# Patient Record
Sex: Female | Born: 1955 | Race: White | Hispanic: No | Smoking: Former smoker
Health system: Southern US, Community
[De-identification: ages and names within clinical notes are randomized; demographics above are authoritative.]

## PROBLEM LIST (undated history)

## (undated) DIAGNOSIS — Z87442 Personal history of urinary calculi: Secondary | ICD-10-CM

## (undated) DIAGNOSIS — R011 Cardiac murmur, unspecified: Secondary | ICD-10-CM

## (undated) DIAGNOSIS — M810 Age-related osteoporosis without current pathological fracture: Secondary | ICD-10-CM

## (undated) DIAGNOSIS — E785 Hyperlipidemia, unspecified: Secondary | ICD-10-CM

## (undated) DIAGNOSIS — R7301 Impaired fasting glucose: Secondary | ICD-10-CM

## (undated) DIAGNOSIS — I10 Essential (primary) hypertension: Secondary | ICD-10-CM

## (undated) DIAGNOSIS — M1990 Unspecified osteoarthritis, unspecified site: Secondary | ICD-10-CM

## (undated) DIAGNOSIS — Z800 Family history of malignant neoplasm of digestive organs: Secondary | ICD-10-CM

## (undated) DIAGNOSIS — F419 Anxiety disorder, unspecified: Secondary | ICD-10-CM

## (undated) DIAGNOSIS — Z85828 Personal history of other malignant neoplasm of skin: Secondary | ICD-10-CM

## (undated) DIAGNOSIS — C801 Malignant (primary) neoplasm, unspecified: Secondary | ICD-10-CM

## (undated) DIAGNOSIS — Z803 Family history of malignant neoplasm of breast: Secondary | ICD-10-CM

## (undated) DIAGNOSIS — R7303 Prediabetes: Secondary | ICD-10-CM

## (undated) DIAGNOSIS — Z973 Presence of spectacles and contact lenses: Secondary | ICD-10-CM

## (undated) DIAGNOSIS — Z801 Family history of malignant neoplasm of trachea, bronchus and lung: Secondary | ICD-10-CM

## (undated) DIAGNOSIS — N6091 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: Secondary | ICD-10-CM

## (undated) DIAGNOSIS — Z972 Presence of dental prosthetic device (complete) (partial): Secondary | ICD-10-CM

## (undated) DIAGNOSIS — Z808 Family history of malignant neoplasm of other organs or systems: Secondary | ICD-10-CM

## (undated) DIAGNOSIS — K219 Gastro-esophageal reflux disease without esophagitis: Secondary | ICD-10-CM

## (undated) DIAGNOSIS — Z8049 Family history of malignant neoplasm of other genital organs: Secondary | ICD-10-CM

## (undated) DIAGNOSIS — F329 Major depressive disorder, single episode, unspecified: Secondary | ICD-10-CM

## (undated) DIAGNOSIS — F32A Depression, unspecified: Secondary | ICD-10-CM

## (undated) DIAGNOSIS — T7840XA Allergy, unspecified, initial encounter: Secondary | ICD-10-CM

## (undated) HISTORY — DX: Family history of malignant neoplasm of other organs or systems: Z80.8

## (undated) HISTORY — DX: Gastro-esophageal reflux disease without esophagitis: K21.9

## (undated) HISTORY — DX: Major depressive disorder, single episode, unspecified: F32.9

## (undated) HISTORY — DX: Cardiac murmur, unspecified: R01.1

## (undated) HISTORY — DX: Depression, unspecified: F32.A

## (undated) HISTORY — DX: Personal history of other malignant neoplasm of skin: Z85.828

## (undated) HISTORY — PX: KNEE SURGERY: SHX244

## (undated) HISTORY — DX: Family history of malignant neoplasm of digestive organs: Z80.0

## (undated) HISTORY — PX: ABDOMINAL HYSTERECTOMY: SHX81

## (undated) HISTORY — DX: Family history of malignant neoplasm of breast: Z80.3

## (undated) HISTORY — PX: COLONOSCOPY: SHX174

## (undated) HISTORY — DX: Family history of malignant neoplasm of other genital organs: Z80.49

## (undated) HISTORY — DX: Essential (primary) hypertension: I10

## (undated) HISTORY — DX: Age-related osteoporosis without current pathological fracture: M81.0

## (undated) HISTORY — DX: Family history of malignant neoplasm of trachea, bronchus and lung: Z80.1

## (undated) HISTORY — DX: Anxiety disorder, unspecified: F41.9

## (undated) HISTORY — DX: Hyperlipidemia, unspecified: E78.5

## (undated) HISTORY — DX: Malignant (primary) neoplasm, unspecified: C80.1

## (undated) HISTORY — PX: ROTATOR CUFF REPAIR: SHX139

## (undated) HISTORY — DX: Allergy, unspecified, initial encounter: T78.40XA

---

## 2000-04-11 HISTORY — PX: OTHER SURGICAL HISTORY: SHX169

## 2003-04-12 HISTORY — PX: OTHER SURGICAL HISTORY: SHX169

## 2004-03-31 ENCOUNTER — Ambulatory Visit (HOSPITAL_COMMUNITY): Admission: RE | Admit: 2004-03-31 | Discharge: 2004-03-31 | Payer: Self-pay | Admitting: Neurosurgery

## 2004-11-10 ENCOUNTER — Ambulatory Visit: Payer: Self-pay | Admitting: Cardiology

## 2004-11-12 ENCOUNTER — Inpatient Hospital Stay (HOSPITAL_BASED_OUTPATIENT_CLINIC_OR_DEPARTMENT_OTHER): Admission: RE | Admit: 2004-11-12 | Discharge: 2004-11-12 | Payer: Self-pay | Admitting: Cardiology

## 2004-11-12 ENCOUNTER — Ambulatory Visit: Payer: Self-pay | Admitting: Cardiology

## 2004-11-18 ENCOUNTER — Ambulatory Visit: Payer: Self-pay | Admitting: Cardiology

## 2005-07-22 ENCOUNTER — Other Ambulatory Visit: Admission: RE | Admit: 2005-07-22 | Discharge: 2005-07-22 | Payer: Self-pay | Admitting: Family Medicine

## 2005-12-16 ENCOUNTER — Encounter: Admission: RE | Admit: 2005-12-16 | Discharge: 2005-12-16 | Payer: Self-pay | Admitting: Neurosurgery

## 2007-02-20 ENCOUNTER — Ambulatory Visit: Payer: Self-pay | Admitting: Gastroenterology

## 2007-03-05 ENCOUNTER — Ambulatory Visit: Payer: Self-pay | Admitting: Gastroenterology

## 2007-03-05 ENCOUNTER — Encounter: Payer: Self-pay | Admitting: Gastroenterology

## 2007-08-14 ENCOUNTER — Ambulatory Visit: Payer: Self-pay | Admitting: Cardiology

## 2008-04-11 HISTORY — PX: LAPAROSCOPIC GASTRIC BANDING: SHX1100

## 2008-04-21 ENCOUNTER — Ambulatory Visit (HOSPITAL_COMMUNITY): Admission: RE | Admit: 2008-04-21 | Discharge: 2008-04-21 | Payer: Self-pay | Admitting: *Deleted

## 2008-05-05 ENCOUNTER — Ambulatory Visit (HOSPITAL_COMMUNITY): Admission: RE | Admit: 2008-05-05 | Discharge: 2008-05-05 | Payer: Self-pay | Admitting: *Deleted

## 2008-05-22 ENCOUNTER — Encounter: Admission: RE | Admit: 2008-05-22 | Discharge: 2008-08-20 | Payer: Self-pay | Admitting: *Deleted

## 2008-07-01 ENCOUNTER — Ambulatory Visit (HOSPITAL_COMMUNITY): Admission: RE | Admit: 2008-07-01 | Discharge: 2008-07-01 | Payer: Self-pay | Admitting: *Deleted

## 2008-08-20 ENCOUNTER — Encounter: Admission: RE | Admit: 2008-08-20 | Discharge: 2008-08-20 | Payer: Self-pay | Admitting: Surgery

## 2010-06-02 ENCOUNTER — Other Ambulatory Visit: Payer: Self-pay | Admitting: Neurosurgery

## 2010-06-02 DIAGNOSIS — M549 Dorsalgia, unspecified: Secondary | ICD-10-CM

## 2010-06-02 DIAGNOSIS — M542 Cervicalgia: Secondary | ICD-10-CM

## 2010-06-06 ENCOUNTER — Ambulatory Visit
Admission: RE | Admit: 2010-06-06 | Discharge: 2010-06-06 | Disposition: A | Discharge status: Home / Self Care | Payer: BC Managed Care – PPO | Source: Ambulatory Visit | Attending: Neurosurgery | Admitting: Neurosurgery

## 2010-06-06 DIAGNOSIS — M542 Cervicalgia: Secondary | ICD-10-CM

## 2010-06-06 DIAGNOSIS — M549 Dorsalgia, unspecified: Secondary | ICD-10-CM

## 2010-07-08 ENCOUNTER — Ambulatory Visit: Payer: BC Managed Care – PPO | Attending: Neurosurgery | Admitting: Physical Therapy

## 2010-07-08 DIAGNOSIS — M542 Cervicalgia: Secondary | ICD-10-CM | POA: Insufficient documentation

## 2010-07-08 DIAGNOSIS — M2569 Stiffness of other specified joint, not elsewhere classified: Secondary | ICD-10-CM | POA: Insufficient documentation

## 2010-07-08 DIAGNOSIS — IMO0001 Reserved for inherently not codable concepts without codable children: Secondary | ICD-10-CM | POA: Insufficient documentation

## 2010-07-08 DIAGNOSIS — R5381 Other malaise: Secondary | ICD-10-CM | POA: Insufficient documentation

## 2010-07-08 DIAGNOSIS — M25519 Pain in unspecified shoulder: Secondary | ICD-10-CM | POA: Insufficient documentation

## 2010-07-13 ENCOUNTER — Ambulatory Visit: Payer: BC Managed Care – PPO | Attending: Neurosurgery | Admitting: *Deleted

## 2010-07-13 DIAGNOSIS — M25519 Pain in unspecified shoulder: Secondary | ICD-10-CM | POA: Insufficient documentation

## 2010-07-13 DIAGNOSIS — R5381 Other malaise: Secondary | ICD-10-CM | POA: Insufficient documentation

## 2010-07-13 DIAGNOSIS — IMO0001 Reserved for inherently not codable concepts without codable children: Secondary | ICD-10-CM | POA: Insufficient documentation

## 2010-07-13 DIAGNOSIS — M2569 Stiffness of other specified joint, not elsewhere classified: Secondary | ICD-10-CM | POA: Insufficient documentation

## 2010-07-13 DIAGNOSIS — M542 Cervicalgia: Secondary | ICD-10-CM | POA: Insufficient documentation

## 2010-07-15 ENCOUNTER — Ambulatory Visit: Payer: BC Managed Care – PPO | Admitting: Physical Therapy

## 2010-07-22 LAB — CBC
HCT: 37.7 % (ref 36.0–46.0)
Hemoglobin: 12.9 g/dL (ref 12.0–15.0)
MCHC: 34.3 g/dL (ref 30.0–36.0)
MCV: 91.9 fL (ref 78.0–100.0)
Platelets: 235 10*3/uL (ref 150–400)
RBC: 4.1 MIL/uL (ref 3.87–5.11)
RDW: 12.9 % (ref 11.5–15.5)
WBC: 5.2 10*3/uL (ref 4.0–10.5)

## 2010-07-22 LAB — DIFFERENTIAL
Basophils Absolute: 0 10*3/uL (ref 0.0–0.1)
Basophils Relative: 0 % (ref 0–1)
Eosinophils Absolute: 0 10*3/uL (ref 0.0–0.7)
Eosinophils Relative: 1 % (ref 0–5)
Lymphocytes Relative: 29 % (ref 12–46)
Lymphs Abs: 1.5 10*3/uL (ref 0.7–4.0)
Monocytes Absolute: 0.5 10*3/uL (ref 0.1–1.0)
Monocytes Relative: 10 % (ref 3–12)
Neutro Abs: 3.1 10*3/uL (ref 1.7–7.7)
Neutrophils Relative %: 60 % (ref 43–77)

## 2010-07-22 LAB — COMPREHENSIVE METABOLIC PANEL
ALT: 33 U/L (ref 0–35)
AST: 24 U/L (ref 0–37)
Albumin: 3 g/dL — ABNORMAL LOW (ref 3.5–5.2)
Alkaline Phosphatase: 70 U/L (ref 39–117)
BUN: 24 mg/dL — ABNORMAL HIGH (ref 6–23)
CO2: 30 mEq/L (ref 19–32)
Calcium: 9.1 mg/dL (ref 8.4–10.5)
Chloride: 104 mEq/L (ref 96–112)
Creatinine, Ser: 0.5 mg/dL (ref 0.4–1.2)
GFR calc Af Amer: >60 mL/min (ref >60)
GFR calc non Af Amer: >60 mL/min (ref >60)
Glucose, Bld: 124 mg/dL — ABNORMAL HIGH (ref 70–99)
Potassium: 3.3 mEq/L — ABNORMAL LOW (ref 3.5–5.1)
Sodium: 141 mEq/L (ref 135–145)
Total Bilirubin: 0.6 mg/dL (ref 0.3–1.2)
Total Protein: 6.9 g/dL (ref 6.0–8.3)

## 2010-07-22 LAB — HEMOGLOBIN AND HEMATOCRIT, BLOOD
HCT: 38.2 % (ref 36.0–46.0)
Hemoglobin: 12.7 g/dL (ref 12.0–15.0)

## 2010-08-24 NOTE — Op Note (Signed)
NAME:  Bridget Conway, Bridget Conway                 ACCOUNT NO.:  [id number]   MEDICAL RECORD NO.:  [id number]          PATIENT TYPE:  AMB   LOCATION:  DAY                          FACILITY:  [facility]   PHYSICIAN:  Alfonse Ras, MD   DATE OF BIRTH:  1955-09-01   DATE OF PROCEDURE:  DATE OF DISCHARGE:                               OPERATIVE REPORT   PREOPERATIVE DIAGNOSIS:  Medically refractory morbid obesity with a BMI  of 45.   POSTOPERATIVE DIAGNOSIS:  Medically refractory morbid obesity with a BMI  of 45.   PROCEDURE:  Laparoscopic adjustable gastric banding with an APL system  and subcutaneous port placement with mesh.   SURGEON:  Alfonse Ras, MD   ASSISTANT:  Lorne Skeens. Hoxworth, MD   ANESTHESIA:  General.   DESCRIPTION:  After extensive informed consent was granted both in the  office and in the preop holding, the patient was taken to the operating  room and placed in supine position.  The abdomen was prepped and draped  in normal sterile fashion after general anesthesia was induced.  Using  an 11-mm OptiVu trocar in the left upper quadrant, peritoneal access was  obtained under direct vision.  Additional 15-mm and 11-mm trocars were  placed in the right abdomen.  Under direct vision, an 11-mm trocar was  placed in the left paramedian position.  The patient was placed in the  steep head-up position and Nathanson liver retractor was placed in the  subxiphoid region and the left lateral segment of the liver was  retracted anteriorly.  The angle of Hiss was sharply and bluntly  dissected.  Using a pars flaccida technique, the lap band passer was  then placed in a retrogastric position and brought out at the angle of  Hiss.  The sizing balloon was placed down to fill with 15 mL of air and  pulled back.  There was no evidence of hiatal hernia.  An APL band was  then placed into the abdominal cavity.  It was placed in the  retrogastric position using the pars flaccida technique.  It  was placed  with the sizing tube in place and was in good position.  There was a  small amount of anterior fat over the proximal stomach which was removed  using Bovie electrocautery.  Anterior fundoplication was performed with  three interrupted 2-0 Ethilon sutures.  This put the band in good  placement.  The tubing was brought out through the lower right upper  quadrant trocar.  It had been affixed with atrium mesh on the port which  was affixed to the tubing and then placed in a subcutaneous position.  The subcutaneous tissue was closed with running 2-0 Vicryl suture.  Skin  incisions were closed with interrupted 4-0 Vicryl sutures.  Steri-Strips  and sterile dressings were applied.  All incisions had been previously  injected with 0.5 Marcaine.  The patient tolerated the procedure well.  There was minimal blood loss.  She was taken to PACU in good condition.      Alfonse Ras, MD  Electronically Signed  KRE/MEDQ  D:  07/01/2008  T:  07/01/2008  Job:  [id number]

## 2010-08-24 NOTE — Assessment & Plan Note (Signed)
[facility]                          [facility] OFFICE NOTE   ANALUISA, TUDOR                        MRN:          [id number]  DATE:08/14/2007                            DOB:          04-Jun-1955    Bridget Conway is seen for cardiac evaluation.  She was seen last in the  office in 2006.  She had been seen by Dr. Andee Lineman.  In the past she has  had some chest discomfort and there was concern that it might be angina.  She underwent catheterization.  This was done on November 12, 2004.  She  had normal LV function.  She had a very slight, minor irregularities of  the LAD.  It was recommended that she have risk factor modification and  an LDL of 100 or lower.  She has been stable since that time.   The patient mentioned that in the past she had a spider bite on her leg  and received several courses of antibiotics and has had some problems  with cutaneous yeast problems.  She had read that this might play a role  with irregular heart beats.  I discussed this with her and told her that  I was not aware of any information to say that her yeast could cause  heart beat irregularities.  She is noted some very mild localized  anterior chest pain to palpation.  This does not sound cardiac.  She  also has had some mild palpitations.  She describes this as a flip-  flop.  She does not have prolonged palpitations.  There is no syncope  or presyncope.  There is no chest pain.   PAST MEDICAL HISTORY:  Allergies:  SULFA.   Medications:  1. Hydrochlorothiazide 25 mg.  2. Zoloft 50 mg.  3. Aspirin 81 mg.  4. Multivitamin.  5. Fish oil.  6. Potassium 10 mEq.  7. She also uses garlic, cayenne,  and black cohosh.   Other medical problems:  See the list below.   SOCIAL HISTORY:  The patient works as an Environmental health practitioner.  She  stopped smoking 7 years ago.  She is not married.   FAMILY HISTORY:  There is a family history of coronary disease.   REVIEW OF  SYSTEMS:  Other than the HPI, her review of systems is  negative.   PHYSICAL EXAM:  Blood pressure today is 140/90.  She is somewhat anxious  about the visit today.  Her prior pressures have been normal.  She is oriented to person, time and place.  Affect is normal.  HEENT:  No xanthelasma.  She has normal extraocular motion.  There are no carotid bruits.  There is no jugular venous distention.  LUNGS:  Clear.  Respiratory effort is not labored.  CARDIAC:  An S1 with an S2.  There are no clicks or significant murmurs.  ABDOMEN:  Soft but obese.  She has no peripheral edema.  There are no musculoskeletal deformities.   EKG dated Aug 10, 2007, had been sent to [place] as part of the outside  workup.  There is a  question of an RSR prime in V1 with question of  incomplete right bundle branch block.  This is a borderline call.   PROBLEMS:  1. Hypertension, treated.  2. History of cigarette smoking.  Stopped 7 years ago.  3. SULFA allergy.  4. Obesity.  She clearly needs to lose weight.  5. History of a significant spider bite treated in the past with      antibiotics.  6. Question of some cutaneous yeast problems.  7. Localized anterior chest pain which is musculoskeletal.  *8.  A sensation of palpitations feeling like a flip-flop.  I suspect  that this is from PACs.  The patient had a Holter that she wore at  [facility].  He was just turned in yesterday morning and we do  not have the results yet.  I will wait for the result.  I have told the  patient I would call her back on Thursday afternoon, Aug 16, 2007, when I  am back in the [place] office and we have at this Holter reviewed.  In the  meantime, she should go about full activities.  I doubt that there is a  significant abnormality.  We will be back in touch with her.     Bridget Abed, MD, [facility]  Electronically Signed    JDK/MedQ  DD: 08/14/2007  DT: 08/14/2007  Job #: [id number]   cc:   Ernestina Penna, M.D.

## 2010-08-27 NOTE — Cardiovascular Report (Signed)
Bridget Conway, Bridget Conway                 ACCOUNT NO.:  [id number]   MEDICAL RECORD NO.:  [id number]          PATIENT TYPE:  OIB   LOCATION:  6501                         FACILITY:  [facility]   PHYSICIAN:  Arturo Morton. Riley Kill, M.D. [facility] OF BIRTH:  [birth year]   DATE OF PROCEDURE:  11/12/2004  DATE OF DISCHARGE:                              CARDIAC CATHETERIZATION   INDICATIONS:  [name] is a 55 year old who has had chest discomfort.  The  current study was done to assess coronary anatomy.  She was seen by Dr.  Andee Lineman and set up for outpatient catheterization.  Her potassium was low at  3.1  The current study was done to assess coronary anatomy.   PROCEDURES:  1.  Left heart catheterization.  2.  Selective coronary arteriography.  3.  Selective left ventriculography.   DESCRIPTION OF PROCEDURE:  The patient was brought to the catheterization  lab and prepped and draped in the usual fashion.  Because of the size of the patient, there was some difficulty gaining access  to the right femoral artery.  We ended up using a Smart needle.  We were then able to successfully engage,  a 4-French sheath was then placed.  View of the left and right coronaries  were obtained in several angiographic projections.  Central aortic and left ventricular pressures were measured with the  pigtail.  Ventriculography was formed in the RAO projection.  Following  pressure pullback, the pigtail catheter was removed.  The patient was taken to the holding area in satisfactory clinical  condition.   HEMODYNAMIC DATA.:  1.  Central aortic pressure 108/69, mean 88.  2.  Left ventricular pressure 113/13.  3.  No gradient on pullback across the aortic valve.   ANGIOGRAPHIC DATA.:  1.  Ventriculography was formed in the RAO projection.  Overall systolic      function was preserved.  No definite wall motion abnormalities were      identified.  2.  The right coronary artery is a large-caliber vessel providing a  major      posterior descending branch and two small posterolateral branches.  The      RCA and its branches appear to be free of significant disease.  3.  The left main is a fairly large-caliber vessel that appears free of      significant disease.  4.  The left anterior descending artery courses to the apex.  There is a      bend in the LAD.  It provides two diagonal branches and a major septal      perforator.  After the septal perforator, there is in the area of the      bend about a 20% eccentric luminal plaquing.  No areas of high-grade      stenosis are noted.  The distal LAD wraps the apical tip and has a large      diagonal branch distally.  5.  The circumflex consists of predominantly one major marginal branch that      bifurcates distally.  The marginal is free of critical disease.  CONCLUSIONS:  1.  Well-preserved left ventricular function.  2.  Minor irregularity of the left anterior descending coronary artery but      no other areas of high-grade stenosis.   DISPOSITION:  The patient needs low-dose potassium.  She will follow up with  Dr. Andee Lineman in the office [facility].       TDS/MEDQ  D:  11/12/2004  T:  11/13/2004  Job:  [id number]   cc:   Learta Codding, M.D. [facility]  [address]  [address]  [place]  [place] [address]   Paulene Floor, N.P.  [facility]  [phone] [address], [place]

## 2010-08-27 NOTE — Assessment & Plan Note (Signed)
[facility]                          [facility]ICE NOTE   KAREN, HUHTA                        MRN:          [id number]  DATE:08/16/2007                            DOB:          1956/02/15    The patient was seen in the office on Aug 14, 2007.  We have just  received the copy of the Holter monitor sent from [facility].  The study shows sinus rhythm with scattered PACs.  I  will communicate this information to the patient.     Luis Abed, MD, [facility]  Electronically Signed    JDK/MedQ  DD: 08/16/2007  DT: 08/16/2007  Job #: [id number]

## 2010-08-27 NOTE — Op Note (Signed)
Bridget Conway, Bridget Conway                 ACCOUNT NO.:  [id number]   MEDICAL RECORD NO.:  [id number]          PATIENT TYPE:  OIB   LOCATION:  NA                           FACILITY:  [facility]   PHYSICIAN:  Hilda Lias, M.D.   DATE OF BIRTH:  [birth year]   DATE OF PROCEDURE:  03/31/2004  DATE OF DISCHARGE:                                 OPERATIVE REPORT   PREOPERATIVE DIAGNOSIS:  Right carpal tunnel syndrome.   POSTOPERATIVE DIAGNOSIS:  Right carpal tunnel syndrome.   PROCEDURE:  Decompression of the right median nerve.   ANESTHESIA:  IV sedation plus local.   CLINICAL HISTORY:  Ms. Scoggin is being admitted because of weakness of the  right hand associated with numbness.  The nerve conduction test was positive  for carpal tunnel syndrome.  Surgery was advised.  The risks were explained  including the possibility of __________, damage to the nerve, no improvement  whatsoever, need of further surgery.   PROCEDURE:  The patient was taken to the OR and the right arm and hand were  prepped with Betadine.  Drapes were applied.  Infiltration with Xylocaine  was made along the base of the thumb.  Then incision along the base of the  thumb was made through the skin, volar ligament and through a thick carpal  ligament.  Decompression medial and distal was made along the ulnar arches  of the nerve.  Having good decompression, the patient was able to move the  fingers including the thumb.  Hemostasis was done with bipolar.  From then  on, the area was irrigated and closed with nylon.  The patient did well and  she is going to be going home once she is stable, to be followed by me in my  office.       EB/MEDQ  D:  03/31/2004  T:  04/01/2004  Job:  [id number]

## 2011-01-06 ENCOUNTER — Telehealth (INDEPENDENT_AMBULATORY_CARE_PROVIDER_SITE_OTHER): Payer: Self-pay | Admitting: General Surgery

## 2011-01-06 NOTE — Telephone Encounter (Signed)
01/06/11 Recall letter mailed to patient for bariatric surgery follow-up.  Adv pt to call to schedule appt...cef °

## 2011-12-30 ENCOUNTER — Telehealth (INDEPENDENT_AMBULATORY_CARE_PROVIDER_SITE_OTHER): Payer: Self-pay | Admitting: Surgery

## 2011-12-30 NOTE — Telephone Encounter (Signed)
Left message on voicemail stating the patient needs to call [facility] at [phone] to schedule a follow-up appointment....cef °

## 2012-02-10 ENCOUNTER — Encounter: Payer: Self-pay | Admitting: Gastroenterology

## 2012-03-28 ENCOUNTER — Encounter: Payer: Self-pay | Admitting: Gastroenterology

## 2012-04-11 DIAGNOSIS — R7301 Impaired fasting glucose: Secondary | ICD-10-CM

## 2012-04-11 HISTORY — DX: Impaired fasting glucose: R73.01

## 2012-04-26 ENCOUNTER — Ambulatory Visit (AMBULATORY_SURGERY_CENTER): Payer: BC Managed Care – PPO | Admitting: *Deleted

## 2012-04-26 ENCOUNTER — Encounter: Payer: Self-pay | Admitting: Gastroenterology

## 2012-04-26 VITALS — Ht 65.0 in | Wt 280.0 lb

## 2012-04-26 DIAGNOSIS — Z860100 Personal history of colon polyps, unspecified: Secondary | ICD-10-CM

## 2012-04-26 DIAGNOSIS — Z1211 Encounter for screening for malignant neoplasm of colon: Secondary | ICD-10-CM

## 2012-04-26 MED ORDER — PEG-KCL-NACL-NASULF-NA ASC-C 100 G PO SOLR: ORAL | Status: DC

## 2012-05-08 ENCOUNTER — Encounter: Payer: Self-pay | Admitting: Gastroenterology

## 2012-05-08 ENCOUNTER — Ambulatory Visit (AMBULATORY_SURGERY_CENTER): Payer: BC Managed Care – PPO | Admitting: Gastroenterology

## 2012-05-08 VITALS — BP 107/56 | HR 85 | Temp 97.7°F | Resp 19 | Ht 65.0 in | Wt 280.0 lb

## 2012-05-08 DIAGNOSIS — Z86010 Personal history of colonic polyps: Secondary | ICD-10-CM

## 2012-05-08 DIAGNOSIS — K5730 Diverticulosis of large intestine without perforation or abscess without bleeding: Secondary | ICD-10-CM

## 2012-05-08 DIAGNOSIS — Z1211 Encounter for screening for malignant neoplasm of colon: Secondary | ICD-10-CM

## 2012-05-08 MED ORDER — SODIUM CHLORIDE 0.9 % IV SOLN: 500.0000 mL | INTRAVENOUS | Status: DC

## 2012-05-08 NOTE — Progress Notes (Signed)
Patient did not experience any of the following events: a burn prior to discharge; a fall within the facility; wrong site/side/patient/procedure/implant event; or a hospital transfer or hospital admission upon discharge from the facility. (G8907)Patient did not have preoperative order for IV antibiotic SSI prophylaxis. (G8918) ewm 

## 2012-05-08 NOTE — Patient Instructions (Addendum)
YOU HAD AN ENDOSCOPIC PROCEDURE TODAY AT THE [facility]: Refer to the procedure report that was given to you for any specific questions about what was found during the examination.  If the procedure report does not answer your questions, please call your gastroenterologist to clarify.  If you requested that your care partner not be given the details of your procedure findings, then the procedure report has been included in a sealed envelope for you to review at your convenience later.  YOU SHOULD EXPECT: Some feelings of bloating in the abdomen. Passage of more gas than usual.  Walking can help get rid of the air that was put into your GI tract during the procedure and reduce the bloating. If you had a lower endoscopy (such as a colonoscopy or flexible sigmoidoscopy) you may notice spotting of blood in your stool or on the toilet paper. If you underwent a bowel prep for your procedure, then you may not have a normal bowel movement for a few days.  DIET: Your first meal following the procedure should be a light meal and then it is ok to progress to your normal diet.  A half-sandwich or bowl of soup is an example of a good first meal.  Heavy or fried foods are harder to digest and may make you feel nauseous or bloated.  Likewise meals heavy in dairy and vegetables can cause extra gas to form and this can also increase the bloating.  Drink plenty of fluids but you should avoid alcoholic beverages for 24 hours.  ACTIVITY: Your care partner should take you home directly after the procedure.  You should plan to take it easy, moving slowly for the rest of the day.  You can resume normal activity the day after the procedure however you should NOT DRIVE or use heavy machinery for 24 hours (because of the sedation medicines used during the test).    SYMPTOMS TO REPORT IMMEDIATELY: A gastroenterologist can be reached at any hour.  During normal business hours, 8:30 AM to 5:00 PM Monday through Friday,  call [phone].  After hours and on weekends, please call the GI answering service at [phone] emergency number  who will take a message and have the physician on call contact you.   Following lower endoscopy (colonoscopy or flexible sigmoidoscopy):  Excessive amounts of blood in the stool  Significant tenderness or worsening of abdominal pains  Swelling of the abdomen that is new, acute  Fever of 100F or higher  FOLLOW UP: If any biopsies were taken you will be contacted by phone or by letter within the next 1-3 weeks.  Call your gastroenterologist if you have not heard about the biopsies in 3 weeks.  Our staff will call the home number listed on your records the next business day following your procedure to check on you and address any questions or concerns that you may have at that time regarding the information given to you following your procedure. This is a courtesy call and so if there is no answer at the home number and we have not heard from you through the emergency physician on call, we will assume that you have returned to your regular daily activities without incident.  SIGNATURES/CONFIDENTIALITY: You and/or your care partner have signed paperwork which will be entered into your electronic medical record.  These signatures attest to the fact that that the information above on your After Visit Summary has been reviewed and is understood.  Full responsibility of the   confidentiality of this discharge information lies with you and/or your care-partner.   Handouts on diverticulosis and high fiber diet Repeat colon in 10 years

## 2012-05-08 NOTE — Op Note (Signed)
[facility] [address] [place], [address]   COLONOSCOPY PROCEDURE REPORT  PATIENT: Bridget Conway, Bridget Conway.  MR#: [id number] BIRTHDATE: [birth year] , 56  yrs. old GENDER: Female ENDOSCOPIST: Rachael Fee, MD PROCEDURE DATE:  05/08/2012 PROCEDURE:   Colonoscopy, surveillance ASA CLASS:   Class III INDICATIONS: single small adenoma removed 2008. MEDICATIONS: Fentanyl 75 mcg IV, Versed 6 mg IV, and These medications were titrated to patient response per physician's verbal order  DESCRIPTION OF PROCEDURE:   After the risks benefits and alternatives of the procedure were thoroughly explained, informed consent was obtained.  A digital rectal exam revealed no abnormalities of the rectum.   The LB [id number]-[id number]  endoscope was introduced through the anus and advanced to the cecum, which was identified by both the appendix and ileocecal valve. No adverse events experienced.   The quality of the prep was good, using MoviPrep  The instrument was then slowly withdrawn as the colon was fully examined.  COLON FINDINGS: There were a few small diverticulum in left colon. The examination was otherwise normal.  No polyps or cancers. Retroflexed views revealed no abnormalities. The time to cecum=6 minutes 29 seconds.  Withdrawal time=6 minutes 19 seconds.  The scope was withdrawn and the procedure completed. COMPLICATIONS: There were no complications.  ENDOSCOPIC IMPRESSION: There were a few small diverticulum in left colon. The examination was otherwise normal. No polyps or cancers.  RECOMMENDATIONS: You should continue to follow colorectal cancer screening guidelines for "routine risk" patients with a repeat colonoscopy in 10 years. There is no need for FOBT (stool) testing for at least 5 years.   eSigned:  Rachael Fee, MD 05/08/2012 8:43 AM   cc: Rudi Heap, MD

## 2012-05-08 NOTE — Progress Notes (Signed)
The pt tolerated the colonoscopy very well. Maw   

## 2012-05-09 ENCOUNTER — Telehealth: Payer: Self-pay | Admitting: *Deleted

## 2012-05-09 NOTE — Telephone Encounter (Signed)
  Follow up Call-  Call back number 05/08/2012  Post procedure Call Back phone  # [phone]  Permission to leave phone message Yes     Patient questions:  Do you have a fever, pain , or abdominal swelling? no Pain Score  0 *  Have you tolerated food without any problems? yes  Have you been able to return to your normal activities? yes at work per pt.  Do you have any questions about your discharge instructions: Diet   no Medications  no Follow up visit  no  Do you have questions or concerns about your Care? no  Actions: * If pain score is 4 or above: No action needed, pain <4.

## 2013-07-05 ENCOUNTER — Other Ambulatory Visit: Payer: Self-pay | Admitting: Orthopedic Surgery

## 2013-07-09 ENCOUNTER — Encounter (HOSPITAL_BASED_OUTPATIENT_CLINIC_OR_DEPARTMENT_OTHER): Payer: Self-pay | Admitting: *Deleted

## 2013-07-09 NOTE — Progress Notes (Signed)
Pt denies sleep apnea-cannot get labs preop-will need istat and ekg-

## 2013-07-09 NOTE — Progress Notes (Signed)
07/09/13 1357  OBSTRUCTIVE SLEEP APNEA  Have you ever been diagnosed with sleep apnea through a sleep study? No  Do you snore loudly (loud enough to be heard through closed doors)?  0  Do you often feel tired, fatigued, or sleepy during the daytime? 0  Has anyone observed you stop breathing during your sleep? 0  Do you have, or are you being treated for high blood pressure? 1  BMI more than 35 kg/m2? 1  Age over 60 years old? 1  Neck circumference greater than 40 cm/18 inches? 1  Gender: 0  Obstructive Sleep Apnea Score 4  Score 4 or greater  Results sent to PCP

## 2013-07-11 ENCOUNTER — Encounter (HOSPITAL_BASED_OUTPATIENT_CLINIC_OR_DEPARTMENT_OTHER)
Admission: RE | Disposition: A | Discharge status: Home / Self Care | Payer: BC Managed Care – PPO | Source: Ambulatory Visit | Attending: Orthopedic Surgery

## 2013-07-11 ENCOUNTER — Encounter (HOSPITAL_BASED_OUTPATIENT_CLINIC_OR_DEPARTMENT_OTHER): Payer: BC Managed Care – PPO | Admitting: Anesthesiology

## 2013-07-11 ENCOUNTER — Encounter (HOSPITAL_BASED_OUTPATIENT_CLINIC_OR_DEPARTMENT_OTHER): Payer: Self-pay | Admitting: Anesthesiology

## 2013-07-11 ENCOUNTER — Ambulatory Visit (HOSPITAL_BASED_OUTPATIENT_CLINIC_OR_DEPARTMENT_OTHER): Payer: BC Managed Care – PPO | Admitting: Anesthesiology

## 2013-07-11 ENCOUNTER — Ambulatory Visit (HOSPITAL_BASED_OUTPATIENT_CLINIC_OR_DEPARTMENT_OTHER)
Admission: RE | Admit: 2013-07-11 | Discharge: 2013-07-11 | Disposition: A | Discharge status: Home / Self Care | Payer: BC Managed Care – PPO | Source: Ambulatory Visit | Attending: Orthopedic Surgery | Admitting: Orthopedic Surgery

## 2013-07-11 DIAGNOSIS — F3289 Other specified depressive episodes: Secondary | ICD-10-CM | POA: Insufficient documentation

## 2013-07-11 DIAGNOSIS — Z87891 Personal history of nicotine dependence: Secondary | ICD-10-CM | POA: Insufficient documentation

## 2013-07-11 DIAGNOSIS — G5600 Carpal tunnel syndrome, unspecified upper limb: Secondary | ICD-10-CM | POA: Insufficient documentation

## 2013-07-11 DIAGNOSIS — F411 Generalized anxiety disorder: Secondary | ICD-10-CM | POA: Insufficient documentation

## 2013-07-11 DIAGNOSIS — I10 Essential (primary) hypertension: Secondary | ICD-10-CM | POA: Insufficient documentation

## 2013-07-11 DIAGNOSIS — Z85828 Personal history of other malignant neoplasm of skin: Secondary | ICD-10-CM | POA: Insufficient documentation

## 2013-07-11 DIAGNOSIS — F329 Major depressive disorder, single episode, unspecified: Secondary | ICD-10-CM | POA: Insufficient documentation

## 2013-07-11 DIAGNOSIS — K219 Gastro-esophageal reflux disease without esophagitis: Secondary | ICD-10-CM | POA: Insufficient documentation

## 2013-07-11 DIAGNOSIS — E119 Type 2 diabetes mellitus without complications: Secondary | ICD-10-CM | POA: Insufficient documentation

## 2013-07-11 DIAGNOSIS — M810 Age-related osteoporosis without current pathological fracture: Secondary | ICD-10-CM | POA: Insufficient documentation

## 2013-07-11 HISTORY — DX: Presence of spectacles and contact lenses: Z97.3

## 2013-07-11 HISTORY — DX: Presence of dental prosthetic device (complete) (partial): Z97.2

## 2013-07-11 HISTORY — PX: CARPAL TUNNEL RELEASE: SHX101

## 2013-07-11 HISTORY — DX: Impaired fasting glucose: R73.01

## 2013-07-11 LAB — POCT I-STAT, CHEM 8
BUN: 21 mg/dL (ref 6–23)
Calcium, Ion: 1.13 mmol/L (ref 1.13–1.30)
Chloride: 99 mEq/L (ref 96–112)
Creatinine, Ser: 0.7 mg/dL (ref 0.50–1.10)
Glucose, Bld: 115 mg/dL — ABNORMAL HIGH (ref 70–99)
HCT: 43 % (ref 36.0–46.0)
HEMOGLOBIN: 14.6 g/dL (ref 12.0–15.0)
Potassium: 3.3 mEq/L — ABNORMAL LOW (ref 3.7–5.3)
SODIUM: 142 meq/L (ref 137–147)
TCO2: 28 mmol/L (ref 0–100)

## 2013-07-11 SURGERY — CARPAL TUNNEL RELEASE
Anesthesia: Regional | Site: Wrist | Laterality: Left

## 2013-07-11 MED ORDER — FENTANYL CITRATE 0.05 MG/ML IJ SOLN: 25.0000 ug | INTRAMUSCULAR | Status: DC | PRN

## 2013-07-11 MED ORDER — CEFAZOLIN SODIUM-DEXTROSE 2-3 GM-% IV SOLR
INTRAVENOUS | Status: AC
  Filled 2013-07-11: qty 50

## 2013-07-11 MED ORDER — METOCLOPRAMIDE HCL 5 MG/ML IJ SOLN: 10.0000 mg | Freq: Once | INTRAMUSCULAR | Status: DC | PRN

## 2013-07-11 MED ORDER — OXYCODONE HCL 5 MG PO TABS
ORAL_TABLET | ORAL | Status: AC
  Filled 2013-07-11: qty 1

## 2013-07-11 MED ORDER — FENTANYL CITRATE 0.05 MG/ML IJ SOLN: 50.0000 ug | INTRAMUSCULAR | Status: DC | PRN

## 2013-07-11 MED ORDER — METOCLOPRAMIDE HCL 5 MG/ML IJ SOLN
INTRAMUSCULAR | Status: DC | PRN
  Administered 2013-07-11: 10 mg via INTRAVENOUS

## 2013-07-11 MED ORDER — CEFAZOLIN SODIUM 1-5 GM-% IV SOLN
INTRAVENOUS | Status: AC
  Filled 2013-07-11: qty 50

## 2013-07-11 MED ORDER — MIDAZOLAM HCL 2 MG/2ML IJ SOLN
INTRAMUSCULAR | Status: AC
  Filled 2013-07-11: qty 2

## 2013-07-11 MED ORDER — FENTANYL CITRATE 0.05 MG/ML IJ SOLN
INTRAMUSCULAR | Status: DC | PRN
  Administered 2013-07-11: 50 ug via INTRAVENOUS

## 2013-07-11 MED ORDER — DEXTROSE 5 % IV SOLN
3.0000 g | INTRAVENOUS | Status: AC
  Administered 2013-07-11: 3 g via INTRAVENOUS

## 2013-07-11 MED ORDER — OXYCODONE-ACETAMINOPHEN 5-325 MG PO TABS: ORAL_TABLET | ORAL | Status: DC

## 2013-07-11 MED ORDER — OXYCODONE HCL 5 MG/5ML PO SOLN: 5.0000 mg | Freq: Once | ORAL | Status: AC | PRN

## 2013-07-11 MED ORDER — MIDAZOLAM HCL 5 MG/5ML IJ SOLN
INTRAMUSCULAR | Status: DC | PRN
  Administered 2013-07-11: 1 mg via INTRAVENOUS

## 2013-07-11 MED ORDER — MIDAZOLAM HCL 2 MG/2ML IJ SOLN: 1.0000 mg | INTRAMUSCULAR | Status: DC | PRN

## 2013-07-11 MED ORDER — DEXAMETHASONE SODIUM PHOSPHATE 10 MG/ML IJ SOLN
INTRAMUSCULAR | Status: DC | PRN
  Administered 2013-07-11: 10 mg via INTRAVENOUS

## 2013-07-11 MED ORDER — BUPIVACAINE HCL (PF) 0.25 % IJ SOLN
INTRAMUSCULAR | Status: DC | PRN
  Administered 2013-07-11: 10 mL

## 2013-07-11 MED ORDER — FENTANYL CITRATE 0.05 MG/ML IJ SOLN
INTRAMUSCULAR | Status: AC
Start: 2013-07-11 — End: 2013-07-11
  Filled 2013-07-11: qty 4

## 2013-07-11 MED ORDER — BUPIVACAINE HCL (PF) 0.25 % IJ SOLN
INTRAMUSCULAR | Status: AC
  Filled 2013-07-11: qty 30

## 2013-07-11 MED ORDER — LIDOCAINE HCL (CARDIAC) 20 MG/ML IV SOLN
INTRAVENOUS | Status: DC | PRN
  Administered 2013-07-11: 50 mg via INTRAVENOUS

## 2013-07-11 MED ORDER — PROPOFOL 10 MG/ML IV BOLUS
INTRAVENOUS | Status: DC | PRN
  Administered 2013-07-11: 300 mg via INTRAVENOUS

## 2013-07-11 MED ORDER — CHLORHEXIDINE GLUCONATE 4 % EX LIQD: 60.0000 mL | Freq: Once | CUTANEOUS | Status: DC

## 2013-07-11 MED ORDER — ONDANSETRON HCL 4 MG/2ML IJ SOLN
INTRAMUSCULAR | Status: DC | PRN
  Administered 2013-07-11: 4 mg via INTRAVENOUS

## 2013-07-11 MED ORDER — LACTATED RINGERS IV SOLN
INTRAVENOUS | Status: DC
  Administered 2013-07-11: 07:00:00 via INTRAVENOUS

## 2013-07-11 MED ORDER — OXYCODONE HCL 5 MG PO TABS
5.0000 mg | ORAL_TABLET | Freq: Once | ORAL | Status: AC | PRN
  Administered 2013-07-11: 5 mg via ORAL

## 2013-07-11 SURGICAL SUPPLY — 39 items
BANDAGE ELASTIC 3 VELCRO ST LF (GAUZE/BANDAGES/DRESSINGS) ×3 IMPLANT
BLADE MINI RND TIP GREEN BEAV (BLADE) IMPLANT
BLADE SURG 15 STRL LF DISP TIS (BLADE) ×2 IMPLANT
BLADE SURG 15 STRL SS (BLADE) ×6
BNDG CMPR 9X4 STRL LF SNTH (GAUZE/BANDAGES/DRESSINGS) ×1
BNDG ESMARK 4X9 LF (GAUZE/BANDAGES/DRESSINGS) ×2 IMPLANT
BNDG GAUZE ELAST 4 BULKY (GAUZE/BANDAGES/DRESSINGS) ×3 IMPLANT
CHLORAPREP W/TINT 26ML (MISCELLANEOUS) ×3 IMPLANT
CORDS BIPOLAR (ELECTRODE) ×3 IMPLANT
COVER MAYO STAND STRL (DRAPES) ×3 IMPLANT
COVER TABLE BACK 60X90 (DRAPES) ×3 IMPLANT
CUFF TOURNIQUET SINGLE 18IN (TOURNIQUET CUFF) ×1 IMPLANT
CUFF TOURNIQUET SINGLE 24IN (TOURNIQUET CUFF) ×2 IMPLANT
DRAPE EXTREMITY T 121X128X90 (DRAPE) ×3 IMPLANT
DRAPE SURG 17X23 STRL (DRAPES) ×3 IMPLANT
DRSG PAD ABDOMINAL 8X10 ST (GAUZE/BANDAGES/DRESSINGS) ×5 IMPLANT
GAUZE XEROFORM 1X8 LF (GAUZE/BANDAGES/DRESSINGS) ×3 IMPLANT
GLOVE BIO SURGEON STRL SZ7.5 (GLOVE) ×3 IMPLANT
GLOVE BIOGEL PI IND STRL 7.0 (GLOVE) IMPLANT
GLOVE BIOGEL PI IND STRL 8 (GLOVE) ×1 IMPLANT
GLOVE BIOGEL PI INDICATOR 7.0 (GLOVE) ×2
GLOVE BIOGEL PI INDICATOR 8 (GLOVE) ×2
GLOVE ECLIPSE 7.0 STRL STRAW (GLOVE) ×2 IMPLANT
GOWN STRL REUS W/ TWL LRG LVL3 (GOWN DISPOSABLE) ×1 IMPLANT
GOWN STRL REUS W/TWL LRG LVL3 (GOWN DISPOSABLE) ×3
GOWN STRL REUS W/TWL XL LVL3 (GOWN DISPOSABLE) ×3 IMPLANT
NDL HYPO 25X1 1.5 SAFETY (NEEDLE) IMPLANT
NEEDLE HYPO 25X1 1.5 SAFETY (NEEDLE) ×3 IMPLANT
NS IRRIG 1000ML POUR BTL (IV SOLUTION) ×3 IMPLANT
PACK BASIN DAY SURGERY FS (CUSTOM PROCEDURE TRAY) ×3 IMPLANT
PADDING CAST ABS 4INX4YD NS (CAST SUPPLIES) ×2
PADDING CAST ABS COTTON 4X4 ST (CAST SUPPLIES) ×1 IMPLANT
SPONGE GAUZE 4X4 12PLY (GAUZE/BANDAGES/DRESSINGS) ×3 IMPLANT
STOCKINETTE 4X48 STRL (DRAPES) ×3 IMPLANT
SUT ETHILON 4 0 PS 2 18 (SUTURE) ×3 IMPLANT
SYR BULB 3OZ (MISCELLANEOUS) ×3 IMPLANT
SYR CONTROL 10ML LL (SYRINGE) ×2 IMPLANT
TOWEL OR 17X24 6PK STRL BLUE (TOWEL DISPOSABLE) ×6 IMPLANT
UNDERPAD 30X30 INCONTINENT (UNDERPADS AND DIAPERS) ×3 IMPLANT

## 2013-07-11 NOTE — Op Note (Signed)
[id number] 

## 2013-07-11 NOTE — H&P (Signed)
Bridget Conway is an 58 y.o. female.   Chief Complaint: left carpal tunnel syndrome HPI: 58 yo rhd female with pain and numbness left hand x 1 year.  Pins and needles sensation.  Nocturnal symptoms nightly that wake her.  Positive nerve conduction studies.  She wishes to have a left carpal tunnel release.  Past Medical History  Diagnosis Date  . Allergy   . Cancer     basal cell CA- below eye  . Anxiety   . Depression   . Hypertension   . GERD (gastroesophageal reflux disease)   . Osteoporosis   . Wears glasses   . Wears partial dentures     upper partial  . IFG (impaired fasting glucose) 2014    Past Surgical History  Procedure Laterality Date  . Carpel tunnel  2005    right wrist  . Hysterectomy  2002  . Laparoscopic gastric banding  2010  . Colonoscopy      Family History  Problem Relation Age of Onset  . Colon cancer Neg Hx   . Esophageal cancer Neg Hx   . Rectal cancer Neg Hx   . Stomach cancer Neg Hx    Social History:  reports that she quit smoking about 13 years ago. She has never used smokeless tobacco. She reports that she does not drink alcohol or use illicit drugs.  Allergies:  Allergies  Allergen Reactions  . Sulfa Antibiotics Hives    Medications Prior to Admission  Medication Sig Dispense Refill  . Cholecalciferol (VITAMIN D PO) Take 1,000 Units by mouth daily.      . fish oil-omega-3 fatty acids 1000 MG capsule Take 1,200 mg by mouth daily.      . Flaxseed, Linseed, (FLAXSEED OIL PO) Take 2,400 mg by mouth daily.      . fluticasone (FLONASE) 50 MCG/ACT nasal spray       . hydrochlorothiazide (HYDRODIURIL) 25 MG tablet Take 25 mg by mouth daily.      Marland Kitchen MELATONIN ER PO Take by mouth daily.      . meloxicam (MOBIC) 15 MG tablet Take 15 mg by mouth daily.      . metFORMIN (GLUCOPHAGE) 500 MG tablet Take by mouth every evening.      . Multiple Vitamins-Minerals (MULTIVITAMIN PO) Take by mouth daily.      Marland Kitchen POTASSIUM PO Take by mouth daily.      .  Probiotic Product (PROBIOTIC DAILY PO) Take by mouth daily.      Marland Kitchen RANITIDINE HCL PO Take by mouth daily.      . sertraline (ZOLOFT) 50 MG tablet Take 50 mg by mouth daily.        No results found for this or any previous visit (from the past 48 hour(s)).  No results found.   A comprehensive review of systems was negative except for: Eyes: positive for contacts/glasses Behavioral/Psych: positive for depression  Blood pressure 114/82, pulse 76, temperature 97.8 F (36.6 C), temperature source Oral, resp. rate 16, height 5\' 5"  (1.651 m), weight 126.78 kg (279 lb 8 oz), SpO2 96.00%.  General appearance: alert, cooperative and appears stated age Head: Normocephalic, without obvious abnormality, atraumatic Neck: supple, symmetrical, trachea midline Resp: clear to auscultation bilaterally Cardio: regular rate and rhythm GI: non tender Extremities: intact sensation and carpillary refill all digits.  +epl/fpl/io.  no wounds. Pulses: 2+ and symmetric Skin: Skin color, texture, turgor normal. No rashes or lesions Neurologic: Grossly normal Incision/Wound: none  Assessment/Plan Left carpal tunnel  syndrome.  Non operative and operative treatment options were discussed with the patient and patient wishes to proceed with operative treatment. Risks, benefits, and alternatives of surgery were discussed and the patient agrees with the plan of care.   Bridget Conway R 07/11/2013, 8:21 AM

## 2013-07-11 NOTE — Transfer of Care (Signed)
Immediate Anesthesia Transfer of Care Note  Patient: Bridget Conway  Procedure(s) Performed: Procedure(s): LEFT CARPAL TUNNEL RELEASE (Left)  Patient Location: PACU  Anesthesia Type:General  Level of Consciousness: awake, alert  and oriented  Airway & Oxygen Therapy: Patient Spontanous Breathing and Patient connected to face mask oxygen  Post-op Assessment: Report given to PACU RN and Post -op Vital signs reviewed and stable  Post vital signs: Reviewed and stable  Complications: No apparent anesthesia complications

## 2013-07-11 NOTE — Brief Op Note (Signed)
07/11/2013  9:40 AM  PATIENT:  [name]  58 y.o. female  PRE-OPERATIVE DIAGNOSIS:  LEFT CARPAL TUNNEL SYNDROME  POST-OPERATIVE DIAGNOSIS:  LEFT CARPAL TUNNEL SYNDROME  PROCEDURE:  Procedure(s): LEFT CARPAL TUNNEL RELEASE (Left)  SURGEON:  Surgeon(s) and Role:    * Tennis Must, MD - Primary  PHYSICIAN ASSISTANT:   ASSISTANTS: none   ANESTHESIA:   general  EBL:  Total I/O In: 1000 [I.V.:1000] Out: -   BLOOD ADMINISTERED:none  DRAINS: none   LOCAL MEDICATIONS USED:  MARCAINE     SPECIMEN:  No Specimen  DISPOSITION OF SPECIMEN:  N/A  COUNTS:  YES  TOURNIQUET:   Total Tourniquet Time Documented: Upper Arm (Left) - 14 minutes Total: Upper Arm (Left) - 14 minutes   DICTATION: .Other Dictation: Dictation Number [id number]  PLAN OF CARE: Discharge to home after PACU  PATIENT DISPOSITION:  PACU - hemodynamically stable.

## 2013-07-11 NOTE — Anesthesia Preprocedure Evaluation (Signed)
Anesthesia Evaluation  Patient identified by MRN, date of birth, ID band Patient awake    Reviewed: Allergy & Precautions, H&P , NPO status , Patient's Chart, lab work & pertinent test results, reviewed documented beta blocker date and time   Airway Mallampati: II TM Distance: >3 FB Neck ROM: full    Dental   Pulmonary neg pulmonary ROS, former smoker,  breath sounds clear to auscultation        Cardiovascular hypertension, On Medications Rhythm:regular     Neuro/Psych PSYCHIATRIC DISORDERS Anxiety Depression negative neurological ROS     GI/Hepatic Neg liver ROS, GERD-  Medicated,  Endo/Other  diabetes, Oral Hypoglycemic AgentsMorbid obesity  Renal/GU negative Renal ROS  negative genitourinary   Musculoskeletal   Abdominal   Peds  Hematology negative hematology ROS (+)   Anesthesia Other Findings See surgeon's H&P   Reproductive/Obstetrics negative OB ROS                           Anesthesia Physical Anesthesia Plan  ASA: III  Anesthesia Plan: General and Bier Block   Post-op Pain Management:    Induction: Intravenous  Airway Management Planned: Mask and LMA  Additional Equipment:   Intra-op Plan:   Post-operative Plan:   Informed Consent: I have reviewed the patients History and Physical, chart, labs and discussed the procedure including the risks, benefits and alternatives for the proposed anesthesia with the patient or authorized representative who has indicated his/her understanding and acceptance.   Dental Advisory Given  Plan Discussed with: CRNA and Surgeon  Anesthesia Plan Comments:         Anesthesia Quick Evaluation

## 2013-07-11 NOTE — Op Note (Signed)
Bridget Conway, Bridget Conway                 ACCOUNT NO.:  [id number]  MEDICAL RECORD NO.:  [id number]  LOCATION:                                 FACILITY:  PHYSICIAN:  Leanora Cover, MD             DATE OF BIRTH:  DATE OF PROCEDURE:  07/11/2013 DATE OF DISCHARGE:                              OPERATIVE REPORT   PREOPERATIVE DIAGNOSIS:  Left carpal tunnel syndrome.  POSTOPERATIVE DIAGNOSIS:  Left carpal tunnel syndrome.  PROCEDURE:  Left carpal tunnel release.  SURGEON:  Leanora Cover, MD.  ASSISTANT:  None.  ANESTHESIA:  General.  IV FLUIDS:  Per anesthesia flow sheet.  ESTIMATED BLOOD LOSS:  Minimal.  COMPLICATIONS:  None.  SPECIMENS:  None.  TOURNIQUET TIME:  14 minutes.  DISPOSITION:  Stable to PACU.  INDICATIONS:  Bridget Conway is a 58 year old female who has had pain, pins and needle sensation in the left hand.  This wakes her at night.  She wished to have a carpal tunnel release for management of symptoms.  She has positive nerve conduction studies.  Risks, benefits, alternatives of surgery were discussed including risk of blood loss, infection; damage to nerves, vessels, tendons, ligaments, bone; failure of surgery; need for additional surgery, complications with wound healing, continued pain, and recurrent carpal tunnel syndrome.  She voiced understanding of these risks and elected to proceed.  OPERATIVE COURSE:  After being identified preoperatively by myself, the patient and I agreed upon procedure and site of procedure.  Surgical site was marked.  The risks, benefits, and alternatives of surgery were reviewed and she wished to proceed.  Surgical consent had been signed. She was given IV Ancef as preoperative antibiotic prophylaxis.  She was transferred to the operating room and placed on the operating room table in supine position with the left upper extremity on arm board.  General anesthesia was induced by Anesthesiology.  Left upper extremity was prepped and draped  in normal sterile orthopedic fashion.  Surgical pause performed between surgeons, anesthesia, operating staff, and all were in agreement as to the patient, procedure, and site of procedure. Tourniquet at the proximal aspect of the extremity was inflated to 250 mmHg after exsanguination of the limb with an Esmarch bandage.  Incision was made over the transverse carpal ligament and carried into subcutaneous tissues by spreading technique.  Bipolar electrocautery was used to obtain hemostasis.  The palmar fascia was incised.  Transverse carpal ligament was identified.  It was incised sharply distally first. Care was taken to ensure complete decompression distally.  It was then incised proximally.  Scissors were used to split the distal aspect of the volar antebrachial fascia.  Finger was placed into the wound to ensure complete decompression which was the case.  The nerve was inspected.  It was flattened and adherent to the radial leaflet.  It was freed up.  The motor branch was identified and was intact.  The wound was copiously irrigated with sterile saline.  It was then closed with 4- 0 nylon in a horizontal mattress fashion.  The wound was injected with 10 mL of 0.25% plain Marcaine to aid in postoperative analgesia.  It was then dressed with sterile Xeroform, 4x4s, and ABD and wrapped with Kerlix and Ace bandage.  Tourniquet was deflated at 14 minutes. Fingertips were pink with brisk capillary refill after deflation of tourniquet.  Operative drapes were broken down.  The patient was awoken from anesthesia safely.  She was transferred back to stretcher and taken to PACU in stable condition.  I will see her back in the office in 1 week for postoperative followup.  I will give her Percocet 5/325, 1-2 p.o. q.6 hours p.r.n. pain, dispensed #30.     Leanora Cover, MD     KK/MEDQ  D:  07/11/2013  T:  07/11/2013  Job:  [id number]

## 2013-07-11 NOTE — Anesthesia Procedure Notes (Signed)
Procedure Name: LMA Insertion Date/Time: 07/11/2013 9:00 AM Performed by: Melynda Ripple D Pre-anesthesia Checklist: Patient identified, Emergency Drugs available, Suction available and Patient being monitored Patient Re-evaluated:Patient Re-evaluated prior to inductionOxygen Delivery Method: Circle System Utilized Preoxygenation: Pre-oxygenation with 100% oxygen Intubation Type: IV induction Ventilation: Mask ventilation without difficulty LMA: LMA inserted LMA Size: 4.0 Number of attempts: 1 Airway Equipment and Method: bite block Placement Confirmation: positive ETCO2 Tube secured with: Tape Dental Injury: Teeth and Oropharynx as per pre-operative assessment

## 2013-07-11 NOTE — Discharge Instructions (Addendum)

## 2013-07-11 NOTE — Anesthesia Postprocedure Evaluation (Signed)
Anesthesia Post Note  Patient: Bridget Conway  Procedure(s) Performed: Procedure(s) (LRB): LEFT CARPAL TUNNEL RELEASE (Left)  Anesthesia type: General  Patient location: PACU  Post pain: Pain level controlled  Post assessment: Patient's Cardiovascular Status Stable  Last Vitals:  Filed Vitals:   07/11/13 0930  BP: 117/74  Pulse: 86  Temp: 36.5 C  Resp: 12    Post vital signs: Reviewed and stable  Level of consciousness: alert  Complications: No apparent anesthesia complications

## 2013-07-16 ENCOUNTER — Encounter (HOSPITAL_BASED_OUTPATIENT_CLINIC_OR_DEPARTMENT_OTHER): Payer: Self-pay | Admitting: Orthopedic Surgery

## 2013-12-19 ENCOUNTER — Encounter: Payer: Self-pay | Admitting: Nurse Practitioner

## 2013-12-19 ENCOUNTER — Ambulatory Visit (INDEPENDENT_AMBULATORY_CARE_PROVIDER_SITE_OTHER): Payer: BC Managed Care – PPO | Admitting: Nurse Practitioner

## 2013-12-19 VITALS — BP 141/73 | HR 99 | Temp 98.5°F | Ht 65.0 in | Wt 289.4 lb

## 2013-12-19 DIAGNOSIS — I159 Secondary hypertension, unspecified: Secondary | ICD-10-CM

## 2013-12-19 DIAGNOSIS — I158 Other secondary hypertension: Secondary | ICD-10-CM

## 2013-12-19 DIAGNOSIS — R0989 Other specified symptoms and signs involving the circulatory and respiratory systems: Secondary | ICD-10-CM

## 2013-12-19 DIAGNOSIS — F3289 Other specified depressive episodes: Secondary | ICD-10-CM

## 2013-12-19 DIAGNOSIS — F329 Major depressive disorder, single episode, unspecified: Secondary | ICD-10-CM

## 2013-12-19 DIAGNOSIS — R0609 Other forms of dyspnea: Secondary | ICD-10-CM

## 2013-12-19 DIAGNOSIS — R739 Hyperglycemia, unspecified: Secondary | ICD-10-CM

## 2013-12-19 DIAGNOSIS — F32A Depression, unspecified: Secondary | ICD-10-CM | POA: Insufficient documentation

## 2013-12-19 DIAGNOSIS — I10 Essential (primary) hypertension: Secondary | ICD-10-CM

## 2013-12-19 DIAGNOSIS — K219 Gastro-esophageal reflux disease without esophagitis: Secondary | ICD-10-CM

## 2013-12-19 DIAGNOSIS — E876 Hypokalemia: Secondary | ICD-10-CM | POA: Insufficient documentation

## 2013-12-19 DIAGNOSIS — I119 Hypertensive heart disease without heart failure: Secondary | ICD-10-CM | POA: Insufficient documentation

## 2013-12-19 DIAGNOSIS — F339 Major depressive disorder, recurrent, unspecified: Secondary | ICD-10-CM | POA: Insufficient documentation

## 2013-12-19 DIAGNOSIS — E119 Type 2 diabetes mellitus without complications: Secondary | ICD-10-CM | POA: Insufficient documentation

## 2013-12-19 DIAGNOSIS — R7309 Other abnormal glucose: Secondary | ICD-10-CM

## 2013-12-19 LAB — POCT GLYCOSYLATED HEMOGLOBIN (HGB A1C): Hemoglobin A1C: 5.5

## 2013-12-19 LAB — POCT UA - MICROALBUMIN: Microalbumin Ur, POC: NEGATIVE mg/L

## 2013-12-19 MED ORDER — METFORMIN HCL 500 MG PO TABS: 500.0000 mg | ORAL_TABLET | Freq: Every evening | ORAL | Status: DC

## 2013-12-19 MED ORDER — SERTRALINE HCL 50 MG PO TABS: 50.0000 mg | ORAL_TABLET | Freq: Every day | ORAL | Status: DC

## 2013-12-19 MED ORDER — VALSARTAN-HYDROCHLOROTHIAZIDE 160-12.5 MG PO TABS: 1.0000 | ORAL_TABLET | Freq: Every day | ORAL | Status: DC

## 2013-12-19 NOTE — Progress Notes (Signed)
Subjective:    Patient ID: Bridget Conway, female    DOB: 1956/04/06, 58 y.o.   MRN: [id number]   Patient in today stating that she has been having SOB on exertion she was on mobic for knee pain and she felt that it was making her blood pressure go up so she went to see NP at work and they told her to stop her mobic and coe here to be seen.    Hypertension This is a chronic problem. The current episode started more than 1 year ago. The problem is unchanged. The problem is uncontrolled. Associated symptoms include shortness of breath. Pertinent negatives include no chest pain, headaches, neck pain, palpitations, peripheral edema or sweats. There are no associated agents to hypertension. Risk factors for coronary artery disease include dyslipidemia, family history, obesity, post-menopausal state and diabetes mellitus. Past treatments include diuretics. The current treatment provides mild improvement. Compliance problems include diet and exercise.   Hyperlipidemia This is a chronic problem. The current episode started more than 1 year ago. The problem is uncontrolled. Recent lipid tests were reviewed and are high. Exacerbating diseases include diabetes and obesity. She has no history of hypothyroidism. Associated symptoms include shortness of breath. Pertinent negatives include no chest pain. Current antihyperlipidemic treatment includes statins (statin cause muscle aches). The current treatment provides mild improvement of lipids. There are no compliance problems.  Risk factors for coronary artery disease include diabetes mellitus, dyslipidemia, hypertension, obesity and post-menopausal.  Diabetes She presents for her follow-up diabetic visit. She has type 2 diabetes mellitus. No MedicAlert identification noted. The initial diagnosis of diabetes was made 1 year ago. Her disease course has been stable (Patient did nit know she was a diabetoc was told she had impaired fasting glucose.). Pertinent negatives  for hypoglycemia include no headaches or sweats. Pertinent negatives for diabetes include no chest pain, no polydipsia, no polyphagia, no polyuria, no visual change and no weight loss. Symptoms are stable. Risk factors for coronary artery disease include diabetes mellitus, dyslipidemia, family history, hypertension, obesity and post-menopausal. Current diabetic treatment includes oral agent (monotherapy). She is compliant with treatment some of the time. Her weight is stable. When asked about meal planning, she reported none. She has not had a previous visit with a dietician. She rarely participates in exercise. (Patient has never been told to check blood sugars at home.) An ACE inhibitor/angiotensin II receptor blocker is not being taken. She does not see a podiatrist.Eye exam is current.  GERD Ranitidine is working well- keeps symptoms under control Hypokalemia Takes K+ supplements daily- no c/o lower ext cramping Depression zoloft working well- keeps her from being upset all the time.   Review of Systems  Constitutional: Negative for weight loss.  Respiratory: Positive for shortness of breath.   Cardiovascular: Negative for chest pain and palpitations.  Endocrine: Negative for polydipsia, polyphagia and polyuria.  Musculoskeletal: Negative for neck pain.  Neurological: Negative for headaches.       Objective:   Physical Exam  Constitutional: She is oriented to person, place, and time. She appears well-developed and well-nourished.  HENT:  Nose: Nose normal.  Mouth/Throat: Oropharynx is clear and moist.  Eyes: EOM are normal.  Neck: Trachea normal, normal range of motion and full passive range of motion without pain. Neck supple. No JVD present. Carotid bruit is not present. No thyromegaly present.  Cardiovascular: Normal rate, regular rhythm, normal heart sounds and intact distal pulses.  Exam reveals no gallop and no friction rub.  No murmur heard. Pulmonary/Chest: Effort normal and  breath sounds normal.  Abdominal: Soft. Bowel sounds are normal. She exhibits no distension and no mass. There is no tenderness.  Musculoskeletal: Normal range of motion.  Lymphadenopathy:    She has no cervical adenopathy.  Neurological: She is alert and oriented to person, place, and time. She has normal reflexes.  Skin: Skin is warm and dry.  Psychiatric: She has a normal mood and affect. Her behavior is normal. Judgment and thought content normal.    EKG- NSR-Kalleigh-Margaret Hassell Done, FNP  BP 141/73  Pulse 99  Temp(Src) 98.5 F (36.9 C) (Oral)  Ht _0  (1.651 m)  Wt 289 lb 6.4 oz (131.271 kg)  BMI 48.16 kg/m2  Results for orders placed in visit on 12/19/13  POCT GLYCOSYLATED HEMOGLOBIN (HGB A1C)      Result Value Ref Range   Hemoglobin A1C 5.5          Assessment & Plan:   1. High blood sugar   2. Secondary hypertension, unspecified   3. Essential hypertension, benign   4. Type II or unspecified type diabetes mellitus without mention of complication, not stated as uncontrolled   5. Hypokalemia   6. Gastroesophageal reflux disease without esophagitis   7. Depression   8. DOE (dyspnea on exertion)    Orders Placed This Encounter  Procedures  . CMP14+EGFR  . NMR, lipoprofile  . Ambulatory referral to Cardiology    Referral Priority:  Routine    Referral Type:  Consultation    Referral Reason:  Specialty Services Required    Requested Specialty:  Cardiology    Number of Visits Requested:  1  . POCT glycosylated hemoglobin (Hb A1C)  . POCT UA - Microalbumin  . EKG 12-Lead   Meds ordered this encounter  Medications  . valsartan-hydrochlorothiazide (DIOVAN HCT) 160-12.5 MG per tablet    Sig: Take 1 tablet by mouth daily.    Dispense:  90 tablet    Refill:  1    Order Specific Question:  Supervising Provider    Answer:  Chipper Herb [[id number]]  . sertraline (ZOLOFT) 50 MG tablet    Sig: Take 1 tablet (50 mg total) by mouth daily.    Dispense:  90 tablet     Refill:  1    Order Specific Question:  Supervising Provider    Answer:  Chipper Herb [[id number]]  . metFORMIN (GLUCOPHAGE) 500 MG tablet    Sig: Take 1 tablet (500 mg total) by mouth every evening.    Dispense:  180 tablet    Refill:  1    Order Specific Question:  Supervising Provider    Answer:  Joycelyn Man   Stop HCTZ- Change to diovan/hctz 160/12.5 daily- keep diary of blood pressure AVOID ALL NSAIDS Cardiology referral for stress test Discussed weight management for patient with BMI> 25 Labs pending Health maintenance reviewed Diet and exercise encouraged Continue all meds Follow up  In 3 month   [place], FNP

## 2013-12-19 NOTE — Patient Instructions (Signed)

## 2013-12-20 LAB — CMP14+EGFR
ALT: 16 IU/L (ref 0–32)
AST: 18 IU/L (ref 0–40)
Albumin/Globulin Ratio: 1.2 (ref 1.1–2.5)
Albumin: 4.1 g/dL (ref 3.6–4.8)
Alkaline Phosphatase: 68 IU/L (ref 39–117)
BUN/Creatinine Ratio: 24 (ref 11–26)
BUN: 17 mg/dL (ref 8–27)
CALCIUM: 9.4 mg/dL (ref 8.7–10.3)
CHLORIDE: 94 mmol/L — AB (ref 97–108)
CO2: 28 mmol/L (ref 18–29)
Creatinine, Ser: 0.71 mg/dL (ref 0.57–1.00)
GFR calc Af Amer: 107 mL/min/{1.73_m2} (ref >59)
GFR calc non Af Amer: 93 mL/min/{1.73_m2} (ref >59)
GLUCOSE: 125 mg/dL — AB (ref 65–99)
Globulin, Total: 3.3 g/dL (ref 1.5–4.5)
POTASSIUM: 4.1 mmol/L (ref 3.5–5.2)
Sodium: 139 mmol/L (ref 134–144)
TOTAL PROTEIN: 7.4 g/dL (ref 6.0–8.5)
Total Bilirubin: 0.3 mg/dL (ref 0.0–1.2)

## 2013-12-20 LAB — NMR, LIPOPROFILE
CHOLESTEROL: 220 mg/dL — AB (ref 100–199)
HDL CHOLESTEROL BY NMR: 47 mg/dL (ref >39)
HDL Particle Number: 37.8 umol/L (ref >=30.5)
LDL PARTICLE NUMBER: 1877 nmol/L — AB (ref <1000)
LDL Size: 21.1 nm (ref >20.5)
LDLC SERPL CALC-MCNC: 127 mg/dL — AB (ref 0–99)
LP-IR Score: 71 — ABNORMAL HIGH (ref <=45)
Small LDL Particle Number: 1033 nmol/L — ABNORMAL HIGH (ref <=527)
Triglycerides by NMR: 231 mg/dL — ABNORMAL HIGH (ref 0–149)

## 2013-12-23 ENCOUNTER — Ambulatory Visit: Payer: Self-pay | Admitting: Family

## 2014-01-01 ENCOUNTER — Ambulatory Visit: Payer: BC Managed Care – PPO | Admitting: Cardiology

## 2014-01-17 ENCOUNTER — Ambulatory Visit: Payer: Self-pay | Admitting: Family

## 2014-01-24 ENCOUNTER — Ambulatory Visit: Payer: BC Managed Care – PPO | Admitting: Cardiology

## 2014-02-25 ENCOUNTER — Encounter: Payer: Self-pay | Admitting: Nurse Practitioner

## 2014-06-06 DIAGNOSIS — E785 Hyperlipidemia, unspecified: Secondary | ICD-10-CM | POA: Insufficient documentation

## 2014-07-29 ENCOUNTER — Ambulatory Visit: Payer: Self-pay | Admitting: Physical Therapy

## 2014-09-01 ENCOUNTER — Other Ambulatory Visit: Payer: Self-pay | Admitting: Nurse Practitioner

## 2014-09-02 NOTE — Telephone Encounter (Signed)
no more refills without being seen  

## 2014-09-02 NOTE — Telephone Encounter (Signed)
Last seen 12/20/14 MMM   Last gluocse 12/20/14   Requesting 90 day supply

## 2014-10-06 ENCOUNTER — Other Ambulatory Visit: Payer: Self-pay

## 2014-11-26 ENCOUNTER — Other Ambulatory Visit: Payer: Self-pay | Admitting: Nurse Practitioner

## 2014-11-27 NOTE — Telephone Encounter (Signed)
no more refills without being seen  

## 2014-11-27 NOTE — Telephone Encounter (Signed)
Last seen 12/19/13 MMM Requesting 90 day supply

## 2015-10-16 ENCOUNTER — Telehealth: Payer: Self-pay | Admitting: Nurse Practitioner

## 2015-10-26 NOTE — Telephone Encounter (Signed)
Transferred care

## 2016-01-25 ENCOUNTER — Encounter (HOSPITAL_COMMUNITY): Payer: Self-pay

## 2016-02-11 ENCOUNTER — Other Ambulatory Visit: Payer: Self-pay | Admitting: Physician Assistant

## 2016-02-11 DIAGNOSIS — Z1231 Encounter for screening mammogram for malignant neoplasm of breast: Secondary | ICD-10-CM

## 2016-03-17 ENCOUNTER — Ambulatory Visit
Admission: RE | Admit: 2016-03-17 | Discharge: 2016-03-17 | Disposition: A | Discharge status: Home / Self Care | Payer: BLUE CROSS/BLUE SHIELD | Source: Ambulatory Visit | Attending: Physician Assistant | Admitting: Physician Assistant

## 2016-03-17 DIAGNOSIS — Z1231 Encounter for screening mammogram for malignant neoplasm of breast: Secondary | ICD-10-CM

## 2017-01-31 ENCOUNTER — Encounter (HOSPITAL_COMMUNITY): Payer: Self-pay

## 2017-04-12 ENCOUNTER — Other Ambulatory Visit: Payer: Self-pay | Admitting: Physician Assistant

## 2017-04-12 DIAGNOSIS — Z1231 Encounter for screening mammogram for malignant neoplasm of breast: Secondary | ICD-10-CM

## 2017-05-04 ENCOUNTER — Ambulatory Visit: Payer: BLUE CROSS/BLUE SHIELD

## 2017-06-23 ENCOUNTER — Other Ambulatory Visit: Payer: Self-pay | Admitting: Physician Assistant

## 2017-06-23 ENCOUNTER — Ambulatory Visit
Admission: RE | Admit: 2017-06-23 | Discharge: 2017-06-23 | Disposition: A | Discharge status: Home / Self Care | Payer: No Typology Code available for payment source | Source: Ambulatory Visit | Attending: Physician Assistant | Admitting: Physician Assistant

## 2017-06-23 DIAGNOSIS — Z1231 Encounter for screening mammogram for malignant neoplasm of breast: Secondary | ICD-10-CM

## 2018-02-25 DIAGNOSIS — R310 Gross hematuria: Secondary | ICD-10-CM

## 2018-02-25 HISTORY — DX: Gross hematuria: R31.0

## 2018-06-20 ENCOUNTER — Other Ambulatory Visit: Payer: Self-pay | Admitting: Physician Assistant

## 2018-06-20 DIAGNOSIS — Z1231 Encounter for screening mammogram for malignant neoplasm of breast: Secondary | ICD-10-CM

## 2018-07-16 ENCOUNTER — Ambulatory Visit: Payer: No Typology Code available for payment source

## 2018-07-27 DIAGNOSIS — M47812 Spondylosis without myelopathy or radiculopathy, cervical region: Secondary | ICD-10-CM | POA: Diagnosis not present

## 2018-07-27 DIAGNOSIS — M546 Pain in thoracic spine: Secondary | ICD-10-CM | POA: Diagnosis not present

## 2018-07-27 DIAGNOSIS — M47816 Spondylosis without myelopathy or radiculopathy, lumbar region: Secondary | ICD-10-CM | POA: Diagnosis not present

## 2018-08-13 ENCOUNTER — Encounter: Payer: Self-pay | Admitting: *Deleted

## 2018-08-13 ENCOUNTER — Ambulatory Visit: Payer: Self-pay | Admitting: *Deleted

## 2018-08-16 ENCOUNTER — Other Ambulatory Visit: Payer: Self-pay | Admitting: *Deleted

## 2018-08-16 NOTE — Patient Outreach (Signed)
[facility]) Care Management  08/16/2018  Bridget Conway 1955/07/09 [id number]   [place] mailed patient a [facility] Patient Welcome Letter, including a [facility] magnet and pamphlet.  CSW also mailed patient a Patient Programmer, multimedia, including an [place] documents, as well as "Advanced Directives" EMMI information, per the request of patient's health risk assessment screening.  CSW will outreach to patient again in 6 month, on Friday, February 15, 2019, around 12:00PM, to ensure that patient received the information, as well as answer any questions that patient may have and/or offer assistance with completion.  [name], BSW, MSW, LCSW  Licensed Education officer, [facility]  [address] [place] [address] [place], [place] [address] Physical Address-[address], [place], [place] [address] Toll Free Main # [phone] Fax # [phone] Cell # [phone]  Office # [phone] [email or web address]

## 2018-08-17 ENCOUNTER — Ambulatory Visit: Payer: Self-pay | Admitting: *Deleted

## 2018-09-08 DIAGNOSIS — I340 Nonrheumatic mitral (valve) insufficiency: Secondary | ICD-10-CM | POA: Insufficient documentation

## 2018-09-08 NOTE — Progress Notes (Signed)
Subjective:  Primary Physician:  Aura Dials, PA-C  Patient ID: Bridget Conway, female    DOB: 1955/05/23, 63 y.o.   MRN: [id number]  This visit type was conducted due to national recommendations for restrictions regarding the COVID-19 Pandemic (e.g. social distancing).  This format is felt to be most appropriate for this patient at this time.  All issues noted in this document were discussed and addressed.  No physical exam was performed (except for noted visual exam findings with Telehealth visits - very limited).  The patient has consented to conduct a Telehealth visit and understands insurance will be billed.   I connected with patient, on 09/10/18  by a video enabled telemedicine application and verified that I am speaking with the correct person using two identifiers.     I discussed the limitations of evaluation and management by telemedicine and the availability of in person appointments. The patient expressed understanding and agreed to proceed.   I have discussed with patient regarding the safety during COVID Pandemic and steps and precautions including social distancing with the patient.    Chief Complaint  Patient presents with  . Mitral Regurgitation  . Follow-up    HPI: Bridget Conway  is a 63 y.o. female, who presents for a Follow-up for Mitral regurgitation.-Mild to moderate by echo. in Nov. 2019.  Patient has been feeling very well.  She denies any complaints of chest pain, tightness or pressure.  No shortness of breath, orthopnea or PND.  No complaints of palpitation.There is no dizziness, near-syncope or syncope. She has mild chronic swelling on the legs which has improved with furosemide therapy. No complaints of leg claudication.  Patient has h/o hypertension, she was on medicines in the past but patient says that she went on strict diet, lost significant weight and blood pressure has been controlled. She is only on furosemide presently. She has been monitoring  blood pressure at home and it has remained normal. She also has prediabetes. Patient has history of mild hypercholesterolemia, she was given Crestor in the past but it was stopped because of myalgias. She had smoked one pack per day for 20 years but quit smoking completely in 2002. She has morbid obesity. Patient has been following the diet and has lost 11 pounds in past 6 months.  Patient used to exercise in the gym 3 times a week but has not been able to do for past 3 months due to Covid virus situation. She has been staying active, working in the yard and garden etc. regularly.  No history of rheumatic fever. No history of thyroid problems. No history of TIA or CVA. She has history of GERD,and h/o depression.  Past Medical History:  Diagnosis Date  . Allergy   . Anxiety   . Cancer ([place])    basal cell CA- below eye  . Depression   . GERD (gastroesophageal reflux disease)   . Hypertension   . IFG (impaired fasting glucose) 2014  . Osteoporosis   . Wears glasses   . Wears partial dentures    upper partial    Past Surgical History:  Procedure Laterality Date  . CARPAL TUNNEL RELEASE Left 07/11/2013   Procedure: LEFT CARPAL TUNNEL RELEASE;  Surgeon: Tennis Must, MD;  Location: [facility];  Service: Orthopedics;  Laterality: Left;  . carpel tunnel  2005   right wrist  . COLONOSCOPY    . hysterectomy  2002  . KNEE SURGERY Left   .  LAPAROSCOPIC GASTRIC BANDING  2010    Social History   Socioeconomic History  . Marital status: Divorced    Spouse name: Not on file  . Number of children: 1  . Years of education: Not on file  . Highest education level: Not on file  Occupational History  . Not on file  Social Needs  . Financial resource strain: Not on file  . Food insecurity:    Worry: Not on file    Inability: Not on file  . Transportation needs:    Medical: Not on file    Non-medical: Not on file  Tobacco Use  . Smoking status: Former Smoker    Last  attempt to quit: 07/09/2000    Years since quitting: 18.1  . Smokeless tobacco: Never Used  Substance and Sexual Activity  . Alcohol use: No  . Drug use: No  . Sexual activity: Not on file  Lifestyle  . Physical activity:    Days per week: Not on file    Minutes per session: Not on file  . Stress: Not on file  Relationships  . Social connections:    Talks on phone: Not on file    Gets together: Not on file    Attends religious service: Not on file    Active member of club or organization: Not on file    Attends meetings of clubs or organizations: Not on file    Relationship status: Not on file  . Intimate partner violence:    Fear of current or ex partner: Not on file    Emotionally abused: Not on file    Physically abused: Not on file    Forced sexual activity: Not on file  Other Topics Concern  . Not on file  Social History Narrative  . Not on file    Current Outpatient Medications on File Prior to Visit  Medication Sig Dispense Refill  . fish oil-omega-3 fatty acids 1000 MG capsule Take 1,200 mg by mouth daily.    . Flaxseed, Linseed, (FLAXSEED OIL PO) Take 2,400 mg by mouth daily.    . fluticasone (FLONASE) 50 MCG/ACT nasal spray     . furosemide (LASIX) 20 MG tablet Take 20 mg by mouth 2 (two) times a day.    Marland Kitchen MELATONIN ER PO Take by mouth daily.    . Multiple Vitamins-Minerals (MULTIVITAMIN PO) Take by mouth daily.    Marland Kitchen POTASSIUM PO Take by mouth daily.    . Probiotic Product (PROBIOTIC DAILY PO) Take by mouth daily.    . sertraline (ZOLOFT) 50 MG tablet TAKE ONE (1) TABLET EACH DAY 90 tablet 0   No current facility-administered medications on file prior to visit.     Review of Systems  Constitutional: Negative for fever.  HENT: Negative for nosebleeds.   Eyes: Negative for blurred vision.  Respiratory: Negative for cough and shortness of breath.   Cardiovascular: Positive for leg swelling (minimal). Negative for palpitations.  Gastrointestinal: Negative for  abdominal pain, nausea and vomiting.  Genitourinary: Negative for dysuria.  Musculoskeletal: Negative for myalgias.  Skin: Negative for itching and rash.  Neurological: Negative for dizziness, seizures and loss of consciousness.  Psychiatric/Behavioral: The patient is not nervous/anxious.        Objective:  Blood pressure 123/81, height 5\' 5"  (1.651 m), weight 253 lb (114.8 kg). Body mass index is 42.1 kg/m.  Physical Exam  Patient is alert and oriented, obese, appeared very comfortable talking to me during the visit. No further detailed  physical examination was possible as it was a telemedicine visit.  CARDIAC STUDIES:  Echo- 02/21/2018 1. Left ventricle cavity is normal in size. Mild concentric hypertrophy of the left ventricle. Normal global wall motion. Calculated EF 65%. 2. Left atrial cavity is borderline dilated. 3. Structurally normal aortic valve with no regurgitation noted. 4. Peak aortic velocity is borderline increased, probably normal variant or hyperdynamic LV. 5. Mild to moderate mitral regurgitation. Mild calcification of the mitral valve annulus. 6. Mild tricuspid regurgitation. No evidence of pulmonary hypertension.  Exercise sestamibi stress test 03/05/2018: 1. The patient performed treadmill exercise using Bruce protocol, completing 4:36 minutes. The patient completed an estimated workload of 5.7 METS, reaching 96% of the maximum predicted heart rate. Hemodynamic response was normal. No stress symptoms reported. Exercise capacity was low. No ischemic changes seen on stress electrocardiogram. 2. The overall quality of the study is good. There is no evidence of abnormal lung activity. Stress and rest SPECT images demonstrate homogeneous tracer distribution throughout the myocardium. Gated SPECT imaging reveals normal myocardial thickening and wall motion. The left ventricular ejection fraction was normal (62%). 3. Low risk study.  Assessment & Recommendations:    1. Nonrheumatic mitral valve regurgitation  2. Hypertension with heart disease  3. Morbid obesity ([place])   Laboratory Exam: 10/30/2017-BUN-16, creatinine-0.6, sodium-141, potassium-4.1, normal liver enzymes.  CBC Latest Ref Rng & Units 07/11/2013 07/01/2008 06/30/2008  WBC 4.0 - 10.5 K/uL - - 5.2  Hemoglobin 12.0 - 15.0 g/dL 14.6 12.7 12.9  Hematocrit 36.0 - 46.0 % 43.0 38.2 37.7  Platelets 150 - 400 K/uL - - 235   CMP Latest Ref Rng & Units 12/19/2013 07/11/2013 06/30/2008  Glucose 65 - 99 mg/dL 125(H) 115(H) 124(H)  BUN 8 - 27 mg/dL 17 21 24(H)  Creatinine 0.57 - 1.00 mg/dL 0.71 0.70 0.50  Sodium 134 - 144 mmol/L 139 142 141  Potassium 3.5 - 5.2 mmol/L 4.1 3.3(L) 3.3(L)  Chloride 97 - 108 mmol/L 94(L) 99 104  CO2 18 - 29 mmol/L 28 - 30  Calcium 8.7 - 10.3 mg/dL 9.4 - 9.1  Total Protein 6.0 - 8.5 g/dL 7.4 - 6.9  Total Bilirubin 0.0 - 1.2 mg/dL 0.3 - 0.6  Alkaline Phos 39 - 117 IU/L 68 - 70  AST 0 - 40 IU/L 18 - 24  ALT 0 - 32 IU/L 16 - 33   Lipid Panel     Component Value Date/Time   CHOL 220 (H) 12/19/2013 1318   TRIG 231 (H) 12/19/2013 1318   HDL 47 12/19/2013 1318   LDLCALC 127 (H) 12/19/2013 1318     Recommendation:  Patient is doing well.  She has improved symptomatically and blood pressure is well controlled without any therapy.  I have advised her to continue present medications.  Primary prevention was again discussed with her. Patient was advised to follow strict ADA, low-salt, low-cholesterol diet. Patient has followed the diet well and has lost 11 pounds in past 6 months.She was encouraged to continue to restrict calories to lose more weight. She was encouraged to start regular walking and start exercises again when feasible.  I will see her in follow-up after 1 year. Patient was advised to call us if there are any cardiac problems in the interim. She will have echocardiogram before the next visit for follow-up of mitral regurgitation.  She will continue to have  blood tests at her PCP's office.   Despina Hick, MD, [facility] 09/10/2018, 11:24 AM [facility]. [place] Pager: [phone] Office:  [phone] If no answer Cell [phone]

## 2018-09-10 ENCOUNTER — Other Ambulatory Visit: Payer: Self-pay

## 2018-09-10 ENCOUNTER — Encounter: Payer: Self-pay | Admitting: Cardiology

## 2018-09-10 ENCOUNTER — Ambulatory Visit: Payer: PPO | Admitting: Cardiology

## 2018-09-10 DIAGNOSIS — I340 Nonrheumatic mitral (valve) insufficiency: Secondary | ICD-10-CM | POA: Diagnosis not present

## 2018-09-10 DIAGNOSIS — I119 Hypertensive heart disease without heart failure: Secondary | ICD-10-CM | POA: Diagnosis not present

## 2018-09-20 ENCOUNTER — Ambulatory Visit: Payer: No Typology Code available for payment source

## 2018-09-20 ENCOUNTER — Other Ambulatory Visit: Payer: Self-pay

## 2018-09-20 ENCOUNTER — Ambulatory Visit
Admission: RE | Admit: 2018-09-20 | Discharge: 2018-09-20 | Disposition: A | Discharge status: Home / Self Care | Payer: PPO | Source: Ambulatory Visit | Attending: Physician Assistant | Admitting: Physician Assistant

## 2018-09-20 DIAGNOSIS — Z1231 Encounter for screening mammogram for malignant neoplasm of breast: Secondary | ICD-10-CM

## 2018-09-20 LAB — HM MAMMOGRAPHY

## 2018-09-27 ENCOUNTER — Other Ambulatory Visit: Payer: Self-pay | Admitting: *Deleted

## 2018-09-27 NOTE — Patient Outreach (Signed)
[facility]) Care Management  09/27/2018  Bridget Conway July 03, 1955 [id number]   Information provided to patient:  "I want to inform you that [employer] has partnered with [place] ([facility]).  A new care manager or health coach will be in touch with you in the next few weeks to continue working with you and your providers in achieving your health goals."  Your new care manager is aware of your request to complete your Advanced Directives (Living Will and Hurt documents), which have already been mailed to your home for your convenience.    "Your new care manager will be [name] (RN)/[name] [facility]), with whom I will be discussing your health conditions, current care plan, and the progress you have made so far in this program.   You will be receiving a welcome letter in the mail with further details of the [facility]'s complex care and condition management programs. For more information on the [facility], please visit [email or web address] or call toll free number [phone]."  [name], BSW, MSW, [place]  Licensed Clinical Social Worker  [name]  Mailing [place]. [address], [place], [place] [address] Physical Address-[address], [place], [place] [address] Toll Free Main # [phone] Fax # [phone] Cell # [phone]  Office # [phone] [email or web address]

## 2019-01-07 DIAGNOSIS — Z85828 Personal history of other malignant neoplasm of skin: Secondary | ICD-10-CM | POA: Diagnosis not present

## 2019-01-07 DIAGNOSIS — L509 Urticaria, unspecified: Secondary | ICD-10-CM | POA: Diagnosis not present

## 2019-01-07 DIAGNOSIS — L570 Actinic keratosis: Secondary | ICD-10-CM | POA: Diagnosis not present

## 2019-02-01 DIAGNOSIS — M47816 Spondylosis without myelopathy or radiculopathy, lumbar region: Secondary | ICD-10-CM | POA: Diagnosis not present

## 2019-02-01 DIAGNOSIS — M546 Pain in thoracic spine: Secondary | ICD-10-CM | POA: Diagnosis not present

## 2019-02-01 DIAGNOSIS — M47812 Spondylosis without myelopathy or radiculopathy, cervical region: Secondary | ICD-10-CM | POA: Diagnosis not present

## 2019-02-12 DIAGNOSIS — I10 Essential (primary) hypertension: Secondary | ICD-10-CM | POA: Diagnosis not present

## 2019-02-12 DIAGNOSIS — Z0000 Encounter for general adult medical examination without abnormal findings: Secondary | ICD-10-CM | POA: Diagnosis not present

## 2019-02-12 DIAGNOSIS — E2839 Other primary ovarian failure: Secondary | ICD-10-CM | POA: Diagnosis not present

## 2019-02-12 DIAGNOSIS — F3342 Major depressive disorder, recurrent, in full remission: Secondary | ICD-10-CM | POA: Diagnosis not present

## 2019-02-12 DIAGNOSIS — E785 Hyperlipidemia, unspecified: Secondary | ICD-10-CM | POA: Diagnosis not present

## 2019-02-12 DIAGNOSIS — Z6841 Body Mass Index (BMI) 40.0 and over, adult: Secondary | ICD-10-CM | POA: Diagnosis not present

## 2019-02-14 DIAGNOSIS — E785 Hyperlipidemia, unspecified: Secondary | ICD-10-CM | POA: Diagnosis not present

## 2019-02-14 DIAGNOSIS — I10 Essential (primary) hypertension: Secondary | ICD-10-CM | POA: Diagnosis not present

## 2019-02-15 ENCOUNTER — Ambulatory Visit: Payer: Self-pay | Admitting: *Deleted

## 2019-02-15 DIAGNOSIS — M47812 Spondylosis without myelopathy or radiculopathy, cervical region: Secondary | ICD-10-CM | POA: Diagnosis not present

## 2019-02-15 DIAGNOSIS — M47816 Spondylosis without myelopathy or radiculopathy, lumbar region: Secondary | ICD-10-CM | POA: Diagnosis not present

## 2019-02-15 DIAGNOSIS — M546 Pain in thoracic spine: Secondary | ICD-10-CM | POA: Diagnosis not present

## 2019-02-18 ENCOUNTER — Other Ambulatory Visit: Payer: Self-pay | Admitting: Physician Assistant

## 2019-02-18 DIAGNOSIS — E2839 Other primary ovarian failure: Secondary | ICD-10-CM

## 2019-02-26 DIAGNOSIS — M546 Pain in thoracic spine: Secondary | ICD-10-CM | POA: Diagnosis not present

## 2019-02-26 DIAGNOSIS — M47816 Spondylosis without myelopathy or radiculopathy, lumbar region: Secondary | ICD-10-CM | POA: Diagnosis not present

## 2019-02-26 DIAGNOSIS — M47812 Spondylosis without myelopathy or radiculopathy, cervical region: Secondary | ICD-10-CM | POA: Diagnosis not present

## 2019-03-05 DIAGNOSIS — M47816 Spondylosis without myelopathy or radiculopathy, lumbar region: Secondary | ICD-10-CM | POA: Diagnosis not present

## 2019-03-05 DIAGNOSIS — M546 Pain in thoracic spine: Secondary | ICD-10-CM | POA: Diagnosis not present

## 2019-03-05 DIAGNOSIS — M47812 Spondylosis without myelopathy or radiculopathy, cervical region: Secondary | ICD-10-CM | POA: Diagnosis not present

## 2019-03-12 DIAGNOSIS — M47816 Spondylosis without myelopathy or radiculopathy, lumbar region: Secondary | ICD-10-CM | POA: Diagnosis not present

## 2019-03-12 DIAGNOSIS — M47812 Spondylosis without myelopathy or radiculopathy, cervical region: Secondary | ICD-10-CM | POA: Diagnosis not present

## 2019-03-12 DIAGNOSIS — M546 Pain in thoracic spine: Secondary | ICD-10-CM | POA: Diagnosis not present

## 2019-03-26 DIAGNOSIS — M546 Pain in thoracic spine: Secondary | ICD-10-CM | POA: Diagnosis not present

## 2019-03-26 DIAGNOSIS — M47812 Spondylosis without myelopathy or radiculopathy, cervical region: Secondary | ICD-10-CM | POA: Diagnosis not present

## 2019-03-26 DIAGNOSIS — M47816 Spondylosis without myelopathy or radiculopathy, lumbar region: Secondary | ICD-10-CM | POA: Diagnosis not present

## 2019-04-26 ENCOUNTER — Ambulatory Visit: Payer: Medicare Other | Attending: Internal Medicine

## 2019-04-26 DIAGNOSIS — Z23 Encounter for immunization: Secondary | ICD-10-CM

## 2019-04-26 NOTE — Progress Notes (Signed)
   Covid-19 Vaccination Clinic  Name:  KATONA SECKER    MRN: [id number] DOB: 11-21-1955  04/26/2019  Ms. Gouge was observed post Covid-19 immunization for 15 minutes without incidence. She was provided with Vaccine Information Sheet and instruction to access the V-Safe system.   Ms. Ciprian was instructed to call 911 with any severe reactions post vaccine: Marland Kitchen Difficulty breathing  . Swelling of your face and throat  . A fast heartbeat  . A bad rash all over your body  . Dizziness and weakness    Immunizations Administered    Name Date Dose VIS Date Route   Pfizer COVID-19 Vaccine 04/26/2019 10:19 AM 0.3 mL 03/22/2019 Intramuscular   Manufacturer: [employer], [employer]   Lot: [id number]   [place]: [id number]

## 2019-05-10 ENCOUNTER — Ambulatory Visit
Admission: RE | Admit: 2019-05-10 | Discharge: 2019-05-10 | Disposition: A | Discharge status: Home / Self Care | Payer: Medicare Other | Source: Ambulatory Visit | Attending: Physician Assistant | Admitting: Physician Assistant

## 2019-05-10 ENCOUNTER — Other Ambulatory Visit: Payer: Self-pay

## 2019-05-10 DIAGNOSIS — E2839 Other primary ovarian failure: Secondary | ICD-10-CM

## 2019-05-17 ENCOUNTER — Ambulatory Visit: Payer: Medicare Other | Attending: Internal Medicine

## 2019-05-17 DIAGNOSIS — Z23 Encounter for immunization: Secondary | ICD-10-CM

## 2019-05-17 NOTE — Progress Notes (Signed)
° °  Covid-19 Vaccination Clinic  Name:  Bridget Conway    MRN: [id number] DOB: June 22, 1955  05/17/2019  [name] was observed post Covid-19 immunization for 15 minutes without incidence. She was provided with Vaccine Information Sheet and instruction to access the V-Safe system.   [name] was instructed to call 911 with any severe reactions post vaccine:  Difficulty breathing   Swelling of your face and throat   A fast heartbeat   A bad rash all over your body   Dizziness and weakness    Immunizations Administered    Name Date Dose VIS Date Route   Pfizer COVID-19 Vaccine 05/17/2019  9:43 AM 0.3 mL 03/22/2019 Intramuscular   Manufacturer: [name]   Lot: [id number]   [place]: [id number]

## 2019-09-03 LAB — HM DIABETES EYE EXAM

## 2019-09-10 ENCOUNTER — Ambulatory Visit: Payer: PPO | Admitting: Cardiology

## 2019-09-19 ENCOUNTER — Other Ambulatory Visit: Payer: Self-pay | Admitting: Physician Assistant

## 2019-09-19 DIAGNOSIS — Z1231 Encounter for screening mammogram for malignant neoplasm of breast: Secondary | ICD-10-CM

## 2019-09-30 ENCOUNTER — Ambulatory Visit
Admission: RE | Admit: 2019-09-30 | Discharge: 2019-09-30 | Disposition: A | Discharge status: Home / Self Care | Payer: Medicare Other | Source: Ambulatory Visit

## 2019-09-30 ENCOUNTER — Other Ambulatory Visit: Payer: Self-pay

## 2019-09-30 DIAGNOSIS — Z1231 Encounter for screening mammogram for malignant neoplasm of breast: Secondary | ICD-10-CM

## 2019-10-03 ENCOUNTER — Other Ambulatory Visit: Payer: Self-pay | Admitting: Physician Assistant

## 2019-10-03 DIAGNOSIS — R928 Other abnormal and inconclusive findings on diagnostic imaging of breast: Secondary | ICD-10-CM

## 2019-10-10 ENCOUNTER — Ambulatory Visit
Admission: RE | Admit: 2019-10-10 | Discharge: 2019-10-10 | Disposition: A | Discharge status: Home / Self Care | Payer: Medicare Other | Source: Ambulatory Visit | Attending: Physician Assistant | Admitting: Physician Assistant

## 2019-10-10 ENCOUNTER — Other Ambulatory Visit: Payer: Self-pay | Admitting: Physician Assistant

## 2019-10-10 ENCOUNTER — Other Ambulatory Visit: Payer: Self-pay

## 2019-10-10 DIAGNOSIS — R928 Other abnormal and inconclusive findings on diagnostic imaging of breast: Secondary | ICD-10-CM

## 2019-10-10 DIAGNOSIS — R921 Mammographic calcification found on diagnostic imaging of breast: Secondary | ICD-10-CM

## 2019-10-17 ENCOUNTER — Ambulatory Visit
Admission: RE | Admit: 2019-10-17 | Discharge: 2019-10-17 | Disposition: A | Discharge status: Home / Self Care | Payer: Medicare Other | Source: Ambulatory Visit | Attending: Physician Assistant | Admitting: Physician Assistant

## 2019-10-17 ENCOUNTER — Other Ambulatory Visit: Payer: Self-pay

## 2019-10-17 DIAGNOSIS — R921 Mammographic calcification found on diagnostic imaging of breast: Secondary | ICD-10-CM

## 2019-10-31 ENCOUNTER — Ambulatory Visit: Payer: Self-pay | Admitting: General Surgery

## 2019-10-31 DIAGNOSIS — N6091 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: Secondary | ICD-10-CM

## 2019-11-05 ENCOUNTER — Telehealth: Payer: Self-pay | Admitting: Hematology and Oncology

## 2019-11-05 NOTE — Telephone Encounter (Signed)
Received a new pt referral from Dr. Marlou Starks at [facility] for the high risk breast clinic. Bridget Conway has been cld and scheduled to see Dr. Lindi Adie on 8/2 at 1pm. Pt aware to arrive 15 minutes early.

## 2019-11-08 ENCOUNTER — Other Ambulatory Visit: Payer: Self-pay | Admitting: General Surgery

## 2019-11-08 DIAGNOSIS — N6091 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: Secondary | ICD-10-CM

## 2019-11-10 NOTE — Progress Notes (Signed)
[name] CONSULT NOTE  Patient Care Team: Selinda Orion as PCP - General (Physician Assistant)  CHIEF COMPLAINTS/PURPOSE OF CONSULTATION:  Newly diagnosed high risk for breast cancer  HISTORY OF PRESENTING ILLNESS:  Bridget Conway 64 y.o. female is here because of recent diagnosis of high risk for breast cancer. Screening mammogram on 09/30/19 showed right breast calcifications spanning 1.2cm. Biopsy on 10/17/19 showed focal atypical ductal hyperplasia and microscopic intraductal papilloma with calcifications. She presents to the clinic today for initial evaluation.   I reviewed her records extensively and collaborated the history with the patient.  SUMMARY OF ONCOLOGIC HISTORY: Oncology History   No history exists.    MEDICAL HISTORY:  Past Medical History:  Diagnosis Date  . Allergy   . Anxiety   . Cancer ([place])    basal cell CA- below eye  . Depression   . GERD (gastroesophageal reflux disease)   . Hypertension   . IFG (impaired fasting glucose) 2014  . Osteoporosis   . Wears glasses   . Wears partial dentures    upper partial    SURGICAL HISTORY: Past Surgical History:  Procedure Laterality Date  . CARPAL TUNNEL RELEASE Left 07/11/2013   Procedure: LEFT CARPAL TUNNEL RELEASE;  Surgeon: Tennis Must, MD;  Location: [facility];  Service: Orthopedics;  Laterality: Left;  . carpel tunnel  2005   right wrist  . COLONOSCOPY    . hysterectomy  2002  . KNEE SURGERY Left   . LAPAROSCOPIC GASTRIC BANDING  2010    SOCIAL HISTORY: Social History   Socioeconomic History  . Marital status: Divorced    Spouse name: Not on file  . Number of children: 1  . Years of education: Not on file  . Highest education level: Not on file  Occupational History  . Not on file  Tobacco Use  . Smoking status: Former Smoker    Quit date: 07/09/2000    Years since quitting: 19.3  . Smokeless tobacco: Never Used  Vaping Use  . Vaping Use:  Never used  Substance and Sexual Activity  . Alcohol use: No  . Drug use: No  . Sexual activity: Not on file  Other Topics Concern  . Not on file  Social History Narrative  . Not on file   Social Determinants of Health   Financial Resource Strain:   . Difficulty of Paying Living Expenses:   Food Insecurity:   . Worried About Charity fundraiser in the Last Year:   . Arboriculturist in the Last Year:   Transportation Needs:   . Film/video editor (Medical):   Marland Kitchen Lack of Transportation (Non-Medical):   Physical Activity:   . Days of Exercise per Week:   . Minutes of Exercise per Session:   Stress:   . Feeling of Stress :   Social Connections:   . Frequency of Communication with Friends and Family:   . Frequency of Social Gatherings with Friends and Family:   . Attends Religious Services:   . Active Member of Clubs or Organizations:   . Attends Archivist Meetings:   Marland Kitchen Marital Status:   Intimate Partner Violence:   . Fear of Current or Ex-Partner:   . Emotionally Abused:   Marland Kitchen Physically Abused:   . Sexually Abused:     FAMILY HISTORY: Family History  Problem Relation Age of Onset  . Breast cancer Mother 31  . Heart attack Father   .  Colon cancer Neg Hx   . Esophageal cancer Neg Hx   . Rectal cancer Neg Hx   . Stomach cancer Neg Hx     ALLERGIES:  is allergic to nsaids, statins, and sulfa antibiotics.  MEDICATIONS:  Current Outpatient Medications  Medication Sig Dispense Refill  . fish oil-omega-3 fatty acids 1000 MG capsule Take 1,200 mg by mouth daily.    . Flaxseed, Linseed, (FLAXSEED OIL PO) Take 2,400 mg by mouth daily.    . fluticasone (FLONASE) 50 MCG/ACT nasal spray     . furosemide (LASIX) 20 MG tablet Take 20 mg by mouth 2 (two) times a day.    Marland Kitchen MELATONIN ER PO Take by mouth daily.    . Multiple Vitamins-Minerals (MULTIVITAMIN PO) Take by mouth daily.    Marland Kitchen POTASSIUM PO Take by mouth daily.    . Probiotic Product (PROBIOTIC DAILY PO)  Take by mouth daily.    . sertraline (ZOLOFT) 50 MG tablet TAKE ONE (1) TABLET EACH DAY 90 tablet 0   No current facility-administered medications for this visit.    REVIEW OF SYSTEMS:   Constitutional: Denies fevers, chills or abnormal night sweats Eyes: Denies blurriness of vision, double vision or watery eyes Ears, nose, mouth, throat, and face: Denies mucositis or sore throat Respiratory: Denies cough, dyspnea or wheezes Cardiovascular: Denies palpitation, chest discomfort or lower extremity swelling Gastrointestinal:  Denies nausea, heartburn or change in bowel habits Skin: Denies abnormal skin rashes Lymphatics: Denies new lymphadenopathy or easy bruising Neurological:Denies numbness, tingling or new weaknesses Behavioral/Psych: Mood is stable, no new changes  Breast: Denies any palpable lumps or discharge All other systems were reviewed with the patient and are negative.  PHYSICAL EXAMINATION: ECOG PERFORMANCE STATUS: 1 - Symptomatic but completely ambulatory  Vitals:   11/11/19 1305  BP: (!) 132/95  Pulse: 79  Resp: 18  Temp: 98.5 F (36.9 C)  SpO2: 99%   Filed Weights   11/11/19 1305  Weight: 269 lb 4.8 oz (122.2 kg)    GENERAL:alert, no distress and comfortable SKIN: skin color, texture, turgor are normal, no rashes or significant lesions EYES: normal, conjunctiva are pink and non-injected, sclera clear OROPHARYNX:no exudate, no erythema and lips, buccal mucosa, and tongue normal  NECK: supple, thyroid normal size, non-tender, without nodularity LYMPH:  no palpable lymphadenopathy in the cervical, axillary or inguinal LUNGS: clear to auscultation and percussion with normal breathing effort HEART: regular rate & rhythm and no murmurs and no lower extremity edema ABDOMEN:abdomen soft, non-tender and normal bowel sounds Musculoskeletal:no cyanosis of digits and no clubbing  PSYCH: alert & oriented x 3 with fluent speech NEURO: no focal motor/sensory  deficits BREAST: No palpable nodules in breast. No palpable axillary or supraclavicular lymphadenopathy (exam performed in the presence of a chaperone)   LABORATORY DATA:  I have reviewed the data as listed Lab Results  Component Value Date   WBC 5.2 06/30/2008   HGB 14.6 07/11/2013   HCT 43.0 07/11/2013   MCV 91.9 06/30/2008   PLT 235 06/30/2008   Lab Results  Component Value Date   NA 139 12/19/2013   K 4.1 12/19/2013   CL 94 (L) 12/19/2013   CO2 28 12/19/2013    RADIOGRAPHIC STUDIES: I have personally reviewed the radiological reports and agreed with the findings in the report.  ASSESSMENT AND PLAN:  Atypical ductal hyperplasia of right breast Mammogram 10/10/2019: Indeterminate calcifications spanning 1.2 cm superior central right breast Right breast biopsy 10/17/2019: Focal atypical ductal hyperplasia, microscopic intraductal  papilloma with calcifications  Pathology review: I discussed the difference between atypical ductal hyperplasia, DCIS and invasive breast cancer. I explained to her that atypical ductal hyperplasia is characterized by a proliferation of uniform epithelial cells filling part, but not the entirety, of the involved duct. ADH is associated with an increased risk of both ipsilateral and contralateral breast cancer and thus provides evidence of underlying breast abnormalities that predispose to breast cancer.   Prognosis: Tyra Cusick calculator predicts a 22.5% lifetime risk of breast cancer.  10-year risk of breast cancer at 14%.  I discussed the risks and benefits of tamoxifen therapy.  After understanding the risks and benefits of tamoxifen, she decided that she does not want to take it because she gets very bad reactions to medications.  Additional recommendations 1. Exercise 30 minutes daily 2. Weight loss 3. Increasing fruits and vegetables and less red meat I believe all of the above would extensively decrease her risk of breast cancer as well as other  cancers including cancers of the colon substantially.  Surveillance plan: Annual mammograms and breast exams and breast MRI. We will order a breast MRI to be done in February 2022. She will call us before the breast MRI to pick up lorazepam prescription so that she can get through the MRI.  She has claustrophobia.   All questions were answered. The patient knows to call the clinic with any problems, questions or concerns.   Rulon Eisenmenger, MD, MPH 11/11/2019    I, Molly Dorshimer, am acting as scribe for Nicholas Lose, MD.  I have reviewed the above documentation for accuracy and completeness, and I agree with the above.

## 2019-11-11 ENCOUNTER — Other Ambulatory Visit: Payer: Self-pay

## 2019-11-11 ENCOUNTER — Inpatient Hospital Stay: Payer: Medicare Other | Attending: Hematology and Oncology | Admitting: Hematology and Oncology

## 2019-11-11 ENCOUNTER — Telehealth: Payer: Self-pay | Admitting: Hematology and Oncology

## 2019-11-11 DIAGNOSIS — N921 Excessive and frequent menstruation with irregular cycle: Secondary | ICD-10-CM

## 2019-11-11 DIAGNOSIS — N6091 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: Secondary | ICD-10-CM

## 2019-11-11 DIAGNOSIS — R921 Mammographic calcification found on diagnostic imaging of breast: Secondary | ICD-10-CM | POA: Diagnosis not present

## 2019-11-11 HISTORY — DX: 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: N60.91

## 2019-11-11 NOTE — Assessment & Plan Note (Addendum)
Mammogram 10/10/2019: Indeterminate calcifications spanning 1.2 cm superior central right breast Right breast biopsy 10/17/2019: Focal atypical ductal hyperplasia, microscopic intraductal papilloma with calcifications  Pathology review: I discussed the difference between atypical ductal hyperplasia, DCIS and invasive breast cancer. I explained to her that atypical ductal hyperplasia is characterized by a proliferation of uniform epithelial cells filling part, but not the entirety, of the involved duct. ADH is associated with an increased risk of both ipsilateral and contralateral breast cancer and thus provides evidence of underlying breast abnormalities that predispose to breast cancer.   Prognosis:Using the American Cancer Society breast cancer risk assessment tool, her risk of breast cancer in 5 years is at 1.1% ( average woman's risk is 0.6%), her lifetime risk of breast cancers at 15.4% ( average risk is a 10%)  I discussed the risks and benefits of tamoxifen therapy.    Additional recommendations 1. Exercise 30 minutes daily 2. Weight loss 3. Increasing fruits and vegetables and less red meat I believe all of the above would extensively decrease her risk of breast cancer as well as other cancers including cancers of the colon substantially.  Surveillance plan: Annual mammograms and breast exams

## 2019-11-11 NOTE — Telephone Encounter (Signed)
Scheduled appts per 8/2 los. Pt declined print out of AVS and state she would refer to mychart.

## 2019-12-23 ENCOUNTER — Other Ambulatory Visit (HOSPITAL_COMMUNITY): Payer: Medicare Other

## 2019-12-24 ENCOUNTER — Encounter (HOSPITAL_BASED_OUTPATIENT_CLINIC_OR_DEPARTMENT_OTHER): Payer: Self-pay | Admitting: General Surgery

## 2019-12-24 ENCOUNTER — Other Ambulatory Visit (HOSPITAL_COMMUNITY)
Admission: RE | Admit: 2019-12-24 | Discharge: 2019-12-24 | Disposition: A | Discharge status: Home / Self Care | Payer: Medicare Other | Source: Ambulatory Visit | Attending: General Surgery | Admitting: General Surgery

## 2019-12-24 ENCOUNTER — Other Ambulatory Visit: Payer: Self-pay

## 2019-12-24 ENCOUNTER — Encounter (HOSPITAL_BASED_OUTPATIENT_CLINIC_OR_DEPARTMENT_OTHER)
Admission: RE | Admit: 2019-12-24 | Discharge: 2019-12-24 | Disposition: A | Discharge status: Home / Self Care | Payer: Medicare Other | Source: Ambulatory Visit | Attending: General Surgery | Admitting: General Surgery

## 2019-12-24 DIAGNOSIS — Z20822 Contact with and (suspected) exposure to covid-19: Secondary | ICD-10-CM | POA: Diagnosis not present

## 2019-12-24 DIAGNOSIS — Z01812 Encounter for preprocedural laboratory examination: Secondary | ICD-10-CM | POA: Insufficient documentation

## 2019-12-24 LAB — BASIC METABOLIC PANEL
Anion gap: 10 (ref 5–15)
BUN: 19 mg/dL (ref 8–23)
CO2: 26 mmol/L (ref 22–32)
Calcium: 8.9 mg/dL (ref 8.9–10.3)
Chloride: 104 mmol/L (ref 98–111)
Creatinine, Ser: 0.63 mg/dL (ref 0.44–1.00)
GFR calc Af Amer: >60 mL/min (ref >60)
GFR calc non Af Amer: >60 mL/min (ref >60)
Glucose, Bld: 97 mg/dL (ref 70–99)
Potassium: 4.2 mmol/L (ref 3.5–5.1)
Sodium: 140 mmol/L (ref 135–145)

## 2019-12-24 LAB — SARS CORONAVIRUS 2 (TAT 6-24 HRS): SARS Coronavirus 2: NEGATIVE

## 2019-12-24 NOTE — Progress Notes (Signed)

## 2019-12-26 ENCOUNTER — Ambulatory Visit
Admission: RE | Admit: 2019-12-26 | Discharge: 2019-12-26 | Disposition: A | Discharge status: Home / Self Care | Payer: Medicare Other | Source: Ambulatory Visit | Attending: General Surgery | Admitting: General Surgery

## 2019-12-26 ENCOUNTER — Other Ambulatory Visit: Payer: Self-pay

## 2019-12-26 DIAGNOSIS — N6091 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: Secondary | ICD-10-CM

## 2019-12-27 ENCOUNTER — Other Ambulatory Visit: Payer: Self-pay

## 2019-12-27 ENCOUNTER — Encounter (HOSPITAL_BASED_OUTPATIENT_CLINIC_OR_DEPARTMENT_OTHER)
Admission: RE | Disposition: A | Discharge status: Home / Self Care | Payer: Self-pay | Source: Home / Self Care | Attending: General Surgery

## 2019-12-27 ENCOUNTER — Ambulatory Visit (HOSPITAL_BASED_OUTPATIENT_CLINIC_OR_DEPARTMENT_OTHER): Payer: Medicare Other | Admitting: Certified Registered"

## 2019-12-27 ENCOUNTER — Ambulatory Visit (HOSPITAL_BASED_OUTPATIENT_CLINIC_OR_DEPARTMENT_OTHER)
Admission: RE | Admit: 2019-12-27 | Discharge: 2019-12-27 | Disposition: A | Discharge status: Home / Self Care | Payer: Medicare Other | Attending: General Surgery | Admitting: General Surgery

## 2019-12-27 ENCOUNTER — Encounter (HOSPITAL_BASED_OUTPATIENT_CLINIC_OR_DEPARTMENT_OTHER): Payer: Self-pay | Admitting: General Surgery

## 2019-12-27 ENCOUNTER — Ambulatory Visit
Admission: RE | Admit: 2019-12-27 | Discharge: 2019-12-27 | Disposition: A | Discharge status: Home / Self Care | Payer: Medicare Other | Source: Ambulatory Visit | Attending: General Surgery | Admitting: General Surgery

## 2019-12-27 DIAGNOSIS — E119 Type 2 diabetes mellitus without complications: Secondary | ICD-10-CM | POA: Diagnosis not present

## 2019-12-27 DIAGNOSIS — F329 Major depressive disorder, single episode, unspecified: Secondary | ICD-10-CM | POA: Insufficient documentation

## 2019-12-27 DIAGNOSIS — Z6841 Body Mass Index (BMI) 40.0 and over, adult: Secondary | ICD-10-CM | POA: Diagnosis not present

## 2019-12-27 DIAGNOSIS — N6091 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: Secondary | ICD-10-CM

## 2019-12-27 DIAGNOSIS — Z803 Family history of malignant neoplasm of breast: Secondary | ICD-10-CM | POA: Diagnosis not present

## 2019-12-27 DIAGNOSIS — Z87891 Personal history of nicotine dependence: Secondary | ICD-10-CM | POA: Diagnosis not present

## 2019-12-27 DIAGNOSIS — Z888 Allergy status to other drugs, medicaments and biological substances status: Secondary | ICD-10-CM | POA: Diagnosis not present

## 2019-12-27 DIAGNOSIS — Z882 Allergy status to sulfonamides status: Secondary | ICD-10-CM | POA: Insufficient documentation

## 2019-12-27 DIAGNOSIS — D241 Benign neoplasm of right breast: Secondary | ICD-10-CM | POA: Diagnosis not present

## 2019-12-27 DIAGNOSIS — Z886 Allergy status to analgesic agent status: Secondary | ICD-10-CM | POA: Insufficient documentation

## 2019-12-27 DIAGNOSIS — Z79899 Other long term (current) drug therapy: Secondary | ICD-10-CM | POA: Insufficient documentation

## 2019-12-27 DIAGNOSIS — M1990 Unspecified osteoarthritis, unspecified site: Secondary | ICD-10-CM | POA: Insufficient documentation

## 2019-12-27 DIAGNOSIS — I10 Essential (primary) hypertension: Secondary | ICD-10-CM | POA: Insufficient documentation

## 2019-12-27 HISTORY — DX: 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: N60.91

## 2019-12-27 HISTORY — PX: BREAST LUMPECTOMY WITH RADIOACTIVE SEED LOCALIZATION: SHX6424

## 2019-12-27 SURGERY — BREAST LUMPECTOMY WITH RADIOACTIVE SEED LOCALIZATION
Anesthesia: General | Site: Breast | Laterality: Right

## 2019-12-27 MED ORDER — ACETAMINOPHEN 500 MG PO TABS
ORAL_TABLET | ORAL | Status: AC
  Filled 2019-12-27: qty 2

## 2019-12-27 MED ORDER — DEXAMETHASONE SODIUM PHOSPHATE 10 MG/ML IJ SOLN
INTRAMUSCULAR | Status: DC | PRN
  Administered 2019-12-27: 5 mg via INTRAVENOUS

## 2019-12-27 MED ORDER — FENTANYL CITRATE (PF) 100 MCG/2ML IJ SOLN: 25.0000 ug | INTRAMUSCULAR | Status: DC | PRN

## 2019-12-27 MED ORDER — ACETAMINOPHEN 500 MG PO TABS
1000.0000 mg | ORAL_TABLET | ORAL | Status: AC
  Administered 2019-12-27: 1000 mg via ORAL

## 2019-12-27 MED ORDER — PHENYLEPHRINE 40 MCG/ML (10ML) SYRINGE FOR IV PUSH (FOR BLOOD PRESSURE SUPPORT)
PREFILLED_SYRINGE | INTRAVENOUS | Status: AC
  Filled 2019-12-27: qty 10

## 2019-12-27 MED ORDER — CEFAZOLIN SODIUM-DEXTROSE 1-4 GM/50ML-% IV SOLN
INTRAVENOUS | Status: AC
  Filled 2019-12-27: qty 50

## 2019-12-27 MED ORDER — LIDOCAINE HCL (CARDIAC) PF 100 MG/5ML IV SOSY
PREFILLED_SYRINGE | INTRAVENOUS | Status: DC | PRN
  Administered 2019-12-27: 100 mg via INTRAVENOUS

## 2019-12-27 MED ORDER — FENTANYL CITRATE (PF) 100 MCG/2ML IJ SOLN
INTRAMUSCULAR | Status: DC | PRN
Start: 2019-12-27 — End: 2019-12-27
  Administered 2019-12-27 (×2): 25 ug via INTRAVENOUS

## 2019-12-27 MED ORDER — EPHEDRINE SULFATE 50 MG/ML IJ SOLN
INTRAMUSCULAR | Status: DC | PRN
  Administered 2019-12-27: 10 mg via INTRAVENOUS
  Administered 2019-12-27: 5 mg via INTRAVENOUS
  Administered 2019-12-27 (×2): 10 mg via INTRAVENOUS

## 2019-12-27 MED ORDER — LIDOCAINE 2% (20 MG/ML) 5 ML SYRINGE
INTRAMUSCULAR | Status: AC
  Filled 2019-12-27: qty 5

## 2019-12-27 MED ORDER — MIDAZOLAM HCL 5 MG/5ML IJ SOLN
INTRAMUSCULAR | Status: DC | PRN
  Administered 2019-12-27: 1 mg via INTRAVENOUS

## 2019-12-27 MED ORDER — ONDANSETRON HCL 4 MG/2ML IJ SOLN
INTRAMUSCULAR | Status: DC | PRN
  Administered 2019-12-27: 4 mg via INTRAVENOUS

## 2019-12-27 MED ORDER — GABAPENTIN 300 MG PO CAPS
300.0000 mg | ORAL_CAPSULE | ORAL | Status: AC
  Administered 2019-12-27: 300 mg via ORAL

## 2019-12-27 MED ORDER — HYDROCODONE-ACETAMINOPHEN 5-325 MG PO TABS: 1.0000 | ORAL_TABLET | Freq: Four times a day (QID) | ORAL | 0 refills | Status: DC | PRN

## 2019-12-27 MED ORDER — PROPOFOL 10 MG/ML IV BOLUS
INTRAVENOUS | Status: DC | PRN
  Administered 2019-12-27: 200 mg via INTRAVENOUS

## 2019-12-27 MED ORDER — GABAPENTIN 300 MG PO CAPS
ORAL_CAPSULE | ORAL | Status: AC
  Filled 2019-12-27: qty 1

## 2019-12-27 MED ORDER — PROPOFOL 10 MG/ML IV BOLUS
INTRAVENOUS | Status: AC
  Filled 2019-12-27: qty 20

## 2019-12-27 MED ORDER — MIDAZOLAM HCL 2 MG/2ML IJ SOLN
INTRAMUSCULAR | Status: AC
  Filled 2019-12-27: qty 2

## 2019-12-27 MED ORDER — EPHEDRINE 5 MG/ML INJ
INTRAVENOUS | Status: AC
  Filled 2019-12-27: qty 10

## 2019-12-27 MED ORDER — CEFAZOLIN SODIUM-DEXTROSE 2-4 GM/100ML-% IV SOLN
INTRAVENOUS | Status: AC
  Filled 2019-12-27: qty 100

## 2019-12-27 MED ORDER — PROMETHAZINE HCL 25 MG/ML IJ SOLN: 6.2500 mg | INTRAMUSCULAR | Status: DC | PRN

## 2019-12-27 MED ORDER — ONDANSETRON HCL 4 MG/2ML IJ SOLN
INTRAMUSCULAR | Status: AC
  Filled 2019-12-27: qty 2

## 2019-12-27 MED ORDER — OXYCODONE HCL 5 MG PO TABS: 5.0000 mg | ORAL_TABLET | Freq: Once | ORAL | Status: DC | PRN

## 2019-12-27 MED ORDER — CHLORHEXIDINE GLUCONATE CLOTH 2 % EX PADS: 6.0000 | MEDICATED_PAD | Freq: Once | CUTANEOUS | Status: DC

## 2019-12-27 MED ORDER — LACTATED RINGERS IV SOLN: INTRAVENOUS | Status: DC

## 2019-12-27 MED ORDER — FENTANYL CITRATE (PF) 100 MCG/2ML IJ SOLN
INTRAMUSCULAR | Status: AC
  Filled 2019-12-27: qty 2

## 2019-12-27 MED ORDER — BUPIVACAINE HCL (PF) 0.25 % IJ SOLN
INTRAMUSCULAR | Status: DC | PRN
  Administered 2019-12-27: 20 mL

## 2019-12-27 MED ORDER — OXYCODONE HCL 5 MG/5ML PO SOLN: 5.0000 mg | Freq: Once | ORAL | Status: DC | PRN

## 2019-12-27 MED ORDER — DEXTROSE 5 % IV SOLN
3.0000 g | INTRAVENOUS | Status: AC
  Administered 2019-12-27: 3 g via INTRAVENOUS

## 2019-12-27 SURGICAL SUPPLY — 47 items
ADH SKN CLS APL DERMABOND .7 (GAUZE/BANDAGES/DRESSINGS) ×1
APL PRP STRL LF DISP 70% ISPRP (MISCELLANEOUS) ×1
APPLIER CLIP 9.375 MED OPEN (MISCELLANEOUS)
APR CLP MED 9.3 20 MLT OPN (MISCELLANEOUS)
BLADE SURG 15 STRL LF DISP TIS (BLADE) ×1 IMPLANT
BLADE SURG 15 STRL SS (BLADE) ×2
CANISTER SUC SOCK COL 7IN (MISCELLANEOUS) ×1 IMPLANT
CANISTER SUCT 1200ML W/VALVE (MISCELLANEOUS) ×2 IMPLANT
CHLORAPREP W/TINT 26 (MISCELLANEOUS) ×2 IMPLANT
CLIP APPLIE 9.375 MED OPEN (MISCELLANEOUS) IMPLANT
COVER BACK TABLE 60X90IN (DRAPES) ×2 IMPLANT
COVER MAYO STAND STRL (DRAPES) ×2 IMPLANT
COVER PROBE W GEL 5X96 (DRAPES) ×2 IMPLANT
COVER WAND RF STERILE (DRAPES) IMPLANT
DECANTER SPIKE VIAL GLASS SM (MISCELLANEOUS) IMPLANT
DERMABOND ADVANCED (GAUZE/BANDAGES/DRESSINGS) ×1
DERMABOND ADVANCED .7 DNX12 (GAUZE/BANDAGES/DRESSINGS) ×1 IMPLANT
DRAPE LAPAROSCOPIC ABDOMINAL (DRAPES) ×2 IMPLANT
DRAPE UTILITY XL STRL (DRAPES) ×2 IMPLANT
ELECT COATED BLADE 2.86 ST (ELECTRODE) ×2 IMPLANT
ELECT REM PT RETURN 9FT ADLT (ELECTROSURGICAL) ×2
ELECTRODE REM PT RTRN 9FT ADLT (ELECTROSURGICAL) ×1 IMPLANT
GLOVE BIO SURGEON STRL SZ7.5 (GLOVE) ×4 IMPLANT
GLOVE BIOGEL PI IND STRL 6.5 (GLOVE) IMPLANT
GLOVE BIOGEL PI IND STRL 7.0 (GLOVE) IMPLANT
GLOVE BIOGEL PI INDICATOR 6.5 (GLOVE) ×1
GLOVE BIOGEL PI INDICATOR 7.0 (GLOVE) ×2
GOWN STRL REUS W/ TWL LRG LVL3 (GOWN DISPOSABLE) ×2 IMPLANT
GOWN STRL REUS W/TWL LRG LVL3 (GOWN DISPOSABLE) ×8
ILLUMINATOR WAVEGUIDE N/F (MISCELLANEOUS) IMPLANT
KIT MARKER MARGIN INK (KITS) ×2 IMPLANT
LIGHT WAVEGUIDE WIDE FLAT (MISCELLANEOUS) IMPLANT
NDL HYPO 25X1 1.5 SAFETY (NEEDLE) IMPLANT
NEEDLE HYPO 25X1 1.5 SAFETY (NEEDLE) ×2 IMPLANT
NS IRRIG 1000ML POUR BTL (IV SOLUTION) IMPLANT
PACK BASIN DAY SURGERY FS (CUSTOM PROCEDURE TRAY) ×2 IMPLANT
PENCIL SMOKE EVACUATOR (MISCELLANEOUS) ×2 IMPLANT
SLEEVE SCD COMPRESS KNEE MED (MISCELLANEOUS) ×2 IMPLANT
SPONGE LAP 18X18 RF (DISPOSABLE) ×2 IMPLANT
SUT MON AB 4-0 PC3 18 (SUTURE) ×2 IMPLANT
SUT SILK 2 0 SH (SUTURE) IMPLANT
SUT VICRYL 3-0 CR8 SH (SUTURE) ×2 IMPLANT
SYR CONTROL 10ML LL (SYRINGE) IMPLANT
TOWEL GREEN STERILE FF (TOWEL DISPOSABLE) ×2 IMPLANT
TRAY FAXITRON CT DISP (TRAY / TRAY PROCEDURE) ×2 IMPLANT
TUBE CONNECTING 20X1/4 (TUBING) ×2 IMPLANT
YANKAUER SUCT BULB TIP NO VENT (SUCTIONS) IMPLANT

## 2019-12-27 NOTE — Interval H&P Note (Signed)
History and Physical Interval Note:  12/27/2019 9:22 AM  Bridget Conway  has presented today for surgery, with the diagnosis of RIGHT BREAST ATYPICAL DUCTAL HYPERPLASIA  The various methods of treatment have been discussed with the patient and family. After consideration of risks, benefits and other options for treatment, the patient has consented to  Procedure(s): RIGHT BREAST LUMPECTOMY WITH RADIOACTIVE SEED LOCALIZATION (Right) as a surgical intervention.  The patient's history has been reviewed, patient examined, no change in status, stable for surgery.  I have reviewed the patient's chart and labs.  Questions were answered to the patient's satisfaction.     [name]

## 2019-12-27 NOTE — Anesthesia Preprocedure Evaluation (Addendum)
Anesthesia Evaluation  Patient identified by MRN, date of birth, ID band Patient awake    Reviewed: Allergy & Precautions, H&P , NPO status , Patient's Chart, lab work & pertinent test results  Airway Mallampati: II  TM Distance: >3 FB Neck ROM: full    Dental  (+) Dental Advisory Given, Partial Lower, Partial Upper   Pulmonary neg pulmonary ROS, former smoker,    breath sounds clear to auscultation       Cardiovascular hypertension, On Medications  Rhythm:regular     Neuro/Psych PSYCHIATRIC DISORDERS Anxiety Depression negative neurological ROS     GI/Hepatic Neg liver ROS, GERD  Medicated,  Endo/Other  diabetes, Oral Hypoglycemic AgentsMorbid obesity  Renal/GU negative Renal ROS  negative genitourinary   Musculoskeletal   Abdominal (+) + obese,   Peds  Hematology negative hematology ROS (+)   Anesthesia Other Findings See surgeon's H&P   Reproductive/Obstetrics negative OB ROS                            Anesthesia Physical  Anesthesia Plan  ASA: III  Anesthesia Plan: General   Post-op Pain Management:    Induction: Intravenous  PONV Risk Score and Plan: 4 or greater and Ondansetron, Dexamethasone, Treatment may vary due to age or medical condition and Midazolam  Airway Management Planned: LMA  Additional Equipment: None  Intra-op Plan:   Post-operative Plan: Extubation in OR  Informed Consent: I have reviewed the patients History and Physical, chart, labs and discussed the procedure including the risks, benefits and alternatives for the proposed anesthesia with the patient or authorized representative who has indicated his/her understanding and acceptance.     Dental advisory given  Plan Discussed with: CRNA  Anesthesia Plan Comments:        Anesthesia Quick Evaluation

## 2019-12-27 NOTE — Transfer of Care (Signed)
Immediate Anesthesia Transfer of Care Note  Patient: Bridget Conway  Procedure(s) Performed: RIGHT BREAST LUMPECTOMY WITH RADIOACTIVE SEED LOCALIZATION (Right Breast)  Patient Location: PACU  Anesthesia Type:General  Level of Consciousness: awake, alert  and oriented  Airway & Oxygen Therapy: Patient Spontanous Breathing and Patient connected to face mask oxygen  Post-op Assessment: Report given to RN and Post -op Vital signs reviewed and stable  Post vital signs: Reviewed and stable  Last Vitals:  Vitals Value Taken Time  BP 132/71 12/27/19 1035  Temp 36.7 C 12/27/19 1035  Pulse 84 12/27/19 1035  Resp 14 12/27/19 1035  SpO2 100 % 12/27/19 1035  Vitals shown include unvalidated device data.  Last Pain:  Vitals:   12/27/19 0844  TempSrc: Oral  PainSc: 0-No pain         Complications: No complications documented.

## 2019-12-27 NOTE — Op Note (Signed)
12/27/2019  10:28 AM  PATIENT:  Bridget Conway  64 y.o. female  PRE-OPERATIVE DIAGNOSIS:  RIGHT BREAST ATYPICAL DUCTAL LUMPECTOMY  POST-OPERATIVE DIAGNOSIS:  RIGHT BREAST ATYPICAL DUCTAL LUMPECTOMY  PROCEDURE:  Procedure(s): RIGHT BREAST LUMPECTOMY WITH RADIOACTIVE SEED LOCALIZATION (Right)  SURGEON:  Surgeon(s) and Role:    Jovita Kussmaul, MD - Primary  PHYSICIAN ASSISTANT:   ASSISTANTS: none   ANESTHESIA:   local and general  EBL:  10 mL   BLOOD ADMINISTERED:none  DRAINS: none   LOCAL MEDICATIONS USED:  MARCAINE     SPECIMEN:  Source of Specimen:  right breast tissue  DISPOSITION OF SPECIMEN:  PATHOLOGY  COUNTS:  YES  TOURNIQUET:  * No tourniquets in log *  DICTATION: .Dragon Dictation   After informed consent was obtained the patient was brought to the operating room and placed in the supine position on the operating table.  After adequate induction of general anesthesia the patient's right breast was prepped with ChloraPrep, allowed to dry, and draped in usual sterile manner.  An appropriate timeout was performed.  Previously an I-125 seed was placed in the upper central portion of the right breast to mark an area of atypical ductal hyperplasia.  The neoprobe was set to I-125 in the area of radioactivity was readily identified.  The area around this was infiltrated with quarter percent Marcaine.  A curvilinear incision was made along the upper edge of the areola of the right breast with a 15 blade knife.  The incision was carried through the skin and subcutaneous tissue sharply with the electrocautery.  Dissection was then carried towards the radioactive seed under the direction of the neoprobe.  Once I more closely approached the radioactive seed and then removed a circular portion of breast tissue sharply with the electrocautery around the radioactive seed while checking the area of radioactivity frequently.  Once the specimen was removed it was oriented with the  appropriate paint colors.  A specimen radiograph was obtained that showed the clip and seed to be within the specimen.  The specimen was then sent to pathology for further evaluation.  Hemostasis was achieved using the Bovie electrocautery.  The wound was irrigated with saline and infiltrated with more quarter percent Marcaine.  The deep layer of the wound was then closed with layers of interrupted 3-0 Vicryl stitches.  The skin was closed with interrupted 4-0 Monocryl subcuticular stitches.  Dermabond dressings were applied.  The patient tolerated the procedure well.  At the end of the case all needle sponge and instrument counts were correct.  The patient was then awakened and taken to recovery in stable condition.  PLAN OF CARE: Discharge to home after PACU  PATIENT DISPOSITION:  PACU - hemodynamically stable.   Delay start of Pharmacological VTE agent (>24hrs) due to surgical blood loss or risk of bleeding: not applicable

## 2019-12-27 NOTE — Anesthesia Procedure Notes (Signed)
Procedure Name: LMA Insertion Date/Time: 12/27/2019 9:48 AM Performed by: Lavonia Dana, CRNA Pre-anesthesia Checklist: Patient identified, Emergency Drugs available, Suction available and Patient being monitored Patient Re-evaluated:Patient Re-evaluated prior to induction Oxygen Delivery Method: Circle system utilized Preoxygenation: Pre-oxygenation with 100% oxygen Induction Type: IV induction Ventilation: Mask ventilation without difficulty LMA: LMA inserted LMA Size: 4.0 Number of attempts: 1 Airway Equipment and Method: Bite block Placement Confirmation: positive ETCO2 Tube secured with: Tape Dental Injury: Teeth and Oropharynx as per pre-operative assessment

## 2019-12-27 NOTE — Anesthesia Postprocedure Evaluation (Signed)
Anesthesia Post Note  Patient: Bridget Conway  Procedure(s) Performed: RIGHT BREAST LUMPECTOMY WITH RADIOACTIVE SEED LOCALIZATION (Right Breast)     Patient location during evaluation: PACU Anesthesia Type: General Level of consciousness: sedated and patient cooperative Pain management: pain level controlled Vital Signs Assessment: post-procedure vital signs reviewed and stable Respiratory status: spontaneous breathing Cardiovascular status: stable Anesthetic complications: no   No complications documented.  Last Vitals:  Vitals:   12/27/19 1112 12/27/19 1145  BP: 125/73 132/72  Pulse: 81 83  Resp: 14   Temp:  36.4 C  SpO2: 95% 95%    Last Pain:  Vitals:   12/27/19 1145  TempSrc:   PainSc: [place]

## 2019-12-27 NOTE — Discharge Instructions (Signed)
  Post Anesthesia Home Care Instructions  Activity: Get plenty of rest for the remainder of the day. A responsible individual must stay with you for 24 hours following the procedure.  For the next 24 hours, DO NOT: -Drive a car -Paediatric nurse -Drink alcoholic beverages -Take any medication unless instructed by your physician -Make any legal decisions or sign important papers.  Meals: Start with liquid foods such as gelatin or soup. Progress to regular foods as tolerated. Avoid greasy, spicy, heavy foods. If nausea and/or vomiting occur, drink only clear liquids until the nausea and/or vomiting subsides. Call your physician if vomiting continues.  Special Instructions/Symptoms: Your throat may feel dry or sore from the anesthesia or the breathing tube placed in your throat during surgery. If this causes discomfort, gargle with warm salt water. The discomfort should disappear within 24 hours.  Next dose of Tylenol can be given at 3:00pm if needed.

## 2019-12-27 NOTE — H&P (Signed)
Bridget Conway  Location: [facility] Patient #: [id number] DOB: Aug 09, 1955 Divorced / Language: Bridget Conway / Race: White Female   History of Present Illness  The patient is a 64 year old female who presents with a complaint of Breast problems. We are asked to see the patient in consultation by Dr. 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 The patient is a 64 year old white female who recently went for a routine screening mammogram. At that time she was found to have a 1.2 cm cluster of calcifications in the upper central right breast that was abnormal. This was biopsied and came back as atypical duct hyperplasia. She is otherwise in pretty good health and does not smoke. She does have a family history of breast cancer in her mother at the age of 68.   Past Surgical History Breast Biopsy  Right. Knee Surgery  Left.  Diagnostic Studies History  Colonoscopy  5-10 years ago Mammogram  within last year Pap Smear  1-5 years ago  Allergies NSAIDs  Sulfa Antibiotics  Statins  Adhesive Tape  Allergies Reconciled   Medication History  Furosemide (20MG  Tablet, Oral) Active. Potassium Chloride ER (10MEQ Tablet ER, Oral) Active. Sertraline HCl (100MG  Tablet, Oral) Active. Topiramate (25MG  Tablet, Oral) Active. Medications Reconciled  Pregnancy / Birth History Age at menarche  63 years. Age of menopause  50-50 Gravida  1 Maternal age  66-20 Para  1  Other Problems  Arthritis  Back Pain  Depression  Gastroesophageal Reflux Disease  Heart murmur  High blood pressure  Hypercholesterolemia  Kidney Stone  Melanoma     Review of Systems  General Not Present- Appetite Loss, Chills, Fatigue, Fever, Night Sweats, Weight Gain and Weight Loss. Note: All other systems negative (unless as noted in HPI & included Review of Systems) Skin Not Present- Change in Wart/Mole, Dryness, Hives, Jaundice, New  Lesions, Non-Healing Wounds, Rash and Ulcer. HEENT Not Present- Earache, Hearing Loss, Hoarseness, Nose Bleed, Oral Ulcers, Ringing in the Ears, Seasonal Allergies, Sinus Pain, Sore Throat, Visual Disturbances, Wears glasses/contact lenses and Yellow Eyes. Respiratory Not Present- Bloody sputum, Chronic Cough, Difficulty Breathing, Snoring and Wheezing. Breast Not Present- Breast Mass, Breast Pain, Nipple Discharge and Skin Changes. Cardiovascular Not Present- Chest Pain, Difficulty Breathing Lying Down, Leg Cramps, Palpitations, Rapid Heart Rate, Shortness of Breath and Swelling of Extremities. Gastrointestinal Not Present- Abdominal Pain, Bloating, Bloody Stool, Change in Bowel Habits, Chronic diarrhea, Constipation, Difficulty Swallowing, Excessive gas, Gets full quickly at meals, Hemorrhoids, Indigestion, Nausea, Rectal Pain and Vomiting. Female Genitourinary Not Present- Frequency, Nocturia, Painful Urination, Pelvic Pain and Urgency. Musculoskeletal Not Present- Back Pain, Joint Pain, Joint Stiffness, Muscle Pain, Muscle Weakness and Swelling of Extremities. Neurological Not Present- Decreased Memory, Fainting, Headaches, Numbness, Seizures, Tingling, Tremor, Trouble walking and Weakness. Psychiatric Not Present- Anxiety, Bipolar, Change in Sleep Pattern, Depression, Fearful and Frequent crying. Endocrine Not Present- Cold Intolerance, Excessive Hunger, Hair Changes, Heat Intolerance, Hot flashes and New Diabetes. Hematology Not Present- Easy Bruising, Excessive bleeding, Gland problems, HIV and Persistent Infections.  Vitals  Weight: 269.6 lb Height: 65in Body Surface Area: 2.25 m Body Mass Index: 44.86 kg/m  Temp.: 97.24F  Pulse: 88 (Regular)  BP: 128/86(Sitting, Left Arm, Standard)       Physical Exam  General Mental Status-Alert. General Appearance-Consistent with stated age. Hydration-Well hydrated. Voice-Normal.  Head and Neck Head-normocephalic,  atraumatic with no lesions or palpable masses. Trachea-midline. Thyroid Gland Characteristics - normal size and consistency.  Eye  Eyeball - Bilateral-Extraocular movements intact. Sclera/Conjunctiva - Bilateral-No scleral icterus.  Chest and Lung Exam Chest and lung exam reveals -quiet, even and easy respiratory effort with no use of accessory muscles and on auscultation, normal breath sounds, no adventitious sounds and normal vocal resonance. Inspection Chest Wall - Normal. Back - normal.  Breast Note: There is a palpable bruise in the upper central right breast. Other than this there is no other palpable mass in either breast. There is no palpable axillary, supraclavicular, or cervical lymphadenopathy.   Cardiovascular Cardiovascular examination reveals -normal heart sounds, regular rate and rhythm with no murmurs and normal pedal pulses bilaterally.  Abdomen Inspection Inspection of the abdomen reveals - No Hernias. Skin - Scar - no surgical scars. Palpation/Percussion Palpation and Percussion of the abdomen reveal - Soft, Non Tender, No Rebound tenderness, No Rigidity (guarding) and No hepatosplenomegaly. Auscultation Auscultation of the abdomen reveals - Bowel sounds normal.  Neurologic Neurologic evaluation reveals -alert and oriented x 3 with no impairment of recent or remote memory. Mental Status-Normal.  Musculoskeletal Normal Exam - Left-Upper Extremity Strength Normal and Lower Extremity Strength Normal. Normal Exam - Right-Upper Extremity Strength Normal and Lower Extremity Strength Normal.  Lymphatic Head & Neck  General Head & Neck Lymphatics: Bilateral - Description - Normal. Axillary  General Axillary Region: Bilateral - Description - Normal. Tenderness - Non Tender. Femoral & Inguinal  Generalized Femoral & Inguinal Lymphatics: Bilateral - Description - Normal. Tenderness - Non Tender.    Assessment & Plan ATYPICAL DUCTAL  HYPERPLASIA OF RIGHT BREAST (N60.91) Impression: The patient appears to have a 1.2 cm area of atypical ductal hyperplasia in the upper central right breast. Because ADH can have an appearance very similar to ductal carcinoma in situ and because it is considered a high risk lesion would recommend that this area be removed. She would also like to have this done. I have discussed with her in detail the risks and benefits of the operation as well as some of the technical aspects including the use of a radioactive seed for localization and she understands and wishes to proceed. I will also refer her to the high-risk clinic at the cancer center to talk about risk reduction. The presence of ADH confers a lifetime risk of breast cancer approximately 28% for her. This would also likely qualify her for enhanced screening with MRI in addition to a mammogram every year. This patient encounter took 60 minutes today to perform the following: take history, perform exam, review outside records, interpret imaging, counsel the patient on their diagnosis and document encounter, findings & plan in the EHR Current Plans Referred to Oncology, for evaluation and follow up (Oncology). Routine.

## 2019-12-30 ENCOUNTER — Encounter (HOSPITAL_BASED_OUTPATIENT_CLINIC_OR_DEPARTMENT_OTHER): Payer: Self-pay | Admitting: General Surgery

## 2019-12-30 LAB — SURGICAL PATHOLOGY

## 2020-01-02 NOTE — Progress Notes (Signed)
Hello hi Bridget Conway how to do do with the surgery # HEMATOLOGY-ONCOLOGY [facility] VIDEO VISIT PROGRESS NOTE  I connected with Bridget Conway on 01/03/2020 at 11:00 AM EDT by MyChart video conference and verified that I am speaking with the correct person using two identifiers.  I discussed the limitations, risks, security and privacy concerns of performing an evaluation and management service by MyChart and the availability of in person appointments.  I also discussed with the patient that there may be a patient responsible charge related to this service. The patient expressed understanding and agreed to proceed.  Patient's Location: Home Physician Location: Clinic  CHIEF COMPLIANT: Follow-up of high risk for breast cancer  INTERVAL HISTORY: Bridget Conway is a 64 y.o. female with above-mentioned history of high risk for breast cancer. She underwent a right lumpectomy on 12/27/19 with Dr. 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 She presents over MyChart today for follow-up.  She did very well with surgery and does not have any pain or discomfort.   Observations/Objective:  There were no vitals filed for this visit. There is no height or weight on file to calculate BMI.  I have reviewed the data as listed CMP Latest Ref Rng & Units 12/24/2019 12/19/2013 07/11/2013  Glucose 70 - 99 mg/dL 97 125(H) 115(H)  BUN 8 - 23 mg/dL 19 17 21   Creatinine 0.44 - 1.00 mg/dL 0.63 0.71 0.70  Sodium 135 - 145 mmol/L 140 139 142  Potassium 3.5 - 5.1 mmol/L 4.2 4.1 3.3(L)  Chloride 98 - 111 mmol/L 104 94(L) 99  CO2 22 - 32 mmol/L 26 28 -  Calcium 8.9 - 10.3 mg/dL 8.9 9.4 -  Total Protein 6.0 - 8.5 g/dL - 7.4 -  Total Bilirubin 0.0 - 1.2 mg/dL - 0.3 -  Alkaline Phos 39 - 117 IU/L - 68 -  AST 0 - 40 IU/L - 18 -  ALT 0 - 32 IU/L - 16 -    Lab Results  Component Value Date   WBC 5.2 06/30/2008   HGB 14.6  07/11/2013   HCT 43.0 07/11/2013   MCV 91.9 06/30/2008   PLT 235 06/30/2008   NEUTROABS 3.1 06/30/2008      Assessment Plan:  Atypical ductal hyperplasia of right breast Mammogram 10/10/2019: Indeterminate calcifications spanning 1.2 cm superior central right breast Right breast biopsy 10/17/2019: Focal atypical ductal hyperplasia, microscopic intraductal papilloma with calcifications 12/27/2019: Right lumpectomy: Ductal papilloma with focal ADH, margins negative, no evidence of carcinoma  Prognosis: Bridget Conway calculator predicts a 22.5% lifetime risk of breast cancer.  10-year risk of breast cancer at 14%.  Patient did not want to take tamoxifen because of sensitivity to medications and personal preference. Surveillance plan: Annual mammograms and breast MRIs. Next breast MRI will be done in February 2022.  (She will need lorazepam prior to MRI because of claustrophobia)  Return to clinic in 1 year for follow-up    I discussed the assessment and treatment plan with the patient. The patient was provided an opportunity to ask questions and all were answered. The patient agreed with the plan and demonstrated an understanding of the instructions. The patient was advised to call back or seek an in-person evaluation if the symptoms worsen or if the condition fails to improve as anticipated.   I provided 20 minutes of face-to-face MyChart video visit time during this encounter.    Rulon Eisenmenger, MD 01/03/2020  I, Molly Dorshimer, am acting as scribe for Nicholas Lose, MD.  I have reviewed the above documentation for accuracy and completeness, and I agree with the above.

## 2020-01-03 ENCOUNTER — Inpatient Hospital Stay: Payer: Medicare Other | Attending: Hematology and Oncology | Admitting: Hematology and Oncology

## 2020-01-03 DIAGNOSIS — N6091 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: Secondary | ICD-10-CM | POA: Diagnosis not present

## 2020-01-03 NOTE — Assessment & Plan Note (Signed)
Mammogram 10/10/2019: Indeterminate calcifications spanning 1.2 cm superior central right breast Right breast biopsy 10/17/2019: Focal atypical ductal hyperplasia, microscopic intraductal papilloma with calcifications 12/27/2019: Right lumpectomy: Ductal papilloma with focal ADH, margins negative, no evidence of carcinoma  Prognosis: Bridget Conway calculator predicts a 22.5% lifetime risk of breast cancer.  10-year risk of breast cancer at 14%.  Patient did not want to take tamoxifen because of sensitivity to medications and personal preference. Surveillance plan: Annual mammograms and breast MRIs. Next breast MRI will be done in February 2022.  (She will need lorazepam prior to MRI because of claustrophobia)  Return to clinic in 1 year for follow-up

## 2020-01-07 ENCOUNTER — Telehealth: Payer: Self-pay | Admitting: Hematology and Oncology

## 2020-01-07 NOTE — Telephone Encounter (Signed)
Scheduled appts per 9/24 los. Pt confirmed appt date and time.

## 2020-01-27 ENCOUNTER — Ambulatory Visit (INDEPENDENT_AMBULATORY_CARE_PROVIDER_SITE_OTHER): Payer: Medicare Other | Admitting: Nurse Practitioner

## 2020-01-27 ENCOUNTER — Encounter: Payer: Self-pay | Admitting: Nurse Practitioner

## 2020-01-27 ENCOUNTER — Other Ambulatory Visit: Payer: Self-pay

## 2020-01-27 VITALS — BP 146/83 | HR 73 | Temp 97.7°F | Ht 65.0 in | Wt 276.4 lb

## 2020-01-27 DIAGNOSIS — I119 Hypertensive heart disease without heart failure: Secondary | ICD-10-CM

## 2020-01-27 DIAGNOSIS — Z23 Encounter for immunization: Secondary | ICD-10-CM | POA: Diagnosis not present

## 2020-01-27 DIAGNOSIS — F331 Major depressive disorder, recurrent, moderate: Secondary | ICD-10-CM

## 2020-01-27 DIAGNOSIS — Z0000 Encounter for general adult medical examination without abnormal findings: Secondary | ICD-10-CM | POA: Diagnosis not present

## 2020-01-27 MED ORDER — POTASSIUM CHLORIDE ER 10 MEQ PO TBCR: EXTENDED_RELEASE_TABLET | ORAL | 1 refills | Status: DC

## 2020-01-27 MED ORDER — SERTRALINE HCL 100 MG PO TABS: ORAL_TABLET | ORAL | 1 refills | Status: DC

## 2020-01-27 MED ORDER — FUROSEMIDE 20 MG PO TABS: 20.0000 mg | ORAL_TABLET | Freq: Every day | ORAL | 1 refills | Status: DC

## 2020-01-27 NOTE — Assessment & Plan Note (Signed)
Patient well-controlled on current medication no changes necessary.

## 2020-01-27 NOTE — Assessment & Plan Note (Signed)
Well-controlled on current medication no changes necessary. Rx refill sent to pharmacy.

## 2020-01-27 NOTE — Progress Notes (Signed)
New Patient Office Visit  Subjective:  Patient ID: Bridget Conway, female    DOB: Nov 24, 1955  Age: 64 y.o. MRN: [id number]  CC:  Chief Complaint  Patient presents with  . New Patient (Initial Visit)    HPI Bridget Conway presents for .   Encounter for general adult medical examination without abnormal findings  Physical: Patient's last physical exam was 1 year ago .  Weight:  Not appropriate for height (BMI greater than 27%) ;  Blood Pressure: Normal (BP greater than 120/80) ; 146/83 Medical History: Patient history reviewed ; Family history reviewed ;  Allergies Reviewed: No change in current allergies ;  Medications Reviewed: Medications reviewed - no changes ;  Lipids:  lipid levels ; labs completed results pending Smoking: Life-long non-smoker ;  Physical Activity: Exercises at least 3 times per week ; ( full body work out Social worker) Theatre stage manager Use: Is a non-drinker ; No illicit drug use ;  Patient is not afflicted from Stress Incontinence and Urge Incontinence  Safety: reviewed ; Patient wears a seat belt, has smoke detectors, has carbon monoxide detectors, practices appropriate gun safety, and wears sunscreen with extended sun exposure. Dental Care: annual cleanings, brushes and flosses daily. Ophthalmology/Optometry: Annual visit.  Hearing loss: none Vision impairments: none  Past Medical History:  Diagnosis Date  . Allergy   . Anxiety   . Atypical ductal hyperplasia of right breast   . Cancer (HCC)    basal cell CA- below eye  . Depression   . GERD (gastroesophageal reflux disease)   . Hypertension    no meds, has white coat syndrome  . IFG (impaired fasting glucose) 2014  . Osteoporosis   . Wears glasses   . Wears partial dentures    upper partial    Past Surgical History:  Procedure Laterality Date  . ABDOMINAL HYSTERECTOMY    . BREAST LUMPECTOMY WITH RADIOACTIVE SEED LOCALIZATION Right 12/27/2019   Procedure: RIGHT BREAST LUMPECTOMY WITH  RADIOACTIVE SEED LOCALIZATION;  Surgeon: Jovita Kussmaul, MD;  Location: [facility];  Service: General;  Laterality: Right;  . CARPAL TUNNEL RELEASE Left 07/11/2013   Procedure: LEFT CARPAL TUNNEL RELEASE;  Surgeon: Tennis Must, MD;  Location: [facility];  Service: Orthopedics;  Laterality: Left;  . carpel tunnel  2005   right wrist  . COLONOSCOPY    . hysterectomy  2002  . KNEE SURGERY Left   . LAPAROSCOPIC GASTRIC BANDING  2010    Family History  Problem Relation Age of Onset  . Breast cancer Mother 19  . Heart attack Father   . Colon cancer Neg Hx   . Esophageal cancer Neg Hx   . Rectal cancer Neg Hx   . Stomach cancer Neg Hx     Social History   Socioeconomic History  . Marital status: Divorced    Spouse name: Not on file  . Number of children: 1  . Years of education: Not on file  . Highest education level: Not on file  Occupational History  . Not on file  Tobacco Use  . Smoking status: Former Smoker    Quit date: 07/09/2000    Years since quitting: 19.5  . Smokeless tobacco: Never Used  Vaping Use  . Vaping Use: Never used  Substance and Sexual Activity  . Alcohol use: No  . Drug use: No  . Sexual activity: Not on file  Other Topics Concern  . Not on file  Social History Narrative  .  Not on file   Social Determinants of Health   Financial Resource Strain:   .   Food Insecurity:   .   Marland Kitchen   Transportation Needs:   .   Marland Kitchen   Physical Activity:   .   Marland Kitchen   Stress:   .   Social Connections:   .   .   .   .   .   .   Intimate Partner Violence:   .   .   .   .     ROS Review of Systems  Constitutional: Negative.   HENT: Negative.   Eyes: Negative.   Respiratory: Negative.   Cardiovascular: Negative.   Gastrointestinal: Negative.   Genitourinary: Negative.   Musculoskeletal: Negative.   Skin: Negative.   Neurological: Negative.   Psychiatric/Behavioral: Negative.     Objective:   Today's Vitals: BP (!)  146/83   Pulse 73   Temp 97.7 F (36.5 C) (Temporal)   Ht 5\' 5"  (1.651 m)   Wt 276 lb 6.4 oz (125.4 kg)   SpO2 93%   BMI 46.00 kg/m   Physical Exam Vitals reviewed.  Constitutional:      Appearance: Normal appearance. She is obese.  HENT:     Head: Normocephalic.     Right Ear: External ear normal. There is no impacted cerumen.     Left Ear: External ear normal. There is no impacted cerumen.     Mouth/Throat:     Mouth: Mucous membranes are moist.     Pharynx: Oropharynx is clear.  Eyes:     Conjunctiva/sclera: Conjunctivae normal.  Cardiovascular:     Rate and Rhythm: Normal rate and regular rhythm.     Pulses: Normal pulses.     Heart sounds: Normal heart sounds.  Pulmonary:     Effort: Pulmonary effort is normal.     Breath sounds: Normal breath sounds.  Abdominal:     General: Bowel sounds are normal.     Tenderness: There is no abdominal tenderness.  Musculoskeletal:        General: No tenderness. Normal range of motion.     Cervical back: Normal range of motion. No rigidity or tenderness.  Skin:    General: Skin is warm.  Neurological:     Mental Status: She is alert and oriented to person, place, and time.  Psychiatric:        Mood and Affect: Mood normal.        Behavior: Behavior normal.     Assessment & Plan:   Problem List Items Addressed This Visit      Cardiovascular and Mediastinum   Hypertension with heart disease    Well-controlled on current medication no changes necessary. Rx refill sent to pharmacy.      Relevant Medications   furosemide (LASIX) 20 MG tablet   potassium chloride (KLOR-CON) 10 MEQ tablet     Other   Depression    Patient well-controlled on current medication no changes necessary.      Relevant Medications   sertraline (ZOLOFT) 100 MG tablet   Annual physical exam - Primary    Patient is a 64 year old female who presents to clinic for annual physical exam.  Patient history atypical ductal hyperplasia of right  breast.  Depression, GERD, hypertension, and osteoporosis.  Completed head to toe assessment.  Patient has no new concerns.  Provided education for health maintenance and preventative health.  Completed vaccines.  Patient up-to-date with pneumonia vaccine, mammogram  and colonoscopy.  Patient no longer has Pap due to total hysterectomy in the past.  Labs completed today CBC, hep C screening, and lipid panel.  Printed handouts given Follow-up in 1 year.      Relevant Orders   Lipid Panel   CBC with Differential   Hepatitis C antibody    Other Visit Diagnoses    Need for immunization against influenza       Relevant Orders   Flu Vaccine QUAD High Dose(Fluad) (Completed)      Outpatient Encounter Medications as of 01/27/2020  Medication Sig  . calcium carbonate (OSCAL) 1500 (600 Ca) MG TABS tablet Take by mouth.  . fish oil-omega-3 fatty acids 1000 MG capsule Take 1,200 mg by mouth daily.  . Flaxseed, Linseed, (FLAXSEED OIL PO) Take 2,400 mg by mouth daily.  . fluticasone (FLONASE) 50 MCG/ACT nasal spray   . furosemide (LASIX) 20 MG tablet Take 1 tablet (20 mg total) by mouth daily.  Marland Kitchen MELATONIN ER PO Take by mouth daily.  . Multiple Vitamins-Minerals (MULTIVITAMIN PO) Take by mouth daily.  . potassium chloride (KLOR-CON) 10 MEQ tablet TAKE ONE (1) TABLET EACH DAY  . Probiotic Product (PROBIOTIC DAILY PO) Take by mouth daily.  . sertraline (ZOLOFT) 100 MG tablet TAKE ONE (1) TABLET EACH DAY  . [DISCONTINUED] furosemide (LASIX) 20 MG tablet Take 20 mg by mouth 2 (two) times a day.  . [DISCONTINUED] HYDROcodone-acetaminophen (NORCO/VICODIN) 5-325 MG tablet Take 1-2 tablets by mouth every 6 (six) hours as needed for moderate pain or severe pain.  . [DISCONTINUED] potassium chloride (KLOR-CON) 10 MEQ tablet TAKE ONE (1) TABLET EACH DAY  . [DISCONTINUED] POTASSIUM PO Take by mouth daily.  . [DISCONTINUED] sertraline (ZOLOFT) 100 MG tablet TAKE ONE (1) TABLET EACH DAY  . [DISCONTINUED]  sertraline (ZOLOFT) 50 MG tablet TAKE ONE (1) TABLET EACH DAY  . Turmeric 500 MG CAPS Take by mouth.  Marland Kitchen UNABLE TO FIND 2,000 mg by [facility] route 2 (two) times daily. Glucosamine   No facility-administered encounter medications on file as of 01/27/2020.    Follow-up: Return if symptoms worsen or fail to improve.   Ivy [name], NP

## 2020-01-27 NOTE — Assessment & Plan Note (Addendum)
Patient is a 64 year old female who presents to clinic for annual physical exam.  Patient history atypical ductal hyperplasia of right breast.  Depression, GERD, hypertension, and osteoporosis.  Completed head to toe assessment.  Patient has no new concerns.  Provided education for health maintenance and preventative health.  Completed vaccines.  Patient up-to-date with pneumonia vaccine, mammogram and colonoscopy.  Patient no longer has Pap due to total hysterectomy in the past.  Labs completed today CBC, hep C screening, and lipid panel.  Printed handouts given Follow-up in 1 year.

## 2020-01-27 NOTE — Patient Instructions (Signed)

## 2020-01-28 ENCOUNTER — Other Ambulatory Visit: Payer: Medicare Other

## 2020-01-28 DIAGNOSIS — Z0000 Encounter for general adult medical examination without abnormal findings: Secondary | ICD-10-CM | POA: Diagnosis not present

## 2020-01-28 DIAGNOSIS — Z7289 Other problems related to lifestyle: Secondary | ICD-10-CM | POA: Diagnosis not present

## 2020-01-28 DIAGNOSIS — Z136 Encounter for screening for cardiovascular disorders: Secondary | ICD-10-CM | POA: Diagnosis not present

## 2020-01-29 LAB — LIPID PANEL
Chol/HDL Ratio: 4.9 ratio — ABNORMAL HIGH (ref 0.0–4.4)
Cholesterol, Total: 227 mg/dL — ABNORMAL HIGH (ref 100–199)
HDL: 46 mg/dL (ref >39)
LDL Chol Calc (NIH): 158 mg/dL — ABNORMAL HIGH (ref 0–99)
Triglycerides: 127 mg/dL (ref 0–149)
VLDL Cholesterol Cal: 23 mg/dL (ref 5–40)

## 2020-01-29 LAB — CBC WITH DIFFERENTIAL/PLATELET
Basophils Absolute: 0.1 10*3/uL (ref 0.0–0.2)
Basos: 1 %
EOS (ABSOLUTE): 0.1 10*3/uL (ref 0.0–0.4)
Eos: 2 %
Hematocrit: 40.7 % (ref 34.0–46.6)
Hemoglobin: 14 g/dL (ref 11.1–15.9)
Immature Grans (Abs): 0 10*3/uL (ref 0.0–0.1)
Immature Granulocytes: 0 %
Lymphocytes Absolute: 1.8 10*3/uL (ref 0.7–3.1)
Lymphs: 32 %
MCH: 32.5 pg (ref 26.6–33.0)
MCHC: 34.4 g/dL (ref 31.5–35.7)
MCV: 94 fL (ref 79–97)
Monocytes Absolute: 0.5 10*3/uL (ref 0.1–0.9)
Monocytes: 8 %
Neutrophils Absolute: 3.2 10*3/uL (ref 1.4–7.0)
Neutrophils: 57 %
Platelets: 253 10*3/uL (ref 150–450)
RBC: 4.31 x10E6/uL (ref 3.77–5.28)
RDW: 12.1 % (ref 11.7–15.4)
WBC: 5.6 10*3/uL (ref 3.4–10.8)

## 2020-01-29 LAB — HEPATITIS C ANTIBODY: Hep C Virus Ab: 0.2 s/co ratio (ref 0.0–0.9)

## 2020-01-30 ENCOUNTER — Other Ambulatory Visit (INDEPENDENT_AMBULATORY_CARE_PROVIDER_SITE_OTHER): Payer: Medicare Other | Admitting: Nurse Practitioner

## 2020-01-30 MED ORDER — POTASSIUM CHLORIDE ER 10 MEQ PO TBCR: 10.0000 meq | EXTENDED_RELEASE_TABLET | Freq: Two times a day (BID) | ORAL | 0 refills | Status: DC

## 2020-02-03 ENCOUNTER — Encounter: Payer: Self-pay | Admitting: Nurse Practitioner

## 2020-02-03 ENCOUNTER — Ambulatory Visit (INDEPENDENT_AMBULATORY_CARE_PROVIDER_SITE_OTHER): Payer: Medicare Other | Admitting: Nurse Practitioner

## 2020-02-03 ENCOUNTER — Other Ambulatory Visit: Payer: Self-pay

## 2020-02-03 VITALS — BP 128/76 | HR 81 | Temp 96.7°F | Ht 65.0 in | Wt 277.6 lb

## 2020-02-03 DIAGNOSIS — B079 Viral wart, unspecified: Secondary | ICD-10-CM

## 2020-02-03 NOTE — Assessment & Plan Note (Signed)
Wart frozen using liquid nitrogen.  Patient tolerated procedure well.  We will continue to monitor.  Provided education with printed handouts given.  Follow-up with unresolved or worsening symptoms.

## 2020-02-03 NOTE — Patient Instructions (Signed)
Warts  Warts are small growths on the skin. They are common, and they are caused by a virus. Warts can be found on many parts of the body. A person may have one wart or many warts. Most warts will go away on their own with time, but this could take many months to a few years. Treatments may be done if needed. What are the causes? Warts are caused by a type of virus that is called HPV.  This virus can spread from person to person through touching.  Warts can also spread to other parts of the body when a person scratches a wart and then scratches normal skin. What increases the risk? You are more likely to get warts if:  You are 40-61 years old.  You have a weak body defense system (immune system).  You are Caucasian. What are the signs or symptoms? The main symptom of this condition is small growths on the skin. Warts may:  Be round, oval, or have an uneven shape.  Feel rough to the touch.  Be the color of your skin or light yellow, brown, or gray.  Often be less than  inch (1.3 cm) in size.  Go away and then come back again. Most warts do not hurt, but some can hurt if they are large or if they are on the bottom of your feet. How is this diagnosed? A wart can often be diagnosed by how it looks. In some cases, the doctor might remove a little bit of the wart to test it (biopsy). How is this treated? Most of the time, warts do not need treatment. Sometimes people want warts removed. If treatment is needed or wanted, options may include:  Putting creams or patches with medicine in them on the wart.  Putting duct tape over the top of the wart.  Freezing the wart.  Burning the wart with: ? A laser. ? An electric probe.  Giving a shot of medicine into the wart to help the body's defense system fight off the wart.  Surgery to remove the wart. Follow these instructions at home:  Medicines  Apply over-the-counter and prescription medicines only as told by your  doctor.  Do not apply over-the-counter wart medicines to your face or genitals before you ask your doctor if it is okay to do that. Lifestyle  Keep your body's defense system healthy. To do this: ? Eat a healthy diet. ? Get enough sleep. ? Do not use any products that contain nicotine or tobacco, such as cigarettes and e-cigarettes. If you need help quitting, ask your doctor. General instructions  Wash your hands after you touch a wart.  Do not scratch or pick at a wart.  Avoid shaving hair that is over a wart.  Keep all follow-up visits as told by your doctor. This is important. Contact a doctor if:  Your warts do not get better after treatment.  You have redness, swelling, or pain at the site of a wart.  You have bleeding from a wart, and the bleeding does not stop when you put light pressure on the wart.  You have diabetes and you get a wart. Summary  Warts are small growths on the skin. They are common, and they are caused by a virus.  Most of the time, warts do not need treatment. Sometimes people want warts removed. If treatment is needed or wanted, there are many options.  Apply over-the-counter and prescription medicines only as told by your doctor.  Wash  your hands after you touch a wart.  Keep all follow-up visits as told by your doctor. This is important. This information is not intended to replace advice given to you by your health care provider. Make sure you discuss any questions you have with your health care provider. Document Revised: 08/15/2017 Document Reviewed: 08/15/2017 Elsevier Patient Education  [place].

## 2020-02-03 NOTE — Progress Notes (Signed)
Acute Office Visit  Subjective:    Patient ID: Bridget Conway, female    DOB: 1955-09-09, 64 y.o.   MRN: [id number]  Chief Complaint  Patient presents with  . Freeze spot on face    HPI Patient is in today for freezing wart on right lateral face.  This is not a new problem for patient and has had warts frozen in the past.  Patient is not reporting bleeding, itching or pain from wart.  Past Medical History:  Diagnosis Date  . Allergy   . Anxiety   . Atypical ductal hyperplasia of right breast   . Cancer (HCC)    basal cell CA- below eye  . Depression   . GERD (gastroesophageal reflux disease)   . Hypertension    no meds, has white coat syndrome  . IFG (impaired fasting glucose) 2014  . Osteoporosis   . Wears glasses   . Wears partial dentures    upper partial    Past Surgical History:  Procedure Laterality Date  . ABDOMINAL HYSTERECTOMY    . BREAST LUMPECTOMY WITH RADIOACTIVE SEED LOCALIZATION Right 12/27/2019   Procedure: RIGHT BREAST LUMPECTOMY WITH RADIOACTIVE SEED LOCALIZATION;  Surgeon: Jovita Kussmaul, MD;  Location: [facility];  Service: General;  Laterality: Right;  . CARPAL TUNNEL RELEASE Left 07/11/2013   Procedure: LEFT CARPAL TUNNEL RELEASE;  Surgeon: Tennis Must, MD;  Location: [facility];  Service: Orthopedics;  Laterality: Left;  . carpel tunnel  2005   right wrist  . COLONOSCOPY    . hysterectomy  2002  . KNEE SURGERY Left   . LAPAROSCOPIC GASTRIC BANDING  2010    Family History  Problem Relation Age of Onset  . Breast cancer Mother 65  . Heart attack Father   . Colon cancer Neg Hx   . Esophageal cancer Neg Hx   . Rectal cancer Neg Hx   . Stomach cancer Neg Hx     Social History   Socioeconomic History  . Marital status: Divorced    Spouse name: Not on file  . Number of children: 1  . Years of education: Not on file  . Highest education level: Not on file  Occupational History  . Not on file  Tobacco  Use  . Smoking status: Former Smoker    Quit date: 07/09/2000    Years since quitting: 19.5  . Smokeless tobacco: Never Used  Vaping Use  . Vaping Use: Never used  Substance and Sexual Activity  . Alcohol use: No  . Drug use: No  . Sexual activity: Not on file  Other Topics Concern  . Not on file  Social History Narrative  . Not on file   Social Determinants of Health   Financial Resource Strain:   .   Food Insecurity:   .   Marland Kitchen   Transportation Needs:   .   Marland Kitchen   Physical Activity:   .   Marland Kitchen   Stress:   .   Social Connections:   .   .   .   .   .   .   Intimate Partner Violence:   .   .   .   .     Outpatient Medications Prior to Visit  Medication Sig Dispense Refill  . calcium carbonate (OSCAL) 1500 (600 Ca) MG TABS tablet Take by mouth.    . fish oil-omega-3 fatty acids 1000 MG capsule Take 1,200 mg by mouth  daily.    . Flaxseed, Linseed, (FLAXSEED OIL PO) Take 2,400 mg by mouth daily.    . fluticasone (FLONASE) 50 MCG/ACT nasal spray     . furosemide (LASIX) 20 MG tablet Take 1 tablet (20 mg total) by mouth daily. 90 tablet 1  . MELATONIN ER PO Take by mouth daily.    . Multiple Vitamins-Minerals (MULTIVITAMIN PO) Take by mouth daily.    . potassium chloride (KLOR-CON) 10 MEQ tablet Take 1 tablet (10 mEq total) by mouth 2 (two) times daily. 120 tablet 0  . Probiotic Product (PROBIOTIC DAILY PO) Take by mouth daily.    . sertraline (ZOLOFT) 100 MG tablet TAKE ONE (1) TABLET EACH DAY 90 tablet 1  . Turmeric 500 MG CAPS Take by mouth.    Marland Kitchen UNABLE TO FIND 2,000 mg by [facility] route 2 (two) times daily. Glucosamine     No facility-administered medications prior to visit.    Allergies  Allergen Reactions  . Latex   . Lisinopril   . Nsaids   . Statins   . Sulfa Antibiotics Hives    Review of Systems  Skin:       Facial wart  All other systems reviewed and are negative.      Objective:    Physical Exam Vitals reviewed.  Constitutional:       Appearance: Normal appearance.  HENT:     Head: Normocephalic.  Eyes:     Conjunctiva/sclera: Conjunctivae normal.  Cardiovascular:     Rate and Rhythm: Normal rate and regular rhythm.     Pulses: Normal pulses.     Heart sounds: Normal heart sounds.  Skin:    General: Skin is warm.     Findings: Lesion present.  Neurological:     Mental Status: She is alert and oriented to person, place, and time.     Comments: Wart right lateral face.  Psychiatric:        Mood and Affect: Mood normal.        Behavior: Behavior normal.     BP 128/76   Pulse 81   Temp (!) 96.7 F (35.9 C) (Temporal)   Ht 5\' 5"  (1.651 m)   Wt 277 lb 9.6 oz (125.9 kg)   SpO2 95%   BMI 46.20 kg/m  Wt Readings from Last 3 Encounters:  01/27/20 276 lb 6.4 oz (125.4 kg)  12/27/19 272 lb 11.3 oz (123.7 kg)  11/11/19 269 lb 4.8 oz (122.2 kg)    Health Maintenance Due  Topic Date Due  . HEMOGLOBIN A1C  06/19/2014  . FOOT EXAM  12/20/2014  . URINE MICROALBUMIN  12/20/2014      No results found for: TSH Lab Results  Component Value Date   WBC 5.6 01/28/2020   HGB 14.0 01/28/2020   HCT 40.7 01/28/2020   MCV 94 01/28/2020   PLT 253 01/28/2020   Lab Results  Component Value Date   NA 140 12/24/2019   K 4.2 12/24/2019   CO2 26 12/24/2019   GLUCOSE 97 12/24/2019   BUN 19 12/24/2019   CREATININE 0.63 12/24/2019   BILITOT 0.3 12/19/2013   ALKPHOS 68 12/19/2013   AST 18 12/19/2013   ALT 16 12/19/2013   PROT 7.4 12/19/2013   ALBUMIN 4.1 12/19/2013   CALCIUM 8.9 12/24/2019   ANIONGAP 10 12/24/2019   Lab Results  Component Value Date   CHOL 227 (H) 01/28/2020   Lab Results  Component Value Date   HDL 46 01/28/2020  Lab Results  Component Value Date   LDLCALC 158 (H) 01/28/2020   Lab Results  Component Value Date   TRIG 127 01/28/2020   Lab Results  Component Value Date   CHOLHDL 4.9 (H) 01/28/2020   Lab Results  Component Value Date   HGBA1C 5.5 12/19/2013       Assessment &  Plan:   Problem List Items Addressed This Visit      Musculoskeletal and Integument   Wart of face - Primary    Wart frozen using liquid nitrogen.  Patient tolerated procedure well.  We will continue to monitor.  Provided education with printed handouts given.  Follow-up with unresolved or worsening symptoms.         Follow up with worsening or unresolved symptoms   Ivy [name], NP

## 2020-02-29 DIAGNOSIS — J069 Acute upper respiratory infection, unspecified: Secondary | ICD-10-CM | POA: Diagnosis not present

## 2020-03-06 DIAGNOSIS — R059 Cough, unspecified: Secondary | ICD-10-CM | POA: Diagnosis not present

## 2020-03-07 DIAGNOSIS — Z20822 Contact with and (suspected) exposure to covid-19: Secondary | ICD-10-CM | POA: Diagnosis not present

## 2020-04-13 DIAGNOSIS — M1712 Unilateral primary osteoarthritis, left knee: Secondary | ICD-10-CM | POA: Diagnosis not present

## 2020-05-05 ENCOUNTER — Telehealth: Payer: Self-pay

## 2020-05-05 MED ORDER — LORAZEPAM 0.5 MG PO TABS: ORAL_TABLET | ORAL | 0 refills | Status: DC

## 2020-05-05 NOTE — Telephone Encounter (Signed)
Pt with upcoming MRI apt on 2/1 and has history of being claustrophobic.    Per MD - Ativan 0.5mg  - Take 1 hour prior to MRI - May repeat if needed.   Disp # 2.

## 2020-05-12 ENCOUNTER — Ambulatory Visit
Admission: RE | Admit: 2020-05-12 | Discharge: 2020-05-12 | Disposition: A | Discharge status: Home / Self Care | Payer: Medicare Other | Source: Ambulatory Visit | Attending: Hematology and Oncology | Admitting: Hematology and Oncology

## 2020-05-12 DIAGNOSIS — N6091 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: Secondary | ICD-10-CM

## 2020-05-12 DIAGNOSIS — N6325 Unspecified lump in the left breast, overlapping quadrants: Secondary | ICD-10-CM | POA: Diagnosis not present

## 2020-05-12 MED ORDER — GADOBUTROL 1 MMOL/ML IV SOLN
10.0000 mL | Freq: Once | INTRAVENOUS | Status: AC | PRN
  Administered 2020-05-12: 10 mL via INTRAVENOUS

## 2020-05-13 ENCOUNTER — Other Ambulatory Visit: Payer: Self-pay | Admitting: Hematology and Oncology

## 2020-05-13 DIAGNOSIS — R9389 Abnormal findings on diagnostic imaging of other specified body structures: Secondary | ICD-10-CM

## 2020-05-18 ENCOUNTER — Other Ambulatory Visit: Payer: Self-pay

## 2020-05-18 ENCOUNTER — Ambulatory Visit
Admission: RE | Admit: 2020-05-18 | Discharge: 2020-05-18 | Disposition: A | Discharge status: Home / Self Care | Payer: Medicare Other | Source: Ambulatory Visit | Attending: Hematology and Oncology | Admitting: Hematology and Oncology

## 2020-05-18 ENCOUNTER — Other Ambulatory Visit: Payer: Self-pay | Admitting: Diagnostic Radiology

## 2020-05-18 DIAGNOSIS — N6092 Unspecified benign mammary dysplasia of left breast: Secondary | ICD-10-CM | POA: Diagnosis not present

## 2020-05-18 DIAGNOSIS — R9389 Abnormal findings on diagnostic imaging of other specified body structures: Secondary | ICD-10-CM

## 2020-05-18 DIAGNOSIS — R921 Mammographic calcification found on diagnostic imaging of breast: Secondary | ICD-10-CM | POA: Diagnosis not present

## 2020-05-18 DIAGNOSIS — D242 Benign neoplasm of left breast: Secondary | ICD-10-CM | POA: Diagnosis not present

## 2020-05-18 MED ORDER — GADOBUTROL 1 MMOL/ML IV SOLN
10.0000 mL | Freq: Once | INTRAVENOUS | Status: AC | PRN
  Administered 2020-05-18: 10 mL via INTRAVENOUS

## 2020-05-20 ENCOUNTER — Other Ambulatory Visit: Payer: Self-pay | Admitting: Hematology and Oncology

## 2020-05-20 DIAGNOSIS — R9389 Abnormal findings on diagnostic imaging of other specified body structures: Secondary | ICD-10-CM

## 2020-05-22 ENCOUNTER — Ambulatory Visit
Admission: RE | Admit: 2020-05-22 | Discharge: 2020-05-22 | Disposition: A | Discharge status: Home / Self Care | Payer: Medicare Other | Source: Ambulatory Visit | Attending: Hematology and Oncology | Admitting: Hematology and Oncology

## 2020-05-22 ENCOUNTER — Other Ambulatory Visit: Payer: Self-pay

## 2020-05-22 ENCOUNTER — Other Ambulatory Visit (HOSPITAL_COMMUNITY): Payer: Self-pay | Admitting: Diagnostic Radiology

## 2020-05-22 DIAGNOSIS — R9389 Abnormal findings on diagnostic imaging of other specified body structures: Secondary | ICD-10-CM

## 2020-05-22 DIAGNOSIS — N6091 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: Secondary | ICD-10-CM | POA: Diagnosis not present

## 2020-05-22 DIAGNOSIS — N6081 Other benign mammary dysplasias of right breast: Secondary | ICD-10-CM | POA: Diagnosis not present

## 2020-05-22 MED ORDER — GADOBUTROL 1 MMOL/ML IV SOLN
10.0000 mL | Freq: Once | INTRAVENOUS | Status: AC | PRN
  Administered 2020-05-22: 10 mL via INTRAVENOUS

## 2020-05-25 ENCOUNTER — Telehealth: Payer: Self-pay | Admitting: Genetic Counselor

## 2020-05-25 NOTE — Telephone Encounter (Signed)
Bridget Conway cld to reschedule a genetic counseling appt on 2/22 at 2pm w/Emily.

## 2020-06-01 ENCOUNTER — Encounter: Payer: Self-pay | Admitting: Hematology and Oncology

## 2020-06-02 ENCOUNTER — Inpatient Hospital Stay: Payer: Medicare Other | Admitting: Genetic Counselor

## 2020-06-09 ENCOUNTER — Inpatient Hospital Stay: Payer: Medicare Other | Admitting: Genetic Counselor

## 2020-06-12 ENCOUNTER — Telehealth: Payer: Self-pay | Admitting: Genetic Counselor

## 2020-06-12 NOTE — Telephone Encounter (Signed)
Scheduled appointment per 03/04 schedule message. Contacted patient, patient is aware.

## 2020-06-19 ENCOUNTER — Ambulatory Visit: Payer: Self-pay | Admitting: General Surgery

## 2020-06-19 DIAGNOSIS — N6092 Unspecified benign mammary dysplasia of left breast: Secondary | ICD-10-CM | POA: Diagnosis not present

## 2020-06-19 DIAGNOSIS — N6091 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: Secondary | ICD-10-CM | POA: Diagnosis not present

## 2020-06-23 ENCOUNTER — Other Ambulatory Visit: Payer: Self-pay

## 2020-06-23 ENCOUNTER — Other Ambulatory Visit: Payer: Self-pay | Admitting: Genetic Counselor

## 2020-06-23 ENCOUNTER — Inpatient Hospital Stay: Payer: Medicare Other | Attending: Hematology and Oncology | Admitting: Genetic Counselor

## 2020-06-23 ENCOUNTER — Inpatient Hospital Stay: Payer: Medicare Other

## 2020-06-23 DIAGNOSIS — Z800 Family history of malignant neoplasm of digestive organs: Secondary | ICD-10-CM | POA: Diagnosis not present

## 2020-06-23 DIAGNOSIS — Z85828 Personal history of other malignant neoplasm of skin: Secondary | ICD-10-CM

## 2020-06-23 DIAGNOSIS — Z801 Family history of malignant neoplasm of trachea, bronchus and lung: Secondary | ICD-10-CM | POA: Diagnosis not present

## 2020-06-23 DIAGNOSIS — Z803 Family history of malignant neoplasm of breast: Secondary | ICD-10-CM

## 2020-06-23 DIAGNOSIS — Z8049 Family history of malignant neoplasm of other genital organs: Secondary | ICD-10-CM | POA: Diagnosis not present

## 2020-06-23 DIAGNOSIS — Z808 Family history of malignant neoplasm of other organs or systems: Secondary | ICD-10-CM

## 2020-06-23 LAB — GENETIC SCREENING ORDER

## 2020-06-24 ENCOUNTER — Encounter: Payer: Self-pay | Admitting: Genetic Counselor

## 2020-06-24 DIAGNOSIS — Z801 Family history of malignant neoplasm of trachea, bronchus and lung: Secondary | ICD-10-CM | POA: Insufficient documentation

## 2020-06-24 DIAGNOSIS — Z85828 Personal history of other malignant neoplasm of skin: Secondary | ICD-10-CM | POA: Insufficient documentation

## 2020-06-24 DIAGNOSIS — Z8049 Family history of malignant neoplasm of other genital organs: Secondary | ICD-10-CM | POA: Insufficient documentation

## 2020-06-24 DIAGNOSIS — Z803 Family history of malignant neoplasm of breast: Secondary | ICD-10-CM | POA: Insufficient documentation

## 2020-06-24 DIAGNOSIS — Z800 Family history of malignant neoplasm of digestive organs: Secondary | ICD-10-CM | POA: Insufficient documentation

## 2020-06-24 DIAGNOSIS — Z808 Family history of malignant neoplasm of other organs or systems: Secondary | ICD-10-CM | POA: Insufficient documentation

## 2020-06-24 NOTE — Progress Notes (Signed)
REFERRING PROVIDER: Nicholas Lose, MD [place],  [place] [id number]  PRIMARY PROVIDER:  Ivy [name], NP  PRIMARY REASON FOR VISIT:  1. Family history of breast cancer   2. Family history of cervical cancer   3. Family history of uterine cancer   4. Family history of melanoma   5. Family history of colon cancer   6. Family history of pancreatic cancer   7. Family history of brain cancer   8. Family history of lung cancer   9. History of nonmelanoma skin cancer      HISTORY OF PRESENT ILLNESS:   Ms. Roca, a 65 y.o. female, was seen for a [facility] cancer genetics consultation at the request of Dr. Lindi Adie due to a family history of cancer.  Ms. Bowlds presents to clinic today to discuss the possibility of a hereditary predisposition to cancer, genetic testing, and to further clarify her future cancer risks, as well as potential cancer risks for family members.   Ms. 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  Of note, Ms. Kowalewski has had four breast biopsies and a right lumpectomy in the past year, which revealed atypical ductal hyperplasia. She plans to have a bilateral mastectomy on 07/17/20 with Dr. Marlou Starks.   RISK FACTORS:  Menarche was at age 44.  First live birth at age 25.  OCP use: unknown. Ovaries intact: no.  Hysterectomy: yes.  Menopausal status: postmenopausal.  HRT use: 1 year (20 years ago). Colonoscopy: yes; history of one tubular adenoma. Mammogram within the last year: yes. Number of breast biopsies: 4 (atypical ductal hyperplasia and ductal papilloma).   Past Medical History:  Diagnosis Date  . Allergy   . Anxiety   . Atypical ductal hyperplasia of right breast   . Cancer (HCC)    basal cell CA- below eye  . Depression   . Family history of brain cancer   . Family history of breast cancer   . Family history of cervical  cancer   . Family history of colon cancer   . Family history of lung cancer   . Family history of melanoma   . Family history of pancreatic cancer   . Family history of uterine cancer   . GERD (gastroesophageal reflux disease)   . History of nonmelanoma skin cancer   . Hypertension    no meds, has white coat syndrome  . IFG (impaired fasting glucose) 2014  . Osteoporosis   . Wears glasses   . Wears partial dentures    upper partial    Past Surgical History:  Procedure Laterality Date  . ABDOMINAL HYSTERECTOMY    . BREAST LUMPECTOMY WITH RADIOACTIVE SEED LOCALIZATION Right 12/27/2019   Procedure: RIGHT BREAST LUMPECTOMY WITH RADIOACTIVE SEED LOCALIZATION;  Surgeon: Jovita Kussmaul, MD;  Location: [facility];  Service: General;  Laterality: Right;  . CARPAL TUNNEL RELEASE Left 07/11/2013   Procedure: LEFT CARPAL TUNNEL RELEASE;  Surgeon: Tennis Must, MD;  Location: [facility];  Service: Orthopedics;  Laterality: Left;  . carpel tunnel  2005   right wrist  . COLONOSCOPY    . hysterectomy  2002  . KNEE SURGERY Left   . LAPAROSCOPIC GASTRIC BANDING  2010    Social History   Socioeconomic History  . Marital status: Divorced    Spouse name: Not on file  .  Number of children: 1  . Years of education: Not on file  . Highest education level: Not on file  Occupational History  . Not on file  Tobacco Use  . Smoking status: Former Smoker    Quit date: 07/09/2000    Years since quitting: 19.9  . Smokeless tobacco: Never Used  Vaping Use  . Vaping Use: Never used  Substance and Sexual Activity  . Alcohol use: No  . Drug use: No  . Sexual activity: Not on file  Other Topics Concern  . Not on file  Social History Narrative  . Not on file   Social Determinants of Health   Financial Resource Strain: Not on file  Food Insecurity: Not on file  Transportation Needs: Not on file  Physical Activity: Not on file  Stress: Not on file  Social  Connections: Not on file     FAMILY HISTORY:  We obtained a detailed, 4-generation family history.  Significant diagnoses are listed below: Family History  Problem Relation Age of Onset  . Breast cancer Mother 52  . Heart attack Father   . Cervical cancer Sister 35  . Uterine cancer Maternal Aunt 80  . Melanoma Cousin 67       head (maternal first cousin)  . Lung cancer Cousin 73       (paternal first cousin)  . Colon cancer Cousin 4       (paternal first cousin)  . Lung cancer Cousin 11       (paternal first cousin)  . Colon cancer Cousin 38       (paternal first cousin)  . Brain cancer Cousin 65       (paternal first cousin)  . Lung cancer Cousin 68       (paternal first cousin)  . Pancreatic cancer Cousin 48       (paternal first cousin)  . Esophageal cancer Neg Hx   . Rectal cancer Neg Hx   . Stomach cancer Neg Hx    Ms. Gilliam has one daughter (age 43). She has one brother (deceased age 26) and two sisters (one alive with a history of cervical cancer at 86, one deceased at age 14). She also has a paternal half-sister (age 34).  Ms. Schwarz mother died at age 27 from breast cancer (diagnosed age 30). There were three maternal aunts. One aunt was diagnosed with uterine cancer at age 26. One cousin was diagnosed with melanoma on his head/face at age 20. Ms. Ganus maternal grandmother died at age 30 without cancer. Her maternal grandfather died at age 21 without cancer.  Ms. Seger father died at age 20 without cancer. There were seven paternal aunts and one paternal uncle. There is no known cancer among paternal aunts/uncles. Ms. Fluty paternal grandmother died at age 70 without cancer. Her paternal grandfather died at age 71 without cancer. There were seven paternal first cousins who have had cancer - one with lung cancer (diagnosed age 2), a second with colon cancer (diagnosed age 47), a third with lung cancer (diagnosed age 46), a 37 with colon cancer (diagnosed  age 85), a fifth with brain cancer (diagnosed age 39), a sixth with lung cancer (diagnosed age 94), and the seventh with pancreatic cancer (diagnosed age 14).  Ms. Brun is unaware of previous family history of genetic testing for hereditary cancer risks. Patient's ancestors are of [place], [place], and [place] descent. There is no reported Ashkenazi Jewish ancestry. There is no known consanguinity.  GENETIC COUNSELING ASSESSMENT:  Ms. Crispen is a 65 y.o. female with a family history of young-onset breast cancer, uterine cancer, melanoma, colon cancer, pancreatic cancer, and brain cancer, which is somewhat suggestive of a hereditary cancer syndrome and predisposition to cancer. We, therefore, discussed and recommended the following at today's visit.   DISCUSSION: We discussed that approximately 5-10% of breast cancer is hereditary, with most cases associated with the BRCA1 and BRCA2 genes. There are other genes that can be associated with hereditary breast cancer syndromes. These include ATM, CHEK2, PALB2, etc. We discussed that testing is beneficial for several reasons, including knowing about other cancer risks, identifying potential screening and risk-reduction options that may be appropriate, and to understand if other family members could be at risk for cancer and allow them to undergo genetic testing.   We reviewed the characteristics, features and inheritance patterns of hereditary cancer syndromes. We also discussed genetic testing, including the appropriate family members to test, the process of testing, insurance coverage and turn-around-time for results. We discussed the implications of a negative, positive and/or variant of uncertain significant result. We recommended Ms. Schmuck pursue genetic testing for the [facility] Common Hereditary Cancers + RNA panel.   The Common Hereditary Cancers Panel offered by [facility] includes sequencing and/or deletion duplication testing of the following 47genes:  APC*, ATM*, AXIN2*, BARD1*, BMPR1A*, BRCA1*, BRCA2*, BRIP1*, CDH1*, CDK4, CDKN2A (p14ARF), CDKN2A (p16INK4a), CHEK2*, CTNNA1*, DICER1*, EPCAM (Deletion/duplication testing only), GREM1 (promoter region deletion/duplication testing only), KIT, MEN1*, MLH1*, MSH2*, MSH3*, MSH6*, MUTYH*, NBN*, NF1*, NTHL1, PALB2*, PDGFRA, PMS2*, POLD1*, POLE*, PTEN*, RAD50*, RAD51C*, RAD51D*, SDHB*, SDHC*, SDHD*, SMAD4*, SMARCA4*, STK11*, TP53*, TSC1*, TSC2*, and VHL*.  The following genes were evaluated for sequence changes only: SDHA* and HOXB13 c.251G>A variant only. RNA analysis performed for * genes.   Based on Ms. Newsham's family history of cancer, she meets medical criteria for genetic testing. Despite that she meets criteria, there may still be an out of pocket cost. We discussed that if her out of pocket cost for testing is over $100, the laboratory will reach out to let her know. If the out of pocket cost of testing is less than $100 she will be billed by the genetic testing laboratory.   PLAN: After considering the risks, benefits, and limitations, Ms. Veldman provided informed consent to pursue genetic testing and the blood sample was sent to [facility] for analysis of the Common Hereditary Cancers + RNA panel. Results should be available within approximately two-three weeks' time, at which point they will be disclosed by telephone to Ms. Carbin, as will any additional recommendations warranted by these results. Ms. Buckel will receive a summary of her genetic counseling visit and a copy of her results once available. This information will also be available in Epic.   Ms. Sabas questions were answered to her satisfaction today. Our contact information was provided should additional questions or concerns arise. Thank you for the referral and allowing [place] to share in the care of your patient.   Clint Guy, [place], [facility] Licensed, Certified Dispensing [email or web address] Phone:  [phone]  The patient was seen for a total of 35 minutes in face-to-face genetic counseling.  This patient was discussed with Drs. Magrinat, Lindi Adie and/or Burr Medico who agrees with the above.    _______________________________________________________________________ For Office Staff:  Number of people involved in session: 2 Was an Intern/ student involved with case: yes

## 2020-07-06 ENCOUNTER — Other Ambulatory Visit: Payer: Self-pay

## 2020-07-06 ENCOUNTER — Encounter (HOSPITAL_BASED_OUTPATIENT_CLINIC_OR_DEPARTMENT_OTHER): Payer: Self-pay | Admitting: General Surgery

## 2020-07-10 ENCOUNTER — Encounter (HOSPITAL_BASED_OUTPATIENT_CLINIC_OR_DEPARTMENT_OTHER)
Admission: RE | Admit: 2020-07-10 | Discharge: 2020-07-10 | Disposition: A | Discharge status: Home / Self Care | Payer: Medicare Other | Source: Ambulatory Visit | Attending: General Surgery | Admitting: General Surgery

## 2020-07-10 DIAGNOSIS — Z01818 Encounter for other preprocedural examination: Secondary | ICD-10-CM | POA: Diagnosis not present

## 2020-07-10 LAB — BASIC METABOLIC PANEL
Anion gap: 6 (ref 5–15)
BUN: 19 mg/dL (ref 8–23)
CO2: 30 mmol/L (ref 22–32)
Calcium: 8.9 mg/dL (ref 8.9–10.3)
Chloride: 103 mmol/L (ref 98–111)
Creatinine, Ser: 0.65 mg/dL (ref 0.44–1.00)
GFR, Estimated: >60 mL/min (ref >60)
Glucose, Bld: 108 mg/dL — ABNORMAL HIGH (ref 70–99)
Potassium: 4 mmol/L (ref 3.5–5.1)
Sodium: 139 mmol/L (ref 135–145)

## 2020-07-10 NOTE — Progress Notes (Signed)

## 2020-07-14 ENCOUNTER — Other Ambulatory Visit (HOSPITAL_COMMUNITY)
Admission: RE | Admit: 2020-07-14 | Discharge: 2020-07-14 | Disposition: A | Discharge status: Home / Self Care | Payer: Medicare Other | Source: Ambulatory Visit | Attending: General Surgery | Admitting: General Surgery

## 2020-07-14 DIAGNOSIS — Z20822 Contact with and (suspected) exposure to covid-19: Secondary | ICD-10-CM | POA: Diagnosis not present

## 2020-07-14 DIAGNOSIS — Z01812 Encounter for preprocedural laboratory examination: Secondary | ICD-10-CM | POA: Insufficient documentation

## 2020-07-14 DIAGNOSIS — Z1379 Encounter for other screening for genetic and chromosomal anomalies: Secondary | ICD-10-CM | POA: Insufficient documentation

## 2020-07-14 LAB — SARS CORONAVIRUS 2 (TAT 6-24 HRS): SARS Coronavirus 2: NEGATIVE

## 2020-07-17 ENCOUNTER — Encounter (HOSPITAL_BASED_OUTPATIENT_CLINIC_OR_DEPARTMENT_OTHER): Payer: Self-pay | Admitting: General Surgery

## 2020-07-17 ENCOUNTER — Ambulatory Visit (HOSPITAL_BASED_OUTPATIENT_CLINIC_OR_DEPARTMENT_OTHER): Payer: Medicare Other | Admitting: Certified Registered"

## 2020-07-17 ENCOUNTER — Other Ambulatory Visit: Payer: Self-pay

## 2020-07-17 ENCOUNTER — Ambulatory Visit (HOSPITAL_BASED_OUTPATIENT_CLINIC_OR_DEPARTMENT_OTHER)
Admission: RE | Admit: 2020-07-17 | Discharge: 2020-07-17 | Disposition: A | Discharge status: Home / Self Care | Payer: Medicare Other | Source: Ambulatory Visit | Attending: General Surgery | Admitting: General Surgery

## 2020-07-17 ENCOUNTER — Encounter (HOSPITAL_BASED_OUTPATIENT_CLINIC_OR_DEPARTMENT_OTHER)
Admission: RE | Disposition: A | Discharge status: Home / Self Care | Payer: Self-pay | Source: Ambulatory Visit | Attending: General Surgery

## 2020-07-17 DIAGNOSIS — Z886 Allergy status to analgesic agent status: Secondary | ICD-10-CM | POA: Insufficient documentation

## 2020-07-17 DIAGNOSIS — G8918 Other acute postprocedural pain: Secondary | ICD-10-CM | POA: Diagnosis not present

## 2020-07-17 DIAGNOSIS — Z882 Allergy status to sulfonamides status: Secondary | ICD-10-CM | POA: Diagnosis not present

## 2020-07-17 DIAGNOSIS — N6489 Other specified disorders of breast: Secondary | ICD-10-CM | POA: Insufficient documentation

## 2020-07-17 DIAGNOSIS — Z888 Allergy status to other drugs, medicaments and biological substances status: Secondary | ICD-10-CM | POA: Insufficient documentation

## 2020-07-17 DIAGNOSIS — N6081 Other benign mammary dysplasias of right breast: Secondary | ICD-10-CM | POA: Diagnosis not present

## 2020-07-17 DIAGNOSIS — N6091 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: Secondary | ICD-10-CM | POA: Insufficient documentation

## 2020-07-17 DIAGNOSIS — N6092 Unspecified benign mammary dysplasia of left breast: Secondary | ICD-10-CM | POA: Insufficient documentation

## 2020-07-17 DIAGNOSIS — N6082 Other benign mammary dysplasias of left breast: Secondary | ICD-10-CM | POA: Diagnosis not present

## 2020-07-17 DIAGNOSIS — E876 Hypokalemia: Secondary | ICD-10-CM | POA: Diagnosis not present

## 2020-07-17 HISTORY — DX: 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: N60.91

## 2020-07-17 HISTORY — PX: TOTAL MASTECTOMY: SHX6129

## 2020-07-17 SURGERY — MASTECTOMY, SIMPLE
Anesthesia: General | Site: Breast | Laterality: Bilateral

## 2020-07-17 MED ORDER — EPHEDRINE 5 MG/ML INJ
INTRAVENOUS | Status: AC
  Filled 2020-07-17: qty 10

## 2020-07-17 MED ORDER — METHOCARBAMOL 500 MG PO TABS
500.0000 mg | ORAL_TABLET | Freq: Three times a day (TID) | ORAL | Status: DC
  Administered 2020-07-17: 500 mg via ORAL
  Filled 2020-07-17: qty 1

## 2020-07-17 MED ORDER — SUGAMMADEX SODIUM 500 MG/5ML IV SOLN
INTRAVENOUS | Status: DC | PRN
  Administered 2020-07-17: 251.2 mg via INTRAVENOUS

## 2020-07-17 MED ORDER — LIDOCAINE HCL (CARDIAC) PF 100 MG/5ML IV SOSY
PREFILLED_SYRINGE | INTRAVENOUS | Status: DC | PRN
  Administered 2020-07-17: 60 mg via INTRAVENOUS

## 2020-07-17 MED ORDER — BUPIVACAINE-EPINEPHRINE (PF) 0.5% -1:200000 IJ SOLN
INTRAMUSCULAR | Status: DC | PRN
  Administered 2020-07-17 (×2): 15 mL via PERINEURAL

## 2020-07-17 MED ORDER — FLUTICASONE PROPIONATE 50 MCG/ACT NA SUSP
2.0000 | Freq: Every day | NASAL | Status: DC
  Filled 2020-07-17: qty 16

## 2020-07-17 MED ORDER — DEXAMETHASONE SODIUM PHOSPHATE 4 MG/ML IJ SOLN
INTRAMUSCULAR | Status: DC | PRN
  Administered 2020-07-17: 10 mg via INTRAVENOUS

## 2020-07-17 MED ORDER — CEFAZOLIN SODIUM-DEXTROSE 1-4 GM/50ML-% IV SOLN
INTRAVENOUS | Status: AC
  Filled 2020-07-17: qty 50

## 2020-07-17 MED ORDER — ACETAMINOPHEN 500 MG PO TABS
ORAL_TABLET | ORAL | Status: AC
  Filled 2020-07-17: qty 2

## 2020-07-17 MED ORDER — PROPOFOL 500 MG/50ML IV EMUL
INTRAVENOUS | Status: AC
  Filled 2020-07-17: qty 50

## 2020-07-17 MED ORDER — CEFAZOLIN SODIUM-DEXTROSE 2-4 GM/100ML-% IV SOLN
2.0000 g | INTRAVENOUS | Status: AC
  Administered 2020-07-17: 3 g via INTRAVENOUS

## 2020-07-17 MED ORDER — HYDROMORPHONE HCL 1 MG/ML IJ SOLN
INTRAMUSCULAR | Status: AC
  Filled 2020-07-17: qty 0.5

## 2020-07-17 MED ORDER — FENTANYL CITRATE (PF) 100 MCG/2ML IJ SOLN
INTRAMUSCULAR | Status: AC
  Filled 2020-07-17: qty 2

## 2020-07-17 MED ORDER — CHLORHEXIDINE GLUCONATE CLOTH 2 % EX PADS: 6.0000 | MEDICATED_PAD | Freq: Once | CUTANEOUS | Status: DC

## 2020-07-17 MED ORDER — MIDAZOLAM HCL 2 MG/2ML IJ SOLN
INTRAMUSCULAR | Status: AC
  Filled 2020-07-17: qty 2

## 2020-07-17 MED ORDER — BUPIVACAINE-EPINEPHRINE (PF) 0.5% -1:200000 IJ SOLN: INTRAMUSCULAR | Status: DC | PRN

## 2020-07-17 MED ORDER — HYDROCODONE-ACETAMINOPHEN 5-325 MG PO TABS: 1.0000 | ORAL_TABLET | ORAL | Status: DC | PRN

## 2020-07-17 MED ORDER — FUROSEMIDE 20 MG PO TABS
20.0000 mg | ORAL_TABLET | Freq: Every day | ORAL | Status: DC
  Filled 2020-07-17: qty 1

## 2020-07-17 MED ORDER — HEPARIN SODIUM (PORCINE) 5000 UNIT/ML IJ SOLN: 5000.0000 [IU] | Freq: Three times a day (TID) | INTRAMUSCULAR | Status: DC

## 2020-07-17 MED ORDER — MORPHINE SULFATE (PF) 4 MG/ML IV SOLN: 1.0000 mg | INTRAVENOUS | Status: DC | PRN

## 2020-07-17 MED ORDER — GABAPENTIN 300 MG PO CAPS
ORAL_CAPSULE | ORAL | Status: AC
  Filled 2020-07-17: qty 1

## 2020-07-17 MED ORDER — ONDANSETRON HCL 4 MG/2ML IJ SOLN: 4.0000 mg | Freq: Four times a day (QID) | INTRAMUSCULAR | Status: DC | PRN

## 2020-07-17 MED ORDER — PROPOFOL 10 MG/ML IV BOLUS
INTRAVENOUS | Status: DC | PRN
  Administered 2020-07-17: 160 mg via INTRAVENOUS
  Administered 2020-07-17: 25 ug/kg/min via INTRAVENOUS

## 2020-07-17 MED ORDER — SERTRALINE HCL 25 MG PO TABS
25.0000 mg | ORAL_TABLET | Freq: Every day | ORAL | Status: DC
  Filled 2020-07-17: qty 1

## 2020-07-17 MED ORDER — PHENYLEPHRINE 40 MCG/ML (10ML) SYRINGE FOR IV PUSH (FOR BLOOD PRESSURE SUPPORT)
PREFILLED_SYRINGE | INTRAVENOUS | Status: AC
  Filled 2020-07-17: qty 10

## 2020-07-17 MED ORDER — GABAPENTIN 300 MG PO CAPS
300.0000 mg | ORAL_CAPSULE | ORAL | Status: AC
  Administered 2020-07-17: 300 mg via ORAL

## 2020-07-17 MED ORDER — EPHEDRINE SULFATE 50 MG/ML IJ SOLN
INTRAMUSCULAR | Status: DC | PRN
  Administered 2020-07-17 (×3): 10 mg via INTRAVENOUS

## 2020-07-17 MED ORDER — PROMETHAZINE HCL 25 MG/ML IJ SOLN: 6.2500 mg | INTRAMUSCULAR | Status: DC | PRN

## 2020-07-17 MED ORDER — PANTOPRAZOLE SODIUM 40 MG IV SOLR: 40.0000 mg | Freq: Every day | INTRAVENOUS | Status: DC

## 2020-07-17 MED ORDER — HYDROMORPHONE HCL 1 MG/ML IJ SOLN
0.5000 mg | Freq: Once | INTRAMUSCULAR | Status: AC
  Administered 2020-07-17: 0.5 mg via INTRAVENOUS

## 2020-07-17 MED ORDER — FENTANYL CITRATE (PF) 100 MCG/2ML IJ SOLN
INTRAMUSCULAR | Status: DC | PRN
  Administered 2020-07-17 (×2): 50 ug via INTRAVENOUS

## 2020-07-17 MED ORDER — HYDROCODONE-ACETAMINOPHEN 5-325 MG PO TABS
0.5000 | ORAL_TABLET | ORAL | Status: DC | PRN
  Administered 2020-07-17: 1 via ORAL
  Filled 2020-07-17: qty 1

## 2020-07-17 MED ORDER — DIPHENHYDRAMINE HCL 25 MG PO CAPS
25.0000 mg | ORAL_CAPSULE | Freq: Four times a day (QID) | ORAL | Status: DC | PRN
  Administered 2020-07-17: 25 mg via ORAL
  Filled 2020-07-17 (×2): qty 1

## 2020-07-17 MED ORDER — PROPOFOL 10 MG/ML IV BOLUS
INTRAVENOUS | Status: AC
  Filled 2020-07-17: qty 20

## 2020-07-17 MED ORDER — LACTATED RINGERS IV SOLN: INTRAVENOUS | Status: DC

## 2020-07-17 MED ORDER — DEXAMETHASONE SODIUM PHOSPHATE 10 MG/ML IJ SOLN
INTRAMUSCULAR | Status: AC
  Filled 2020-07-17: qty 1

## 2020-07-17 MED ORDER — ONDANSETRON HCL 4 MG/2ML IJ SOLN
INTRAMUSCULAR | Status: AC
  Filled 2020-07-17: qty 2

## 2020-07-17 MED ORDER — SODIUM CHLORIDE 0.9 % IV SOLN: INTRAVENOUS | Status: DC

## 2020-07-17 MED ORDER — HYDROCODONE-ACETAMINOPHEN 5-325 MG PO TABS: 1.0000 | ORAL_TABLET | Freq: Four times a day (QID) | ORAL | 0 refills | Status: DC | PRN

## 2020-07-17 MED ORDER — MIDAZOLAM HCL 2 MG/2ML IJ SOLN
2.0000 mg | Freq: Once | INTRAMUSCULAR | Status: AC
  Administered 2020-07-17: 2 mg via INTRAVENOUS

## 2020-07-17 MED ORDER — FENTANYL CITRATE (PF) 100 MCG/2ML IJ SOLN
25.0000 ug | INTRAMUSCULAR | Status: DC | PRN
  Administered 2020-07-17 (×2): 50 ug via INTRAVENOUS

## 2020-07-17 MED ORDER — CEFAZOLIN SODIUM-DEXTROSE 2-4 GM/100ML-% IV SOLN
INTRAVENOUS | Status: AC
  Filled 2020-07-17: qty 100

## 2020-07-17 MED ORDER — FENTANYL CITRATE (PF) 100 MCG/2ML IJ SOLN
100.0000 ug | Freq: Once | INTRAMUSCULAR | Status: AC
  Administered 2020-07-17: 50 ug via INTRAVENOUS

## 2020-07-17 MED ORDER — PHENYLEPHRINE HCL (PRESSORS) 10 MG/ML IV SOLN
INTRAVENOUS | Status: DC | PRN
  Administered 2020-07-17 (×5): 80 ug via INTRAVENOUS

## 2020-07-17 MED ORDER — ACETAMINOPHEN 500 MG PO TABS
1000.0000 mg | ORAL_TABLET | ORAL | Status: AC
  Administered 2020-07-17: 1000 mg via ORAL

## 2020-07-17 MED ORDER — ONDANSETRON HCL 4 MG/2ML IJ SOLN
INTRAMUSCULAR | Status: DC | PRN
  Administered 2020-07-17: 4 mg via INTRAVENOUS

## 2020-07-17 MED ORDER — ONDANSETRON 4 MG PO TBDP: 4.0000 mg | ORAL_TABLET | Freq: Four times a day (QID) | ORAL | Status: DC | PRN

## 2020-07-17 MED ORDER — ROCURONIUM BROMIDE 100 MG/10ML IV SOLN
INTRAVENOUS | Status: DC | PRN
  Administered 2020-07-17: 20 mg via INTRAVENOUS
  Administered 2020-07-17: 80 mg via INTRAVENOUS

## 2020-07-17 SURGICAL SUPPLY — 51 items
ADH SKN CLS APL DERMABOND .7 (GAUZE/BANDAGES/DRESSINGS) ×2
APL PRP STRL LF DISP 70% ISPRP (MISCELLANEOUS) ×1
APPLIER CLIP 9.375 MED OPEN (MISCELLANEOUS) ×6
APR CLP MED 9.3 20 MLT OPN (MISCELLANEOUS) ×2
BINDER BREAST 3XL (GAUZE/BANDAGES/DRESSINGS) ×2 IMPLANT
BINDER BREAST XXLRG (GAUZE/BANDAGES/DRESSINGS) IMPLANT
BIOPATCH RED 1 DISK 7.0 (GAUZE/BANDAGES/DRESSINGS) ×3 IMPLANT
BIOPATCH RED 1IN DISK 7.0MM (GAUZE/BANDAGES/DRESSINGS) ×2
BLADE SURG 10 STRL SS (BLADE) ×5 IMPLANT
BLADE SURG 15 STRL LF DISP TIS (BLADE) ×1 IMPLANT
BLADE SURG 15 STRL SS (BLADE) ×3
CANISTER SUCT 1200ML W/VALVE (MISCELLANEOUS) ×3 IMPLANT
CHLORAPREP W/TINT 26 (MISCELLANEOUS) ×3 IMPLANT
CLIP APPLIE 9.375 MED OPEN (MISCELLANEOUS) ×1 IMPLANT
COVER BACK TABLE 60X90IN (DRAPES) ×3 IMPLANT
COVER MAYO STAND STRL (DRAPES) ×3 IMPLANT
DERMABOND ADVANCED (GAUZE/BANDAGES/DRESSINGS) ×4
DERMABOND ADVANCED .7 DNX12 (GAUZE/BANDAGES/DRESSINGS) ×1 IMPLANT
DEVICE DSSCT PLSMBLD 3.0S LGHT (MISCELLANEOUS) ×1 IMPLANT
DRAIN CHANNEL 19F RND (DRAIN) ×5 IMPLANT
DRAPE LAPAROSCOPIC ABDOMINAL (DRAPES) ×3 IMPLANT
DRAPE UTILITY XL STRL (DRAPES) ×3 IMPLANT
DRSG PAD ABDOMINAL 8X10 ST (GAUZE/BANDAGES/DRESSINGS) ×4 IMPLANT
DRSG TEGADERM 2-3/8X2-3/4 SM (GAUZE/BANDAGES/DRESSINGS) ×4 IMPLANT
ELECT COATED BLADE 2.86 ST (ELECTRODE) ×3 IMPLANT
ELECT REM PT RETURN 9FT ADLT (ELECTROSURGICAL) ×3
ELECTRODE REM PT RTRN 9FT ADLT (ELECTROSURGICAL) ×1 IMPLANT
EVACUATOR SILICONE 100CC (DRAIN) ×5 IMPLANT
GAUZE SPONGE 4X4 12PLY STRL LF (GAUZE/BANDAGES/DRESSINGS) IMPLANT
GLOVE SRG 8 PF TXTR STRL LF DI (GLOVE) IMPLANT
GLOVE SURG POLYISO LF SZ6.5 (GLOVE) ×4 IMPLANT
GLOVE SURG POLYISO LF SZ7.5 (GLOVE) ×4 IMPLANT
GLOVE SURG UNDER POLY LF SZ7 (GLOVE) ×6 IMPLANT
GLOVE SURG UNDER POLY LF SZ8 (GLOVE) ×3
GOWN STRL REUS W/ TWL LRG LVL3 (GOWN DISPOSABLE) ×2 IMPLANT
GOWN STRL REUS W/TWL LRG LVL3 (GOWN DISPOSABLE) ×12
NS IRRIG 1000ML POUR BTL (IV SOLUTION) ×3 IMPLANT
PACK BASIN DAY SURGERY FS (CUSTOM PROCEDURE TRAY) ×3 IMPLANT
PENCIL SMOKE EVACUATOR (MISCELLANEOUS) ×3 IMPLANT
PIN SAFETY STERILE (MISCELLANEOUS) ×3 IMPLANT
PLASMABLADE 3.0S W/LIGHT (MISCELLANEOUS) ×3
SLEEVE SCD COMPRESS KNEE MED (STOCKING) ×3 IMPLANT
SPONGE LAP 18X18 RF (DISPOSABLE) ×5 IMPLANT
SUT ETHILON 2 0 FS 18 (SUTURE) ×5 IMPLANT
SUT MNCRL AB 4-0 PS2 18 (SUTURE) ×3 IMPLANT
SUT SILK 2 0 SH (SUTURE) ×2 IMPLANT
SUT VICRYL 3-0 CR8 SH (SUTURE) ×3 IMPLANT
TOWEL GREEN STERILE FF (TOWEL DISPOSABLE) ×3 IMPLANT
TUBE CONNECTING 20'X1/4 (TUBING) ×1
TUBE CONNECTING 20X1/4 (TUBING) ×2 IMPLANT
YANKAUER SUCT BULB TIP NO VENT (SUCTIONS) ×3 IMPLANT

## 2020-07-17 NOTE — Anesthesia Procedure Notes (Addendum)
Procedure Name: Intubation Performed by: Pauline Pegues S, CRNA Pre-anesthesia Checklist: Patient identified, Emergency Drugs available, Suction available and Patient being monitored Patient Re-evaluated:Patient Re-evaluated prior to induction Oxygen Delivery Method: Circle System Utilized Preoxygenation: Pre-oxygenation with 100% oxygen Induction Type: IV induction Ventilation: Mask ventilation without difficulty Laryngoscope Size: Mac and 3 Grade View: Grade I Tube type: Oral Tube size: 7.0 mm Number of attempts: 1 Airway Equipment and Method: Stylet and Oral airway Placement Confirmation: ETT inserted through vocal cords under direct vision,  positive ETCO2 and breath sounds checked- equal and bilateral Secured at: 21 cm Tube secured with: Tape Dental Injury: Teeth and Oropharynx as per pre-operative assessment        

## 2020-07-17 NOTE — Anesthesia Procedure Notes (Addendum)
Anesthesia Regional Block: Pectoralis block   Pre-Anesthetic Checklist: ,, timeout performed, Correct Patient, Correct Site, Correct Laterality, Correct Procedure, Correct Position, site marked, Risks and benefits discussed,  Surgical consent,  Pre-op evaluation,  At surgeon's request and post-op pain management  Laterality: Right  Prep: chloraprep       Needles:  Injection technique: Single-shot  Needle Type: Echogenic Needle     Needle Length: 9cm  Needle Gauge: 21     Additional Needles:   Procedures:,,,, ultrasound used (permanent image in chart),,,,  Narrative:  Start time: 07/17/2020 8:05 AM End time: 07/17/2020 8:10 AM Injection made incrementally with aspirations every 5 mL.  Performed by: Personally  Anesthesiologist: Catalina Gravel, MD  Additional Notes: No pain on injection. No increased resistance to injection. Injection made in 5cc increments.  Good needle visualization.  Patient tolerated procedure well.  15cc of 0.5% Bupiv with epi mixed with 15cc NS for total of 30cc solution.

## 2020-07-17 NOTE — H&P (Signed)
[name]  Location: [facility] Patient #: [id number] DOB: 1955/06/25 Divorced / Language: Cleophus Molt / Race: White Female   History of Present Illness The patient is a 65 year old female who presents for a follow-up for Breast mass. The patient is a 65 year old white female who is about 6 months status post right breast lumpectomy for atypical ductal hyperplasia. She recently underwent an MRI study which showed 2 areas of non-mass enhancement in the left breast and one in the right. All were biopsied. The right side also came back as atypical duct hyperplasia. The left side came back with atypical duct hyperplasia and a complex sclerosing lesion. She has been thinking a lot about it since this came up within 6 months of her last surgery. She is leaning towards bilateral mastectomies at this point. She denies any breast pain or discharge from the nipple.   Allergies  NSAIDs  Sulfa Antibiotics  Statins  Adhesive Tape  Allergies Reconciled   Medication History Furosemide (20MG  Tablet, Oral) Active. Potassium Chloride ER (10MEQ Tablet ER, Oral) Active. Sertraline HCl (100MG  Tablet, Oral) Active. Topiramate (25MG  Tablet, Oral) Active. Medications Reconciled    Review of Systems  General Not Present- Appetite Loss, Chills, Fatigue, Fever, Night Sweats, Weight Gain and Weight Loss. Note: All other systems negative (unless as noted in HPI & included Review of Systems) Skin Not Present- Change in Wart/Mole, Dryness, Hives, Jaundice, New Lesions, Non-Healing Wounds, Rash and Ulcer. HEENT Not Present- Earache, Hearing Loss, Hoarseness, Nose Bleed, Oral Ulcers, Ringing in the Ears, Seasonal Allergies, Sinus Pain, Sore Throat, Visual Disturbances, Wears glasses/contact lenses and Yellow Eyes. Respiratory Not Present- Bloody sputum, Chronic Cough, Difficulty Breathing, Snoring and Wheezing. Breast Not Present- Breast Mass, Breast Pain, Nipple Discharge and Skin  Changes. Cardiovascular Not Present- Chest Pain, Difficulty Breathing Lying Down, Leg Cramps, Palpitations, Rapid Heart Rate, Shortness of Breath and Swelling of Extremities. Gastrointestinal Not Present- Abdominal Pain, Bloating, Bloody Stool, Change in Bowel Habits, Chronic diarrhea, Constipation, Difficulty Swallowing, Excessive gas, Gets full quickly at meals, Hemorrhoids, Indigestion, Nausea, Rectal Pain and Vomiting. Female Genitourinary Not Present- Frequency, Nocturia, Painful Urination, Pelvic Pain and Urgency. Musculoskeletal Not Present- Back Pain, Joint Pain, Joint Stiffness, Muscle Pain, Muscle Weakness and Swelling of Extremities. Neurological Not Present- Decreased Memory, Fainting, Headaches, Numbness, Seizures, Tingling, Tremor, Trouble walking and Weakness. Psychiatric Not Present- Anxiety, Bipolar, Change in Sleep Pattern, Depression, Fearful and Frequent crying. Endocrine Not Present- Cold Intolerance, Excessive Hunger, Hair Changes, Heat Intolerance, Hot flashes and New Diabetes. Hematology Not Present- Easy Bruising, Excessive bleeding, Gland problems, HIV and Persistent Infections.  Vitals Weight: 275.38 lb Height: 65in Body Surface Area: 2.27 m Body Mass Index: 45.82 kg/m  Temp.: 97.37F  Pulse: 87 (Regular)  P.OX: 94% (Room air) BP: 130/80(Sitting, Left Arm, Standard)       Physical Exam  General Mental Status-Alert. General Appearance-Consistent with stated age. Hydration-Well hydrated. Voice-Normal.  Head and Neck Head-normocephalic, atraumatic with no lesions or palpable masses. Trachea-midline. Thyroid Gland Characteristics - normal size and consistency.  Eye Eyeball - Bilateral-Extraocular movements intact. Sclera/Conjunctiva - Bilateral-No scleral icterus.  Chest and Lung Exam Chest and lung exam reveals -quiet, even and easy respiratory effort with no use of accessory muscles and on auscultation, normal breath  sounds, no adventitious sounds and normal vocal resonance. Inspection Chest Wall - Normal. Back - normal.  Breast Note: The right breast periareolar incision is healing nicely with no sign of infection or seroma.   Cardiovascular Cardiovascular examination reveals -normal  heart sounds, regular rate and rhythm with no murmurs and normal pedal pulses bilaterally.  Abdomen Inspection Inspection of the abdomen reveals - No Hernias. Skin - Scar - no surgical scars. Palpation/Percussion Palpation and Percussion of the abdomen reveal - Soft, Non Tender, No Rebound tenderness, No Rigidity (guarding) and No hepatosplenomegaly. Auscultation Auscultation of the abdomen reveals - Bowel sounds normal.  Neurologic Neurologic evaluation reveals -alert and oriented x 3 with no impairment of recent or remote memory. Mental Status-Normal.  Musculoskeletal Normal Exam - Left-Upper Extremity Strength Normal and Lower Extremity Strength Normal. Normal Exam - Right-Upper Extremity Strength Normal and Lower Extremity Strength Normal.  Lymphatic Head & Neck  General Head & Neck Lymphatics: Bilateral - Description - Normal. Axillary  General Axillary Region: Bilateral - Description - Normal. Tenderness - Non Tender. Femoral & Inguinal  Generalized Femoral & Inguinal Lymphatics: Bilateral - Description - Normal. Tenderness - Non Tender.    Assessment & Plan  ATYPICAL DUCTAL HYPERPLASIA OF RIGHT BREAST (N60.91) ATYPICAL DUCTAL HYPERPLASIA OF LEFT BREAST (N60.92) Impression: The patient is about 6 months status post right breast lumpectomy for atypical ductal hyperplasia. She recently had an MRI study that showed 2 new areas in the left breast and one in the right. All 3 were biopsied and came back as either atypical duct hyperplasia or a complex sclerosing lesion. Given how quickly these areas have showed up after her previous surgery she is now leaning towards bilateral mastectomies. She  will meet with oncology to do genetic testing and talk about the options as well. I have discussed with her in detail the risks and benefits of the operation to remove both breasts as well as some of the technical aspects and she understands and wishes to proceed. She will let me know if she changes her mind but otherwise we can proceed with surgical planning. This patient encounter took 30 minutes today to perform the following: take history, perform exam, review outside records, interpret imaging, counsel the patient on their diagnosis and document encounter, findings & plan in the EHR

## 2020-07-17 NOTE — Anesthesia Preprocedure Evaluation (Signed)
Anesthesia Evaluation  Patient identified by MRN, date of birth, ID band Patient awake    Reviewed: Allergy & Precautions, NPO status , Patient's Chart, lab work & pertinent test results  Airway Mallampati: III  TM Distance: >3 FB Neck ROM: Full    Dental  (+) Dental Advisory Given, Partial Upper, Partial Lower   Pulmonary former smoker,    Pulmonary exam normal breath sounds clear to auscultation       Cardiovascular hypertension, Pt. on medications Normal cardiovascular exam Rhythm:Regular Rate:Normal     Neuro/Psych PSYCHIATRIC DISORDERS Anxiety Depression negative neurological ROS     GI/Hepatic Neg liver ROS, GERD  ,  Endo/Other  diabetes, Type 2Morbid obesity  Renal/GU negative Renal ROS     Musculoskeletal negative musculoskeletal ROS (+)   Abdominal   Peds  Hematology negative hematology ROS (+)   Anesthesia Other Findings Day of surgery medications reviewed with the patient.  Breast cancer  Reproductive/Obstetrics                             Anesthesia Physical Anesthesia Plan  ASA: III  Anesthesia Plan: General   Post-op Pain Management:  Regional for Post-op pain   Induction: Intravenous  PONV Risk Score and Plan: 3 and Midazolam, Dexamethasone and Ondansetron  Airway Management Planned: Oral ETT  Additional Equipment:   Intra-op Plan:   Post-operative Plan: Extubation in OR  Informed Consent: I have reviewed the patients History and Physical, chart, labs and discussed the procedure including the risks, benefits and alternatives for the proposed anesthesia with the patient or authorized representative who has indicated his/her understanding and acceptance.     Dental advisory given  Plan Discussed with: CRNA  Anesthesia Plan Comments:         Anesthesia Quick Evaluation

## 2020-07-17 NOTE — Anesthesia Postprocedure Evaluation (Signed)
Anesthesia Post Note  Patient: Bridget Conway  Procedure(s) Performed: BILATERAL MASTECTOMY (Bilateral Breast)     Patient location during evaluation: PACU Anesthesia Type: General Level of consciousness: awake and alert Pain management: pain level controlled Vital Signs Assessment: post-procedure vital signs reviewed and stable Respiratory status: spontaneous breathing, nonlabored ventilation, respiratory function stable and patient connected to nasal cannula oxygen Cardiovascular status: blood pressure returned to baseline and stable Postop Assessment: no apparent nausea or vomiting Anesthetic complications: no   No complications documented.  Last Vitals:  Vitals:   07/17/20 1215 07/17/20 1245  BP: 110/78 120/64  Pulse: 89 89  Resp: 17 16  Temp:  (!) 36.2 C  SpO2: 94% 94%    Last Pain:  Vitals:   07/17/20 1215  TempSrc:   PainSc: 2                  Catalina Gravel

## 2020-07-17 NOTE — Discharge Instructions (Signed)
° ° ° °  JP Drain Totals °· Bring this sheet to all of your post-operative appointments while you have your drains. °· Please measure your drains by CC's or ML's. °· Make sure you drain and measure your JP Drains 2 or 3 times per day. °· At the end of each day, add up totals for the left side and add up totals for the right side. °   ( 9 am )     ( 3 pm )        ( 9 pm )                °Date L  R  L  R  L  R  Total L/R  °               °               °               °               °               °               °               °               °               °               °               °               ° ° ° °Post Anesthesia Home Care Instructions ° °Activity: °Get plenty of rest for the remainder of the day. A responsible individual must stay with you for 24 hours following the procedure.  °For the next 24 hours, DO NOT: °-Drive a car °-Operate machinery °-Drink alcoholic beverages °-Take any medication unless instructed by your physician °-Make any legal decisions or sign important papers. ° °Meals: °Start with liquid foods such as gelatin or soup. Progress to regular foods as tolerated. Avoid greasy, spicy, heavy foods. If nausea and/or vomiting occur, drink only clear liquids until the nausea and/or vomiting subsides. Call your physician if vomiting continues. ° °Special Instructions/Symptoms: °Your throat may feel dry or sore from the anesthesia or the breathing tube placed in your throat during surgery. If this causes discomfort, gargle with warm salt water. The discomfort should disappear within 24 hours. ° °If you had a scopolamine patch placed behind your ear for the management of post- operative nausea and/or vomiting: ° °1. The medication in the patch is effective for 72 hours, after which it should be removed.  Wrap patch in a tissue and discard in the trash. Wash hands thoroughly with soap and water. °2. You may remove the patch earlier than 72 hours if you experience unpleasant side effects which  may include dry mouth, dizziness or visual disturbances. °3. Avoid touching the patch. Wash your hands with soap and water after contact with the patch. °  ° °

## 2020-07-17 NOTE — Op Note (Signed)
07/17/2020  10:58 AM  PATIENT:  Bridget Conway  65 y.o. female  PRE-OPERATIVE DIAGNOSIS:  BILATERAL ADH  POST-OPERATIVE DIAGNOSIS:  BILATERAL ADH  PROCEDURE:  Procedure(s): BILATERAL MASTECTOMY (Bilateral)  SURGEON:  Surgeon(s) and Role:    * Jovita Kussmaul, MD - Primary  PHYSICIAN ASSISTANT:   ASSISTANTS: Dr. Raeford Razor   ANESTHESIA:   general  EBL:  10 mL   BLOOD ADMINISTERED:none  DRAINS: (2) Jackson-Pratt drain(s) with closed bulb suction in the prepectoral space   LOCAL MEDICATIONS USED:  NONE  SPECIMEN:  Source of Specimen:  bilateral mastectomies  DISPOSITION OF SPECIMEN:  PATHOLOGY  COUNTS:  YES  TOURNIQUET:  * No tourniquets in log *  DICTATION: .Dragon Dictation   After informed consent was obtained the patient was brought to the operating room and placed in the supine position on the operating table.  After adequate induction of general anesthesia the patient's bilateral chest, breast, and axillary areas were prepped with ChloraPrep, allowed to dry, and draped in usual sterile manner.  An appropriate timeout was performed.  Attention was then turned to the right breast.  An elliptical incision was made around the nipple and areola complex in order to minimize the excess skin with a 10 blade knife.  The incision was carried through the skin and subcutaneous tissue sharply with the PlasmaBlade.  Breast hooks were used to elevate the skin flaps anteriorly towards the ceiling.  Skin flaps were then created by dissecting between the breast tissue and the subcutaneous fat and skin.  This dissection was carried all the way to the chest wall.  Next the breast was removed from the pectoralis muscle with the pectoralis fascia.  Once this was accomplished the entire right breast was removed and marked with a stitch on the lateral skin.  It was sent to pathology for further evaluation.  Hemostasis was achieved using the PlasmaBlade.  The wound was irrigated with copious amounts  of saline.  A small stab incision was made near the anterior axillary line inferior to the operative bed.  A tonsil clamp was placed through this opening and used to bring a 19 [place] round Blake drain into the operative bed.  The drain was curled along the chest wall.  The drain was anchored to the skin with a 3-0 nylon stitch.  Next the superior and inferior skin flaps were grossly reapproximated with interrupted 3-0 Vicryl stitches.  The skin was then closed with a running 4-0 Monocryl subcuticular stitch.  Of note the skin flaps were relatively thick but I believe we were in the appropriate plane to minimize any breast tissue left behind.  Attention was then turned to the left breast.  A similar elliptical incision was made around the nipple and areola complex in order to minimize the excess skin with a 10 blade knife.  The incision was carried through the skin and subcutaneous tissue sharply with the PlasmaBlade.  Breast hooks were used to elevate the skin flaps anteriorly towards the ceiling.  Skin flaps were then created by dissecting between the breast tissue and the subcutaneous fat and skin.  Again she had very thick flaps but we were in the correct plane.  This dissection was carried circumferentially all the way to the chest wall.  Next the breast was removed from the pectoralis muscle with the pectoralis fascia.  Once this was accomplished the entire left breast was removed and marked with a stitch on the lateral skin.  It was sent to pathology  for further evaluation.  Hemostasis was achieved using the PlasmaBlade.  The wound was irrigated with copious amounts of saline.  The small stab incision was made near the anterior axillary line inferior to the operative bed.  A tonsil clamp was used to bring a 19 [place] round Blake drain through this stab incision.  The drain was curled along the chest wall.  The drain was anchored to the skin with a 3-0 nylon stitch.  Next the superior and inferior skin flaps  were grossly reapproximated with interrupted 3-0 Vicryl stitches.  The skin was then closed with a running 4-0 Monocryl subcuticular stitch.  Dermabond dressings and drain dressings were applied along with a breast binder.  The patient tolerated the procedure well.  At the end of the case all needle sponge and instrument counts were correct.  The patient was then awakened and taken to recovery in stable condition.  PLAN OF CARE: Admit for overnight observation  PATIENT DISPOSITION:  PACU - hemodynamically stable.   Delay start of Pharmacological VTE agent (>24hrs) due to surgical blood loss or risk of bleeding: no

## 2020-07-17 NOTE — Anesthesia Procedure Notes (Addendum)
Anesthesia Regional Block: Pectoralis block   Pre-Anesthetic Checklist: ,, timeout performed, Correct Patient, Correct Site, Correct Laterality, Correct Procedure, Correct Position, site marked, Risks and benefits discussed,  Surgical consent,  Pre-op evaluation,  At surgeon's request and post-op pain management  Laterality: Left  Prep: chloraprep       Needles:  Injection technique: Single-shot  Needle Type: Echogenic Needle     Needle Length: 9cm  Needle Gauge: 21     Additional Needles:   Procedures:,,,, ultrasound used (permanent image in chart),,,,  Narrative:  Start time: 07/17/2020 8:10 AM End time: 07/17/2020 8:15 AM Injection made incrementally with aspirations every 5 mL.  Performed by: Personally  Anesthesiologist: Catalina Gravel, MD  Additional Notes: No pain on injection. No increased resistance to injection. Injection made in 5cc increments.  Good needle visualization.  Patient tolerated procedure well.  15cc of 0.5% Bupiv with epi mixed with 15cc NS for total of 30cc solution.

## 2020-07-17 NOTE — Progress Notes (Signed)
Assisted Dr. Gifford Shave with right and left, ultrasound guided, pectoralis blocks. Side rails up, monitors on throughout procedure. See vital signs in flow sheet. Tolerated Procedure well.

## 2020-07-17 NOTE — Interval H&P Note (Signed)
History and Physical Interval Note:  07/17/2020 8:15 AM  Bridget Conway  has presented today for surgery, with the diagnosis of BILATERAL ADH.  The various methods of treatment have been discussed with the patient and family. After consideration of risks, benefits and other options for treatment, the patient has consented to  Procedure(s): BILATERAL MASTECTOMY (Bilateral) as a surgical intervention.  The patient's history has been reviewed, patient examined, no change in status, stable for surgery.  I have reviewed the patient's chart and labs.  Questions were answered to the patient's satisfaction.     [name]

## 2020-07-17 NOTE — Transfer of Care (Signed)
Immediate Anesthesia Transfer of Care Note  Patient: Bridget Conway  Procedure(s) Performed: BILATERAL MASTECTOMY (Bilateral Breast)  Patient Location: PACU  Anesthesia Type:General and Regional  Level of Consciousness: drowsy  Airway & Oxygen Therapy: Patient Spontanous Breathing and Patient connected to face mask oxygen  Post-op Assessment: Report given to RN and Post -op Vital signs reviewed and stable  Post vital signs: Reviewed and stable  Last Vitals:  Vitals Value Taken Time  BP    Temp    Pulse 94 07/17/20 1112  Resp 25 07/17/20 1112  SpO2 96 % 07/17/20 1112  Vitals shown include unvalidated device data.  Last Pain:  Vitals:   07/17/20 0742  TempSrc: Oral  PainSc: 4       Patients Stated Pain Goal: 5 (46/27/03 5009)  Complications: No complications documented.

## 2020-07-20 ENCOUNTER — Telehealth: Payer: Self-pay | Admitting: Genetic Counselor

## 2020-07-20 ENCOUNTER — Ambulatory Visit: Payer: Self-pay | Admitting: Genetic Counselor

## 2020-07-20 ENCOUNTER — Encounter: Payer: Self-pay | Admitting: Genetic Counselor

## 2020-07-20 DIAGNOSIS — Z1379 Encounter for other screening for genetic and chromosomal anomalies: Secondary | ICD-10-CM

## 2020-07-20 NOTE — Telephone Encounter (Signed)
Revealed negative genetic testing. Discussed that we do not know why there is cancer in the family. There could be a genetic mutation in the family that Bridget Conway did not inherit. There could also be a mutation in a different gene that we are not testing, or our current technology may not be able to detect certain mutations. It will therefore be important for her to stay in contact with genetics to keep up with whether additional testing may be appropriate in the future.

## 2020-07-20 NOTE — Progress Notes (Signed)
HPI:  Bridget Conway was previously seen in the [place] clinic due to a family history of cancer and concerns regarding a hereditary predisposition to cancer. Please refer to our prior cancer genetics clinic note for more information regarding our discussion, assessment and recommendations, at the time. Bridget Conway recent genetic test results were disclosed to her, as were recommendations warranted by these results. These results and recommendations are discussed in more detail below.  FAMILY HISTORY:  We obtained a detailed, 4-generation family history.  Significant diagnoses are listed below: Family History  Problem Relation Age of Onset  . Breast cancer Mother 34  . Heart attack Father   . Cervical cancer Sister 25  . Uterine cancer Maternal Aunt 80  . Melanoma Cousin 31       head (maternal first cousin)  . Lung cancer Cousin 28       (paternal first cousin)  . Colon cancer Cousin 24       (paternal first cousin)  . Lung cancer Cousin 70       (paternal first cousin)  . Colon cancer Cousin 31       (paternal first cousin)  . Brain cancer Cousin 35       (paternal first cousin)  . Lung cancer Cousin 85       (paternal first cousin)  . Pancreatic cancer Cousin 43       (paternal first cousin)  . Esophageal cancer Neg Hx   . Rectal cancer Neg Hx   . Stomach cancer Neg Hx    Bridget Conway has one daughter (age 15). She has one brother (deceased age 56) and two sisters (one alive with a history of cervical cancer at 61, one deceased at age 1). She also has a paternal half-sister (age 79).  Bridget Conway mother died at age 25 from breast cancer (diagnosed age 84). There were three maternal aunts. One aunt was diagnosed with uterine cancer at age 90. One cousin was diagnosed with melanoma on his head/face at age 35. Bridget Conway maternal grandmother died at age 53 without cancer. Her maternal grandfather died at age 87 without cancer.  Bridget Conway father died at age 33  without cancer. There were seven paternal aunts and one paternal uncle. There is no known cancer among paternal aunts/uncles. Bridget Conway paternal grandmother died at age 50 without cancer. Her paternal grandfather died at age 28 without cancer. There were seven paternal first cousins who have had cancer - one with lung cancer (diagnosed age 53), a second with colon cancer (diagnosed age 25), a third with lung cancer (diagnosed age 26), a 57 with colon cancer (diagnosed age 16), a fifth with brain cancer (diagnosed age 74), a sixth with lung cancer (diagnosed age 33), and the seventh with pancreatic cancer (diagnosed age 24).  Bridget Conway is unaware of previous family history of genetic testing for hereditary cancer risks. Patient's ancestors are of [place], [place], and [place] descent. There is no reported Ashkenazi Jewish ancestry. There is no known consanguinity.  GENETIC TEST RESULTS: Genetic testing reported out on 07/14/2020 through the [facility] Common Hereditary Cancers + RNA panel. No pathogenic variants were detected.   The Common Hereditary Cancers Panel offered by [facility] includes sequencing and/or deletion duplication testing of the following 47genes: APC*, ATM*, AXIN2*, BARD1*, BMPR1A*, BRCA1*, BRCA2*, BRIP1*, CDH1*, CDK4, CDKN2A (p14ARF), CDKN2A (p16INK4a), CHEK2*, CTNNA1*, DICER1*, EPCAM (Deletion/duplication testing only), GREM1 (promoter region deletion/duplication testing only), KIT, MEN1*, MLH1*, MSH2*, MSH3*, MSH6*, MUTYH*, NBN*,  NF1*, NTHL1, PALB2*, PDGFRA, PMS2*, POLD1*, POLE*, PTEN*, RAD50*, RAD51C*, RAD51D*, SDHB*, SDHC*, SDHD*, SMAD4*, SMARCA4*, STK11*, TP53*, TSC1*, TSC2*, and VHL*.  The following genes were evaluated for sequence changes only: SDHA* and HOXB13 c.251G>A variant only. RNA analysis performed for * genes. The test report will be scanned into EPIC and located under the Molecular Pathology section of the Results Review tab.  A portion of the result report is included  below for reference.     We discussed with Bridget Conway that because current genetic testing is not perfect, it is possible there may be a gene mutation in one of these genes that current testing cannot detect, but that chance is small.  We also discussed that there could be another gene that has not yet been discovered, or that we have not yet tested, that is responsible for the cancer diagnoses in the family. It is also possible there is a hereditary cause for the cancer in the family that Bridget Conway did not inherit and therefore was not identified in her testing.  Therefore, it is important to remain in touch with cancer genetics in the future so that we can continue to offer Bridget Conway the most up to date genetic testing.   CANCER SCREENING RECOMMENDATIONS: Bridget Conway test result is considered negative (normal).  This means that we have not identified a hereditary cause for her family history of cancer at this time. While reassuring, this does not definitively rule out a hereditary predisposition to cancer. It is still possible that there could be genetic mutations that are undetectable by current technology. There could be genetic mutations in genes that have not been tested or identified to increase cancer risk. Therefore, it is recommended she continue to follow the cancer management and screening guidelines provided by her oncology and primary healthcare provider.   An individual's cancer risk and medical management are not determined by genetic test results alone. Overall cancer risk assessment incorporates additional factors, including personal medical history, family history, and any available genetic information that may result in a personalized plan for cancer prevention and surveillance.  RECOMMENDATIONS FOR FAMILY MEMBERS:  Individuals in this family might be at some increased risk of developing cancer, over the general population risk, simply due to the family history of cancer.  We  recommended women in this family have a yearly mammogram beginning at age 41, or 40 years younger than the earliest onset of cancer, an annual clinical breast exam, and perform monthly breast self-exams. Women in this family should also have a gynecological exam as recommended by their primary provider. All family members should be referred for colonoscopy starting at age 12.  It is also possible there is a hereditary cause for the cancer in Bridget Conway's family that she did not inherit and therefore was not identified in her.  Based on Bridget Conway's family history, we recommended her sister and nieces and nephews (children of deceased siblings) have genetic counseling and testing. Bridget Conway will let us know if we can be of any assistance in coordinating genetic counseling and/or testing for this family member.   FOLLOW-UP: Lastly, we discussed with Bridget Conway that cancer genetics is a rapidly advancing field and it is possible that new genetic tests will be appropriate for her and/or her family members in the future. We encouraged her to remain in contact with cancer genetics on an annual basis so we can update her personal and family histories and let her know of advances in cancer  genetics that may benefit this family.   Our contact number was provided. Bridget Conway questions were answered to her satisfaction, and she knows she is welcome to call us at anytime with additional questions or concerns.   Clint Guy, MS, [facility] Genetic Counselor [email or web address] Phone: [phone]

## 2020-07-21 ENCOUNTER — Encounter (HOSPITAL_BASED_OUTPATIENT_CLINIC_OR_DEPARTMENT_OTHER): Payer: Self-pay | Admitting: General Surgery

## 2020-07-21 NOTE — Addendum Note (Signed)
Addendum  created 07/21/20 1144 by Amal Saiki, Ernesta Amble, CRNA   Charge Capture section accepted

## 2020-07-22 LAB — SURGICAL PATHOLOGY

## 2020-08-25 ENCOUNTER — Other Ambulatory Visit: Payer: Self-pay | Admitting: *Deleted

## 2020-08-25 MED ORDER — POTASSIUM CHLORIDE ER 10 MEQ PO TBCR: 10.0000 meq | EXTENDED_RELEASE_TABLET | Freq: Two times a day (BID) | ORAL | 2 refills | Status: DC

## 2020-09-09 ENCOUNTER — Ambulatory Visit (INDEPENDENT_AMBULATORY_CARE_PROVIDER_SITE_OTHER): Payer: Medicare Other

## 2020-09-09 VITALS — Ht 65.0 in | Wt 275.0 lb

## 2020-09-09 DIAGNOSIS — Z0000 Encounter for general adult medical examination without abnormal findings: Secondary | ICD-10-CM

## 2020-09-09 DIAGNOSIS — G8929 Other chronic pain: Secondary | ICD-10-CM | POA: Insufficient documentation

## 2020-09-09 NOTE — Patient Instructions (Signed)
Bridget Conway , Thank you for taking time to come for your Medicare Wellness Visit. I appreciate your ongoing commitment to your health goals. Please review the following plan we discussed and let me know if I can assist you in the future.   Screening recommendations/referrals: Colonoscopy: Done 05/08/2012 - Repeat in 10 years Mammogram: No longer required since double mastectomy 07/2020 Bone Density: Done 05/10/2019 - Repeat every 5 years Recommended yearly ophthalmology/optometry visit for glaucoma screening and checkup Recommended yearly dental visit for hygiene and checkup  Vaccinations: Influenza vaccine: Done 1018/2021 - Repeat annually Pneumococcal vaccine: Prevnar Done 12/03/2014, due for Pneumovax Tdap vaccine: Done 06/05/2014 - Repeat in 10 years Shingles vaccine: Done 10/30/2017 & 02/07/2017   Covid-19: Done 04/26/19, 05/17/19, 01/07/2020  Advanced directives: Please bring a copy of your health care power of attorney and living will to the office to be added to your chart at your convenience.  Conditions/risks identified: Aim for 30 minutes of exercise or brisk walking each day, drink 6-8 glasses of water and eat lots of fruits and vegetables.  Next appointment: Follow up in one year for your annual wellness visit    Preventive Care 65 Years and Older, Female Preventive care refers to lifestyle choices and visits with your health care provider that can promote health and wellness. What does preventive care include?  A yearly physical exam. This is also called an annual well check.  Dental exams once or twice a year.  Routine eye exams. Ask your health care provider how often you should have your eyes checked.  Personal lifestyle choices, including:  Daily care of your teeth and gums.  Regular physical activity.  Eating a healthy diet.  Avoiding tobacco and drug use.  Limiting alcohol use.  Practicing safe sex.  Taking low-dose aspirin every day.  Taking vitamin and  mineral supplements as recommended by your health care provider. What happens during an annual well check? The services and screenings done by your health care provider during your annual well check will depend on your age, overall health, lifestyle risk factors, and family history of disease. Counseling  Your health care provider may ask you questions about your:  Alcohol use.  Tobacco use.  Drug use.  Emotional well-being.  Home and relationship well-being.  Sexual activity.  Eating habits.  History of falls.  Memory and ability to understand (cognition).  Work and work Statistician.  Reproductive health. Screening  You may have the following tests or measurements:  Height, weight, and BMI.  Blood pressure.  Lipid and cholesterol levels. These may be checked every 5 years, or more frequently if you are over 23 years old.  Skin check.  Lung cancer screening. You may have this screening every year starting at age 24 if you have a 30-pack-year history of smoking and currently smoke or have quit within the past 15 years.  Fecal occult blood test (FOBT) of the stool. You may have this test every year starting at age 87.  Flexible sigmoidoscopy or colonoscopy. You may have a sigmoidoscopy every 5 years or a colonoscopy every 10 years starting at age 35.  Hepatitis C blood test.  Hepatitis B blood test.  Sexually transmitted disease (STD) testing.  Diabetes screening. This is done by checking your blood sugar (glucose) after you have not eaten for a while (fasting). You may have this done every 1-3 years.  Bone density scan. This is done to screen for osteoporosis. You may have this done starting at age 51.  Mammogram. This may be done every 1-2 years. Talk to your health care provider about how often you should have regular mammograms. Talk with your health care provider about your test results, treatment options, and if necessary, the need for more tests. Vaccines   Your health care provider may recommend certain vaccines, such as:  Influenza vaccine. This is recommended every year.  Tetanus, diphtheria, and acellular pertussis (Tdap, Td) vaccine. You may need a Td booster every 10 years.  Zoster vaccine. You may need this after age 61.  Pneumococcal 13-valent conjugate (PCV13) vaccine. One dose is recommended after age 45.  Pneumococcal polysaccharide (PPSV23) vaccine. One dose is recommended after age 18. Talk to your health care provider about which screenings and vaccines you need and how often you need them. This information is not intended to replace advice given to you by your health care provider. Make sure you discuss any questions you have with your health care provider. Document Released: 04/24/2015 Document Revised: 12/16/2015 Document Reviewed: 01/27/2015 Elsevier Interactive Patient Education  2017 [employer] Prevention in the Home Falls can cause injuries. They can happen to people of all ages. There are many things you can do to make your home safe and to help prevent falls. What can I do on the outside of my home?  Regularly fix the edges of walkways and driveways and fix any cracks.  Remove anything that might make you trip as you walk through a door, such as a raised step or threshold.  Trim any bushes or trees on the path to your home.  Use bright outdoor lighting.  Clear any walking paths of anything that might make someone trip, such as rocks or tools.  Regularly check to see if handrails are loose or broken. Make sure that both sides of any steps have handrails.  Any raised decks and porches should have guardrails on the edges.  Have any leaves, snow, or ice cleared regularly.  Use sand or salt on walking paths during winter.  Clean up any spills in your garage right away. This includes oil or grease spills. What can I do in the bathroom?  Use night lights.  Install grab bars by the toilet and in the  tub and shower. Do not use towel bars as grab bars.  Use non-skid mats or decals in the tub or shower.  If you need to sit down in the shower, use a plastic, non-slip stool.  Keep the floor dry. Clean up any water that spills on the floor as soon as it happens.  Remove soap buildup in the tub or shower regularly.  Attach bath mats securely with double-sided non-slip rug tape.  Do not have throw rugs and other things on the floor that can make you trip. What can I do in the bedroom?  Use night lights.  Make sure that you have a light by your bed that is easy to reach.  Do not use any sheets or blankets that are too big for your bed. They should not hang down onto the floor.  Have a firm chair that has side arms. You can use this for support while you get dressed.  Do not have throw rugs and other things on the floor that can make you trip. What can I do in the kitchen?  Clean up any spills right away.  Avoid walking on wet floors.  Keep items that you use a lot in easy-to-reach places.  If you need to reach  something above you, use a strong step stool that has a grab bar.  Keep electrical cords out of the way.  Do not use floor polish or wax that makes floors slippery. If you must use wax, use non-skid floor wax.  Do not have throw rugs and other things on the floor that can make you trip. What can I do with my stairs?  Do not leave any items on the stairs.  Make sure that there are handrails on both sides of the stairs and use them. Fix handrails that are broken or loose. Make sure that handrails are as long as the stairways.  Check any carpeting to make sure that it is firmly attached to the stairs. Fix any carpet that is loose or worn.  Avoid having throw rugs at the top or bottom of the stairs. If you do have throw rugs, attach them to the floor with carpet tape.  Make sure that you have a light switch at the top of the stairs and the bottom of the stairs. If you  do not have them, ask someone to add them for you. What else can I do to help prevent falls?  Wear shoes that:  Do not have high heels.  Have rubber bottoms.  Are comfortable and fit you well.  Are closed at the toe. Do not wear sandals.  If you use a stepladder:  Make sure that it is fully opened. Do not climb a closed stepladder.  Make sure that both sides of the stepladder are locked into place.  Ask someone to hold it for you, if possible.  Clearly mark and make sure that you can see:  Any grab bars or handrails.  First and last steps.  Where the edge of each step is.  Use tools that help you move around (mobility aids) if they are needed. These include:  Canes.  Walkers.  Scooters.  Crutches.  Turn on the lights when you go into a dark area. Replace any light bulbs as soon as they burn out.  Set up your furniture so you have a clear path. Avoid moving your furniture around.  If any of your floors are uneven, fix them.  If there are any pets around you, be aware of where they are.  Review your medicines with your doctor. Some medicines can make you feel dizzy. This can increase your chance of falling. Ask your doctor what other things that you can do to help prevent falls. This information is not intended to replace advice given to you by your health care provider. Make sure you discuss any questions you have with your health care provider. Document Released: 01/22/2009 Document Revised: 09/03/2015 Document Reviewed: 05/02/2014 Elsevier Interactive Patient Education  2017 [employer].

## 2020-09-09 NOTE — Progress Notes (Signed)
Subjective:   Bridget Conway is a 65 y.o. female who presents for an Initial Medicare Annual Wellness Visit.   Virtual Visit via Telephone Note  I connected with  Bridget Conway on 09/09/20 at  2:45 PM EDT by telephone and verified that I am speaking with the correct person using two identifiers.  Location: Patient: Home Provider: [facility] Persons participating in the virtual visit: patient/Nurse Health Advisor   I discussed the limitations, risks, security and privacy concerns of performing an evaluation and management service by telephone and the availability of in person appointments. The patient expressed understanding and agreed to proceed.  Interactive audio and video telecommunications were attempted between this nurse and patient, however failed, due to patient having technical difficulties OR patient did not have access to video capability.  We continued and completed visit with audio only.  Some vital signs may be absent or patient reported.   Floyd Wade E Vontae Court, LPN   Review of Systems     Cardiac Risk Factors include: advanced age (>65men, >68 women);obesity (BMI >30kg/m2);sedentary lifestyle;dyslipidemia;hypertension     Objective:    Today's Vitals   09/09/20 1456  Weight: 275 lb (124.7 kg)  Height: 5\' 5"  (1.651 m)   Body mass index is 45.76 kg/m.  Advanced Directives 09/09/2020 07/17/2020 07/06/2020 12/27/2019 12/24/2019 07/09/2013  Does Patient Have a Medical Advance Directive? Yes Yes Yes No No Patient does not have advance directive;Patient would not like information  Type of Scientist, forensic Power of [place];Living will [place];Living will [place];Living will - - -  Copy of [place] in Chart? No - copy requested No - copy requested - - - -  Would patient like information on creating a medical advance directive? - - - Yes (MAU/Ambulatory/Procedural Areas - Information given) Yes  (MAU/Ambulatory/Procedural Areas - Information given) -    Current Medications (verified) Outpatient Encounter Medications as of 09/09/2020  Medication Sig  . calcium carbonate (OSCAL) 1500 (600 Ca) MG TABS tablet Take by mouth.  . Cholecalciferol 25 MCG (1000 UT) capsule Take by mouth.  . fish oil-omega-3 fatty acids 1000 MG capsule Take 1,200 mg by mouth daily.  . Flaxseed Oil (LINSEED OIL) OIL Take by mouth.  . Flaxseed, Linseed, (FLAXSEED OIL PO) Take 2,400 mg by mouth daily.  . fluticasone (FLONASE) 50 MCG/ACT nasal spray   . furosemide (LASIX) 20 MG tablet Take 1 tablet (20 mg total) by mouth daily.  Marland Kitchen HYDROcodone-acetaminophen (NORCO/VICODIN) 5-325 MG tablet Take 1-2 tablets by mouth every 6 (six) hours as needed for moderate pain or severe pain.  Marland Kitchen MELATONIN ER PO Take by mouth daily.  . Multiple Vitamins-Minerals (MULTIVITAMIN PO) Take by mouth daily.  . Oral Electrolytes (EMERGEN-C ELECTRO MIX) PACK Take by mouth.  . potassium chloride (KLOR-CON) 10 MEQ tablet Take 1 tablet (10 mEq total) by mouth 2 (two) times daily.  . Probiotic Product (PROBIOTIC DAILY PO) Take by mouth daily.  . sertraline (ZOLOFT) 100 MG tablet TAKE ONE (1) TABLET EACH DAY  . Turmeric 500 MG CAPS Take by mouth.  Marland Kitchen UNABLE TO FIND 2,000 mg by [facility] route 2 (two) times daily. Glucosamine   No facility-administered encounter medications on file as of 09/09/2020.    Allergies (verified) Latex, Lisinopril, Nsaids, Statins, and Sulfa antibiotics   History: Past Medical History:  Diagnosis Date  . Allergy   . Anxiety   . Atypical ductal hyperplasia of right breast   . Cancer ([place])  basal cell CA- below eye  . Depression   . Family history of brain cancer   . Family history of breast cancer   . Family history of cervical cancer   . Family history of colon cancer   . Family history of lung cancer   . Family history of melanoma   . Family history of pancreatic cancer   . Family history of uterine cancer    . GERD (gastroesophageal reflux disease)   . History of nonmelanoma skin cancer   . Hypertension    no meds, has white coat syndrome  . IFG (impaired fasting glucose) 2014  . Osteoporosis   . Wears glasses   . Wears partial dentures    upper partial   Past Surgical History:  Procedure Laterality Date  . ABDOMINAL HYSTERECTOMY    . BREAST LUMPECTOMY WITH RADIOACTIVE SEED LOCALIZATION Right 12/27/2019   Procedure: RIGHT BREAST LUMPECTOMY WITH RADIOACTIVE SEED LOCALIZATION;  Surgeon: Jovita Kussmaul, MD;  Location: [place];  Service: General;  Laterality: Right;  . CARPAL TUNNEL RELEASE Left 07/11/2013   Procedure: LEFT CARPAL TUNNEL RELEASE;  Surgeon: Tennis Must, MD;  Location: [place];  Service: Orthopedics;  Laterality: Left;  . carpel tunnel  2005   right wrist  . COLONOSCOPY    . hysterectomy  2002  . KNEE SURGERY Left   . LAPAROSCOPIC GASTRIC BANDING  2010  . TOTAL MASTECTOMY Bilateral 07/17/2020   Procedure: BILATERAL MASTECTOMY;  Surgeon: Jovita Kussmaul, MD;  Location: [place];  Service: General;  Laterality: Bilateral;   Family History  Problem Relation Age of Onset  . Breast cancer Mother 34  . Heart attack Father   . Cervical cancer Sister 51  . Uterine cancer Maternal Aunt 80  . Melanoma Cousin 42       head (maternal first cousin)  . Lung cancer Cousin 75       (paternal first cousin)  . Colon cancer Cousin 24       (paternal first cousin)  . Lung cancer Cousin 81       (paternal first cousin)  . Colon cancer Cousin 16       (paternal first cousin)  . Brain cancer Cousin 75       (paternal first cousin)  . Lung cancer Cousin 27       (paternal first cousin)  . Pancreatic cancer Cousin 58       (paternal first cousin)  . Esophageal cancer Neg Hx   . Rectal cancer Neg Hx   . Stomach cancer Neg Hx    Social History   Socioeconomic History  . Marital status: Significant Other    Spouse name: Not on file   . Number of children: 1  . Years of education: Not on file  . Highest education level: Not on file  Occupational History  . Occupation: retired  Tobacco Use  . Smoking status: Former Smoker    Quit date: 07/09/2000    Years since quitting: 20.1  . Smokeless tobacco: Never Used  Vaping Use  . Vaping Use: Never used  Substance and Sexual Activity  . Alcohol use: No  . Drug use: No  . Sexual activity: Not on file  Other Topics Concern  . Not on file  Social History Narrative   Lives with significant other   Double mastectomy in 07/2020 - Dr Marlou Starks   Routine visits with Dr Lindi Adie, Oncologist   Social Determinants  of Health   Financial Resource Strain: Low Risk   . Difficulty of Paying Living Expenses: Not hard at all  Food Insecurity: No Food Insecurity  . Worried About Charity fundraiser in the Last Year: Never true  . Ran Out of Food in the Last Year: Never true  Transportation Needs: No Transportation Needs  . Lack of Transportation (Medical): No  . Lack of Transportation (Non-Medical): No  Physical Activity: Insufficiently Active  . Days of Exercise per Week: 7 days  . Minutes of Exercise per Session: 20 min  Stress: No Stress Concern Present  . Feeling of Stress : Not at all  Social Connections: Moderately Integrated  . Frequency of Communication with Friends and Family: More than three times a week  . Frequency of Social Gatherings with Friends and Family: More than three times a week  . Attends Religious Services: Never  . Active Member of Clubs or Organizations: Yes  . Attends Archivist Meetings: More than 4 times per year  . Marital Status: Living with partner    Tobacco Counseling Counseling given: Not Answered   Clinical Intake:  Pre-visit preparation completed: Yes  Pain : No/denies pain     BMI - recorded: 45.76 Nutritional Status: BMI > 30  Obese Nutritional Risks: None Diabetes: No  How often do you need to have someone help you  when you read instructions, pamphlets, or other written materials from your doctor or pharmacy?: 1 - Never  Diabetic? No  Interpreter Needed?: No  Information entered by :: Tvisha Schwoerer, LPN   Activities of Daily Living In your present state of health, do you have any difficulty performing the following activities: 09/09/2020 07/17/2020  Hearing? N N  Vision? N N  Difficulty concentrating or making decisions? N N  Walking or climbing stairs? Y N  Dressing or bathing? N N  Doing errands, shopping? N -  Preparing Food and eating ? N -  Using the Toilet? N -  In the past six months, have you accidently leaked urine? N -  Do you have problems with loss of bowel control? N -  Managing your Medications? N -  Managing your Finances? N -  Housekeeping or managing your Housekeeping? N -  Some recent data might be hidden    Patient Care Team: Ivy [name], NP as PCP - General (Nurse Practitioner)  Indicate any recent Medical Services you may have received from other than [facility] providers in the past year (date may be approximate).     Assessment:   This is a routine wellness examination for [facility].  Hearing/Vision screen  Hearing Screening   125Hz  250Hz  500Hz  1000Hz  2000Hz  3000Hz  4000Hz  6000Hz  8000Hz   Right ear:           Left ear:           Comments: Denies hearing difficulties   Vision Screening Comments: Wears eyeglasses - up to date with routine eye exam at [facility] in [place] issues and exercise activities discussed: Current Exercise Habits: Home exercise routine, Type of exercise: walking, Time (Minutes): 15, Frequency (Times/Week): 7, Weekly Exercise (Minutes/Week): 105, Intensity: Mild, Exercise limited by: orthopedic condition(s)  Goals Addressed            This Visit's Progress   . Patient Stated       She would like to lose weight so that she can get knee operation to be able to get around better and not hurt  Depression Screen PHQ 2/9 Scores  09/09/2020 01/27/2020 12/19/2013  PHQ - 2 Score 1 1 0  PHQ- 9 Score - 7 -    Fall Risk Fall Risk  09/09/2020 01/27/2020 12/19/2013  Falls in the past year? 0 0 No  Number falls in past yr: 0 - -  Injury with Fall? 0 - -  Risk for fall due to : Orthopedic patient;Medication side effect;Impaired vision - -  Follow up Education provided;Falls prevention discussed - -    FALL RISK PREVENTION PERTAINING TO THE HOME:  Any stairs in or around the home? No  If so, are there any without handrails? No  Home free of loose throw rugs in walkways, pet beds, electrical cords, etc? Yes  Adequate lighting in your home to reduce risk of falls? Yes   ASSISTIVE DEVICES UTILIZED TO PREVENT FALLS:  Life alert? No  Use of a cane, walker or w/c? No  Grab bars in the bathroom? Yes  Shower chair or bench in shower? Yes  Elevated toilet seat or a handicapped toilet? No   TIMED UP AND GO:  Was the test performed? No . Telephonic visit  Cognitive Function:     6CIT Screen 09/09/2020  What Year? 0 points  What month? 0 points  What time? 0 points  Count back from 20 0 points  Months in reverse 0 points  Repeat phrase 0 points  Total Score 0    Immunizations Immunization History  Administered Date(s) Administered  . Fluad Quad(high Dose 65+) 01/27/2020  . Influenza Split 01/29/2013  . Influenza, High Dose Seasonal PF 01/02/2019  . Influenza-Unspecified 01/10/2015  . PFIZER(Purple Top)SARS-COV-2 Vaccination 04/26/2019, 05/17/2019  . Pneumococcal Polysaccharide-23 12/03/2014  . Tdap 06/05/2014  . Zoster Recombinat (Shingrix) 02/07/2017, 10/30/2017  . Zoster, Live 11/20/2012, 01/09/2013    TDAP status: Up to date  Flu Vaccine status: Up to date  Pneumococcal vaccine status: Due, Education has been provided regarding the importance of this vaccine. Advised may receive this vaccine at local pharmacy or Health Dept. Aware to provide a copy of the vaccination record if obtained from local pharmacy  or Health Dept. Verbalized acceptance and understanding.  Covid-19 vaccine status: Completed vaccines  Qualifies for Shingles Vaccine? Yes   Zostavax completed Yes   Shingrix Completed?: Yes  Screening Tests Health Maintenance  Topic Date Due  . HEMOGLOBIN A1C  06/19/2014  . FOOT EXAM  12/20/2014  . URINE MICROALBUMIN  12/20/2014  . COVID-19 Vaccine (3 - Pfizer risk 4-dose series) 06/14/2019  . OPHTHALMOLOGY EXAM  09/09/2020  . PNA vac Low Risk Adult (1 of 2 - PCV13) 01/26/2021 (Originally 07/09/2018)  . INFLUENZA VACCINE  11/09/2020  . MAMMOGRAM  10/09/2021  . COLONOSCOPY (Pts 45-13yrs Insurance coverage will need to be confirmed)  05/08/2022  . TETANUS/TDAP  06/05/2024  . DEXA SCAN  Completed  . Hepatitis C Screening  Completed  . Zoster Vaccines- Shingrix  Completed  . HPV VACCINES  Aged Out    Health Maintenance  Health Maintenance Due  Topic Date Due  . HEMOGLOBIN A1C  06/19/2014  . FOOT EXAM  12/20/2014  . URINE MICROALBUMIN  12/20/2014  . COVID-19 Vaccine (3 - Pfizer risk 4-dose series) 06/14/2019  . OPHTHALMOLOGY EXAM  09/09/2020    Colorectal cancer screening: Type of screening: Colonoscopy. Completed 05/08/2012. Repeat every 10 years  Mammogram status: No longer required due to double mastectomy.  Bone Density status: Completed 05/10/2019. Results reflect: Bone density results: NORMAL. Repeat every 5 years.  Lung Cancer Screening: (Low Dose CT Chest recommended if Age 82-80 years, 30 pack-year currently smoking OR have quit w/in 15years.) does not qualify.   Additional Screening:  Hepatitis C Screening: does qualify; Completed 01/28/2020  Vision Screening: Recommended annual ophthalmology exams for early detection of glaucoma and other disorders of the eye. Is the patient up to date with their annual eye exam?  Yes  Who is the provider or what is the name of the office in which the patient attends annual eye exams? [facility] If pt is not established with a  provider, would they like to be referred to a provider to establish care? No .   Dental Screening: Recommended annual dental exams for proper oral hygiene  Community Resource Referral / Chronic Care Management: CRR required this visit?  No   CCM required this visit?  No      Plan:     I have personally reviewed and noted the following in the patient's chart:   . Medical and social history . Use of alcohol, tobacco or illicit drugs  . Current medications and supplements including opioid prescriptions. Patient is currently taking opioid prescriptions. Information provided to patient regarding non-opioid alternatives. Patient advised to discuss non-opioid treatment plan with their provider. . Functional ability and status . Nutritional status . Physical activity . Advanced directives . List of other physicians . Hospitalizations, surgeries, and ER visits in previous 12 months . Vitals . Screenings to include cognitive, depression, and falls . Referrals and appointments  In addition, I have reviewed and discussed with patient certain preventive protocols, quality metrics, and best practice recommendations. A written personalized care plan for preventive services as well as general preventive health recommendations were provided to patient.     Sandrea Hammond, LPN   9/0/9311   Nurse Notes: None

## 2020-10-13 DIAGNOSIS — N6091 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: Secondary | ICD-10-CM | POA: Diagnosis not present

## 2020-10-13 DIAGNOSIS — N6092 Unspecified benign mammary dysplasia of left breast: Secondary | ICD-10-CM | POA: Diagnosis not present

## 2020-11-06 ENCOUNTER — Ambulatory Visit: Payer: Medicare Other | Admitting: Nurse Practitioner

## 2020-11-06 ENCOUNTER — Other Ambulatory Visit: Payer: Self-pay

## 2020-11-06 ENCOUNTER — Ambulatory Visit (INDEPENDENT_AMBULATORY_CARE_PROVIDER_SITE_OTHER): Payer: Medicare Other | Admitting: Nurse Practitioner

## 2020-11-06 VITALS — BP 131/81 | HR 86 | Temp 97.0°F | Ht 65.0 in | Wt 268.0 lb

## 2020-11-06 DIAGNOSIS — R1030 Lower abdominal pain, unspecified: Secondary | ICD-10-CM

## 2020-11-06 DIAGNOSIS — R399 Unspecified symptoms and signs involving the genitourinary system: Secondary | ICD-10-CM | POA: Diagnosis not present

## 2020-11-06 LAB — URINALYSIS, ROUTINE W REFLEX MICROSCOPIC
Bilirubin, UA: NEGATIVE
Glucose, UA: NEGATIVE
Ketones, UA: NEGATIVE
Nitrite, UA: NEGATIVE
Protein,UA: NEGATIVE
Specific Gravity, UA: 1.02 (ref 1.005–1.030)
Urobilinogen, Ur: 0.2 mg/dL (ref 0.2–1.0)
pH, UA: 6.5 (ref 5.0–7.5)

## 2020-11-06 LAB — MICROSCOPIC EXAMINATION

## 2020-11-06 MED ORDER — NITROFURANTOIN MONOHYD MACRO 100 MG PO CAPS: 100.0000 mg | ORAL_CAPSULE | Freq: Two times a day (BID) | ORAL | 0 refills | Status: DC

## 2020-11-06 NOTE — Progress Notes (Signed)
Acute Office Visit  Subjective:    Patient ID: Bridget Conway, female    DOB: 1955/09/20, 65 y.o.   MRN: [id number]  Chief Complaint  Patient presents with   Abdominal Pain    Abdominal Pain This is a new problem. The current episode started yesterday. The onset quality is gradual. The problem occurs constantly. The most recent episode lasted 2 days. The problem has been unchanged. The pain is located in the RLQ and LLQ. The pain is mild. The quality of the pain is aching. The abdominal pain does not radiate. Associated symptoms include dysuria. Pertinent negatives include no anorexia, diarrhea, fever, frequency, nausea or vomiting. Nothing aggravates the pain. The pain is relieved by Nothing. She has tried nothing for the symptoms. The treatment provided no relief. Hx UTI  Dysuria  This is a new problem. The current episode started yesterday. The problem occurs intermittently. The problem has been gradually worsening. The quality of the pain is described as aching. The pain is at a severity of 5/10. The pain is moderate. There has been no fever. There is A history of pyelonephritis. Pertinent negatives include no chills, discharge, frequency, nausea or vomiting. She has tried nothing for the symptoms. Her past medical history is significant for kidney stones. Hx UTI   Past Medical History:  Diagnosis Date   Allergy    Anxiety    Atypical ductal hyperplasia of right breast    Cancer ([place])    basal cell CA- below eye   Depression    Family history of brain cancer    Family history of breast cancer    Family history of cervical cancer    Family history of colon cancer    Family history of lung cancer    Family history of melanoma    Family history of pancreatic cancer    Family history of uterine cancer    GERD (gastroesophageal reflux disease)    History of nonmelanoma skin cancer    Hypertension    no meds, has white coat syndrome   IFG (impaired fasting glucose) 2014    Osteoporosis    Wears glasses    Wears partial dentures    upper partial    Past Surgical History:  Procedure Laterality Date   ABDOMINAL HYSTERECTOMY     BREAST LUMPECTOMY WITH RADIOACTIVE SEED LOCALIZATION Right 12/27/2019   Procedure: RIGHT BREAST LUMPECTOMY WITH RADIOACTIVE SEED LOCALIZATION;  Surgeon: Jovita Kussmaul, MD;  Location: [facility];  Service: General;  Laterality: Right;   CARPAL TUNNEL RELEASE Left 07/11/2013   Procedure: LEFT CARPAL TUNNEL RELEASE;  Surgeon: Tennis Must, MD;  Location: [facility];  Service: Orthopedics;  Laterality: Left;   carpel tunnel  2005   right wrist   COLONOSCOPY     hysterectomy  2002   KNEE SURGERY Left    LAPAROSCOPIC GASTRIC BANDING  2010   TOTAL MASTECTOMY Bilateral 07/17/2020   Procedure: BILATERAL MASTECTOMY;  Surgeon: Jovita Kussmaul, MD;  Location: [facility];  Service: General;  Laterality: Bilateral;    Family History  Problem Relation Age of Onset   Breast cancer Mother 33   Heart attack Father    Cervical cancer Sister 57   Uterine cancer Maternal Aunt 77   Melanoma Cousin 43       head (maternal first cousin)   Lung cancer Cousin 83       (paternal first cousin)   Colon cancer Cousin 66       (  paternal first cousin)   Lung cancer Cousin 39       (paternal first cousin)   Colon cancer Cousin 48       (paternal first cousin)   Brain cancer Cousin 58       (paternal first cousin)   Lung cancer Cousin 58       (paternal first cousin)   Pancreatic cancer Cousin 21       (paternal first cousin)   Esophageal cancer Neg Hx    Rectal cancer Neg Hx    Stomach cancer Neg Hx     Social History   Socioeconomic History   Marital status: Significant Other    Spouse name: Not on file   Number of children: 1   Years of education: Not on file   Highest education level: Not on file  Occupational History   Occupation: retired  Tobacco Use   Smoking status: Former    Types:  Cigarettes    Quit date: 07/09/2000    Years since quitting: 20.3   Smokeless tobacco: Never  Vaping Use   Vaping Use: Never used  Substance and Sexual Activity   Alcohol use: No   Drug use: No   Sexual activity: Not on file  Other Topics Concern   Not on file  Social History Narrative   Lives with significant other   Double mastectomy in 07/2020 - Dr Marlou Starks   Routine visits with Dr Lindi Adie, Oncologist   Social Determinants of Health   Financial Resource Strain: Low Risk    Difficulty of Paying Living Expenses: Not hard at all  Food Insecurity: No Food Insecurity   Worried About Charity fundraiser in the Last Year: Never true   [place] in the Last Year: Never true  Transportation Needs: No Transportation Needs   Lack of Transportation (Medical): No   Lack of Transportation (Non-Medical): No  Physical Activity: Insufficiently Active   Days of Exercise per Week: 7 days   Minutes of Exercise per Session: 20 min  Stress: No Stress Concern Present   Feeling of Stress : Not at all  Social Connections: Moderately Integrated   Frequency of Communication with Friends and Family: More than three times a week   Frequency of Social Gatherings with Friends and Family: More than three times a week   Attends Religious Services: Never   Marine scientist or Organizations: Yes   Attends Music therapist: More than 4 times per year   Marital Status: Living with partner  Intimate Partner Violence: Not At Risk   Fear of Current or Ex-Partner: No   Emotionally Abused: No   Physically Abused: No   Sexually Abused: No    Outpatient Medications Prior to Visit  Medication Sig Dispense Refill   calcium carbonate (OSCAL) 1500 (600 Ca) MG TABS tablet Take by mouth.     Cholecalciferol 25 MCG (1000 UT) capsule Take by mouth.     fish oil-omega-3 fatty acids 1000 MG capsule Take 1,200 mg by mouth daily.     Flaxseed Oil (LINSEED OIL) OIL Take by mouth.     Flaxseed,  Linseed, (FLAXSEED OIL PO) Take 2,400 mg by mouth daily.     fluticasone (FLONASE) 50 MCG/ACT nasal spray      furosemide (LASIX) 20 MG tablet Take 1 tablet (20 mg total) by mouth daily. 90 tablet 1   MELATONIN ER PO Take by mouth daily.     Multiple Vitamins-Minerals (MULTIVITAMIN PO)  Take by mouth daily.     Oral Electrolytes (EMERGEN-C ELECTRO MIX) PACK Take by mouth.     potassium chloride (KLOR-CON) 10 MEQ tablet Take 1 tablet (10 mEq total) by mouth 2 (two) times daily. 120 tablet 2   Probiotic Product (PROBIOTIC DAILY PO) Take by mouth daily.     sertraline (ZOLOFT) 100 MG tablet TAKE ONE (1) TABLET EACH DAY 90 tablet 1   Turmeric 500 MG CAPS Take by mouth.     UNABLE TO FIND 2,000 mg by [facility] route 2 (two) times daily. Glucosamine     HYDROcodone-acetaminophen (NORCO/VICODIN) 5-325 MG tablet Take 1-2 tablets by mouth every 6 (six) hours as needed for moderate pain or severe pain. 15 tablet 0   No facility-administered medications prior to visit.    Allergies  Allergen Reactions   Latex    Lisinopril    Nsaids    Statins    Sulfa Antibiotics Hives    Review of Systems  Constitutional:  Negative for chills and fever.  HENT: Negative.    Eyes: Negative.   Gastrointestinal:  Positive for abdominal pain. Negative for anorexia, diarrhea, nausea and vomiting.  Genitourinary:  Positive for dysuria. Negative for frequency.  Skin:  Negative for rash.  All other systems reviewed and are negative.     Objective:    Physical Exam Vitals and nursing note reviewed.  Constitutional:      Appearance: She is well-developed.  HENT:     Head: Normocephalic.     Nose: Nose normal.  Eyes:     Conjunctiva/sclera: Conjunctivae normal.  Cardiovascular:     Rate and Rhythm: Normal rate and regular rhythm.     Pulses: Normal pulses.     Heart sounds: Normal heart sounds.  Pulmonary:     Effort: Pulmonary effort is normal.     Breath sounds: Normal breath sounds.  Abdominal:      Tenderness: There is abdominal tenderness in the right lower quadrant and left lower quadrant. There is no right CVA tenderness or left CVA tenderness.  Skin:    Findings: No rash.  Neurological:     Mental Status: She is alert and oriented to person, place, and time.  Psychiatric:        Behavior: Behavior normal.    BP 131/81   Pulse 86   Temp (!) 97 F (36.1 C) (Temporal)   Ht '5\' 5"'$  (1.651 m)   Wt 268 lb (121.6 kg)   SpO2 97%   BMI 44.60 kg/m  Wt Readings from Last 3 Encounters:  11/06/20 268 lb (121.6 kg)  09/09/20 275 lb (124.7 kg)  07/17/20 276 lb 14.4 oz (125.6 kg)    Health Maintenance Due  Topic Date Due   COVID-19 Vaccine (4 - Booster for Pfizer series) 04/07/2020    There are no preventive care reminders to display for this patient.   No results found for: TSH Lab Results  Component Value Date   WBC 5.6 01/28/2020   HGB 14.0 01/28/2020   HCT 40.7 01/28/2020   MCV 94 01/28/2020   PLT 253 01/28/2020   Lab Results  Component Value Date   NA 139 07/10/2020   K 4.0 07/10/2020   CO2 30 07/10/2020   GLUCOSE 108 (H) 07/10/2020   BUN 19 07/10/2020   CREATININE 0.65 07/10/2020   BILITOT 0.3 12/19/2013   ALKPHOS 68 12/19/2013   AST 18 12/19/2013   ALT 16 12/19/2013   PROT 7.4 12/19/2013   ALBUMIN 4.1  12/19/2013   CALCIUM 8.9 07/10/2020   ANIONGAP 6 07/10/2020   Lab Results  Component Value Date   CHOL 227 (H) 01/28/2020   Lab Results  Component Value Date   HDL 46 01/28/2020   Lab Results  Component Value Date   LDLCALC 158 (H) 01/28/2020   Lab Results  Component Value Date   TRIG 127 01/28/2020   Lab Results  Component Value Date   CHOLHDL 4.9 (H) 01/28/2020   Lab Results  Component Value Date   HGBA1C 5.5 12/19/2013       Assessment & Plan:   Problem List Items Addressed This Visit       Other   UTI symptoms    Lower quadrant abdominal pain not well controlled in the past few days.  Slight UTI symptoms of frequency but no  burning, nausea, fever or chills.  Completed urinalysis leukocytes present in urine, trace blood, slightly cloudy urine with few bacteria and mucus present.  Urine sent for cultures.  Started patient on nitrofurantoin by mouth pending culture results.  Education provided to patient to increase hydration, printed handouts given, patient verbalized understanding.  Rx sent to pharmacy.       Relevant Medications   nitrofurantoin, macrocrystal-monohydrate, (MACROBID) 100 MG capsule   Other Relevant Orders   Urine Culture   Other Visit Diagnoses     Lower abdominal pain    -  Primary   Relevant Orders   Urinalysis, Routine w reflex microscopic        Meds ordered this encounter  Medications   nitrofurantoin, macrocrystal-monohydrate, (MACROBID) 100 MG capsule    Sig: Take 1 capsule (100 mg total) by mouth 2 (two) times daily. 1 po BId    Dispense:  14 capsule    Refill:  0    Order Specific Question:   Supervising Provider    Answer:   Janora Norlander [id number]     Ivy [name], NP

## 2020-11-06 NOTE — Assessment & Plan Note (Signed)
Lower quadrant abdominal pain not well controlled in the past few days.  Slight UTI symptoms of frequency but no burning, nausea, fever or chills.  Completed urinalysis leukocytes present in urine, trace blood, slightly cloudy urine with few bacteria and mucus present.  Urine sent for cultures.  Started patient on nitrofurantoin by mouth pending culture results.  Education provided to patient to increase hydration, printed handouts given, patient verbalized understanding.  Rx sent to pharmacy.

## 2020-11-06 NOTE — Patient Instructions (Signed)

## 2020-11-08 LAB — URINE CULTURE

## 2020-11-09 ENCOUNTER — Other Ambulatory Visit: Payer: Self-pay | Admitting: Nurse Practitioner

## 2020-11-09 MED ORDER — FLUCONAZOLE 150 MG PO TABS: 150.0000 mg | ORAL_TABLET | Freq: Once | ORAL | 0 refills | Status: AC

## 2020-12-30 ENCOUNTER — Other Ambulatory Visit: Payer: Self-pay | Admitting: Nurse Practitioner

## 2020-12-30 DIAGNOSIS — F331 Major depressive disorder, recurrent, moderate: Secondary | ICD-10-CM

## 2021-01-06 NOTE — Progress Notes (Signed)
Patient Care Team: Ivy [name], NP as PCP - General (Nurse Practitioner)  DIAGNOSIS:    ICD-10-CM   1. Atypical ductal hyperplasia of both breasts  N60.91    N60.92       CHIEF COMPLIANT: Follow-up of high risk of breast cancer  INTERVAL HISTORY: Bridget Conway is a 65 y.o. with above-mentioned history of high risk for breast cancer. Bilateral mastectomies on 07/17/2020 showed usual duct epithelial hyperplasia. She presents to the clinic today for follow-up.  She is healed and recovered very well from the mastectomies done in April.  Denies any lumps or nodules.  Denies any pain or discomfort.  The scar tissue feels slightly tender intermittently.  ALLERGIES:  is allergic to latex, lisinopril, nsaids, statins, and sulfa antibiotics.  MEDICATIONS:  Current Outpatient Medications  Medication Sig Dispense Refill   calcium carbonate (OSCAL) 1500 (600 Ca) MG TABS tablet Take by mouth.     Cholecalciferol 25 MCG (1000 UT) capsule Take by mouth.     fish oil-omega-3 fatty acids 1000 MG capsule Take 1,200 mg by mouth daily.     Flaxseed Oil (LINSEED OIL) OIL Take by mouth.     Flaxseed, Linseed, (FLAXSEED OIL PO) Take 2,400 mg by mouth daily.     fluticasone (FLONASE) 50 MCG/ACT nasal spray      furosemide (LASIX) 20 MG tablet Take 1 tablet (20 mg total) by mouth daily. 90 tablet 1   MELATONIN ER PO Take by mouth daily.     Multiple Vitamins-Minerals (MULTIVITAMIN PO) Take by mouth daily.     nitrofurantoin, macrocrystal-monohydrate, (MACROBID) 100 MG capsule Take 1 capsule (100 mg total) by mouth 2 (two) times daily. 1 po BId 14 capsule 0   Oral Electrolytes (EMERGEN-C ELECTRO MIX) PACK Take by mouth.     potassium chloride (KLOR-CON) 10 MEQ tablet Take 1 tablet (10 mEq total) by mouth 2 (two) times daily. 120 tablet 2   Probiotic Product (PROBIOTIC DAILY PO) Take by mouth daily.     sertraline (ZOLOFT) 100 MG tablet TAKE ONE (1) TABLET EACH DAY 90 tablet 0   Turmeric 500 MG  CAPS Take by mouth.     UNABLE TO FIND 2,000 mg by [facility] route 2 (two) times daily. Glucosamine     No current facility-administered medications for this visit.    PHYSICAL EXAMINATION: ECOG PERFORMANCE STATUS: 1 - Symptomatic but completely ambulatory  Vitals:   01/07/21 1350  BP: 133/87  Pulse: 80  Resp: 18  Temp: 97.9 F (36.6 C)  SpO2: 97%   Filed Weights   01/07/21 1350  Weight: 263 lb 6.4 oz (119.5 kg)    BREAST: Scars related to bilateral mastectomies have no lumps or nodules.  (exam performed in the presence of a chaperone)  LABORATORY DATA:  I have reviewed the data as listed CMP Latest Ref Rng & Units 07/10/2020 12/24/2019 12/19/2013  Glucose 70 - 99 mg/dL 108(H) 97 125(H)  BUN 8 - 23 mg/dL 19 19 17   Creatinine 0.44 - 1.00 mg/dL 0.65 0.63 0.71  Sodium 135 - 145 mmol/L 139 140 139  Potassium 3.5 - 5.1 mmol/L 4.0 4.2 4.1  Chloride 98 - 111 mmol/L 103 104 94(L)  CO2 22 - 32 mmol/L 30 26 28   Calcium 8.9 - 10.3 mg/dL 8.9 8.9 9.4  Total Protein 6.0 - 8.5 g/dL - - 7.4  Total Bilirubin 0.0 - 1.2 mg/dL - - 0.3  Alkaline Phos 39 - 117 IU/L - - 68  AST  0 - 40 IU/L - - 18  ALT 0 - 32 IU/L - - 16    Lab Results  Component Value Date   WBC 5.6 01/28/2020   HGB 14.0 01/28/2020   HCT 40.7 01/28/2020   MCV 94 01/28/2020   PLT 253 01/28/2020   NEUTROABS 3.2 01/28/2020    ASSESSMENT & PLAN:  Atypical ductal hyperplasia of both breasts Mammogram 10/10/2019: Indeterminate calcifications spanning 1.2 cm superior central right breast Right breast biopsy 10/17/2019: Focal atypical ductal hyperplasia, microscopic intraductal papilloma with calcifications 12/27/2019: Right lumpectomy: Ductal papilloma with focal ADH, margins negative, no evidence of carcinoma, did not take tamoxifen because of concern for side effects  Bilateral mastectomies 07/17/2020: Right mastectomy:[facility], left mastectomy:UD[facility] Breast cancer surveillance: Since she had bilateral mastectomies there is no further role  of imaging studies. Patient does not need any active follow-ups with [place].   Return to clinic on an as-needed basis.    No orders of the defined types were placed in this encounter.  The patient has a good understanding of the overall plan. she agrees with it. she will call with any problems that may develop before the next visit here.  Total time spent: 20 mins including face to face time and time spent for planning, charting and coordination of care  Rulon Eisenmenger, MD, MPH 01/07/2021  I, Thana Ates, am acting as scribe for Dr. Nicholas Lose.  I have reviewed the above documentation for accuracy and completeness, and I agree with the above.

## 2021-01-07 ENCOUNTER — Inpatient Hospital Stay: Payer: Medicare Other | Attending: Hematology and Oncology | Admitting: Hematology and Oncology

## 2021-01-07 ENCOUNTER — Other Ambulatory Visit: Payer: Self-pay

## 2021-01-07 DIAGNOSIS — N6091 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: Secondary | ICD-10-CM | POA: Diagnosis not present

## 2021-01-07 DIAGNOSIS — N6092 Unspecified benign mammary dysplasia of left breast: Secondary | ICD-10-CM

## 2021-01-07 DIAGNOSIS — N6099 Unspecified benign mammary dysplasia of unspecified breast: Secondary | ICD-10-CM | POA: Diagnosis not present

## 2021-01-07 DIAGNOSIS — Z9013 Acquired absence of bilateral breasts and nipples: Secondary | ICD-10-CM | POA: Insufficient documentation

## 2021-01-07 NOTE — Assessment & Plan Note (Signed)
Mammogram 10/10/2019: Indeterminate calcifications spanning 1.2 cm superior central right breast Right breast biopsy 10/17/2019: Focal atypical ductal hyperplasia, microscopic intraductal papilloma with calcifications 12/27/2019: Right lumpectomy: Ductal papilloma with focal ADH, margins negative, no evidence of carcinoma, did not take tamoxifen because of concern for side effects  Bilateral mastectomies 07/17/2020: Right mastectomy:[facility], left mastectomy:UDH Breast cancer surveillance: Since she had bilateral mastectomies there is no further role of imaging studies. Patient does not need any active follow-ups with [place].   Return to clinic on an as-needed basis.

## 2021-01-26 ENCOUNTER — Ambulatory Visit (INDEPENDENT_AMBULATORY_CARE_PROVIDER_SITE_OTHER): Payer: Medicare Other | Admitting: Nurse Practitioner

## 2021-01-26 ENCOUNTER — Encounter: Payer: Self-pay | Admitting: Nurse Practitioner

## 2021-01-26 ENCOUNTER — Other Ambulatory Visit: Payer: Self-pay

## 2021-01-26 VITALS — BP 122/81 | HR 74 | Temp 97.4°F | Ht 65.0 in | Wt 266.0 lb

## 2021-01-26 DIAGNOSIS — Z23 Encounter for immunization: Secondary | ICD-10-CM | POA: Diagnosis not present

## 2021-01-26 DIAGNOSIS — Z0000 Encounter for general adult medical examination without abnormal findings: Secondary | ICD-10-CM | POA: Diagnosis not present

## 2021-01-26 NOTE — Addendum Note (Signed)
Addended byCarrolyn Leigh on: 01/26/2021 03:36 PM   Modules accepted: Orders

## 2021-01-26 NOTE — Assessment & Plan Note (Signed)
Completed head to toe assessment, no new concerns. Education provided to patient printed hand out given.   Labs completed- CBC, CMP, Lipid panel

## 2021-01-26 NOTE — Progress Notes (Signed)
Established Patient Office Visit  Subjective:  Patient ID: Bridget Conway, female    DOB: [birth year]  Age: 65 y.o. MRN: [id number]  CC:  Chief Complaint  Patient presents with   Annual Exam    HPI Bridget Conway presents for .   Encounter for general adult medical examination Physical: Patient's last physical exam was 1 year ago .  Weight: Appropriate for height (BMI less greater 27%) ;  Blood Pressure: Normal (BP less than 120/80) ;  Medical History: Patient history reviewed ; Family history reviewed ;  Allergies Reviewed: No change in current allergies ;  Medications Reviewed: Medications reviewed - no changes ;  Lipids: Normal lipid levels ; completed results pending Smoking: Life-long non-smoker ;  Physical Activity: Exercises at least 3 times per week ;  Alcohol/Drug Use: Is a non-drinker ; No illicit drug use ;  Patient is not afflicted from Stress Incontinence and Urge Incontinence  Safety: reviewed ; Patient wears a seat belt, has smoke detectors, has carbon monoxide detectors, practices appropriate gun safety, and wears sunscreen with extended sun exposure. Dental Care: biannual cleanings, brushes and flosses daily. Ophthalmology/Optometry: Annual visit.  Hearing loss: none Vision impairments: wears prescription glasses   Past Medical History:  Diagnosis Date   Allergy    Anxiety    Atypical ductal hyperplasia of right breast    Cancer (HCC)    basal cell CA- below eye   Depression    Family history of brain cancer    Family history of breast cancer    Family history of cervical cancer    Family history of colon cancer    Family history of lung cancer    Family history of melanoma    Family history of pancreatic cancer    Family history of uterine cancer    GERD (gastroesophageal reflux disease)    History of nonmelanoma skin cancer    Hypertension    no meds, has white coat syndrome   IFG (impaired fasting glucose) 2014   Osteoporosis    Wears  glasses    Wears partial dentures    upper partial    Past Surgical History:  Procedure Laterality Date   ABDOMINAL HYSTERECTOMY     BREAST LUMPECTOMY WITH RADIOACTIVE SEED LOCALIZATION Right 12/27/2019   Procedure: RIGHT BREAST LUMPECTOMY WITH RADIOACTIVE SEED LOCALIZATION;  Surgeon: Jovita Kussmaul, MD;  Location: [facility];  Service: General;  Laterality: Right;   CARPAL TUNNEL RELEASE Left 07/11/2013   Procedure: LEFT CARPAL TUNNEL RELEASE;  Surgeon: Tennis Must, MD;  Location: [facility];  Service: Orthopedics;  Laterality: Left;   carpel tunnel  2005   right wrist   COLONOSCOPY     hysterectomy  2002   KNEE SURGERY Left    LAPAROSCOPIC GASTRIC BANDING  2010   TOTAL MASTECTOMY Bilateral 07/17/2020   Procedure: BILATERAL MASTECTOMY;  Surgeon: Jovita Kussmaul, MD;  Location: [facility];  Service: General;  Laterality: Bilateral;    Family History  Problem Relation Age of Onset   Breast cancer Mother 74   Heart attack Father    Cervical cancer Sister 6   Uterine cancer Maternal Aunt 6   Melanoma Cousin 5       head (maternal first cousin)   Lung cancer Cousin 63       (paternal first cousin)   Colon cancer Cousin 8       (paternal first cousin)   Lung cancer Cousin  44       (paternal first cousin)   Colon cancer Cousin 31       (paternal first cousin)   Brain cancer Cousin 71       (paternal first cousin)   Lung cancer Cousin 47       (paternal first cousin)   Pancreatic cancer Cousin 38       (paternal first cousin)   Esophageal cancer Neg Hx    Rectal cancer Neg Hx    Stomach cancer Neg Hx     Social History   Socioeconomic History   Marital status: Significant Other    Spouse name: Not on file   Number of children: 1   Years of education: Not on file   Highest education level: Not on file  Occupational History   Occupation: retired  Tobacco Use   Smoking status: Former    Types: Cigarettes    Quit date:  07/09/2000    Years since quitting: 20.5   Smokeless tobacco: Never  Vaping Use   Vaping Use: Never used  Substance and Sexual Activity   Alcohol use: No   Drug use: No   Sexual activity: Not on file  Other Topics Concern   Not on file  Social History Narrative   Lives with significant other   Double mastectomy in 07/2020 - Dr Marlou Starks   Routine visits with Dr Lindi Adie, Oncologist   Social Determinants of Health   Financial Resource Strain: Low Risk    Difficulty of Paying Living Expenses: Not hard at all  Food Insecurity: No Food Insecurity   Worried About Charity fundraiser in the Last Year: Never true   [place] in the Last Year: Never true  Transportation Needs: No Transportation Needs   Lack of Transportation (Medical): No   Lack of Transportation (Non-Medical): No  Physical Activity: Insufficiently Active   Days of Exercise per Week: 7 days   Minutes of Exercise per Session: 20 min  Stress: No Stress Concern Present   Feeling of Stress : Not at all  Social Connections: Moderately Integrated   Frequency of Communication with Friends and Family: More than three times a week   Frequency of Social Gatherings with Friends and Family: More than three times a week   Attends Religious Services: Never   Marine scientist or Organizations: Yes   Attends Music therapist: More than 4 times per year   Marital Status: Living with partner  Intimate Partner Violence: Not At Risk   Fear of Current or Ex-Partner: No   Emotionally Abused: No   Physically Abused: No   Sexually Abused: No    Outpatient Medications Prior to Visit  Medication Sig Dispense Refill   calcium carbonate (OSCAL) 1500 (600 Ca) MG TABS tablet Take by mouth.     Cholecalciferol 25 MCG (1000 UT) capsule Take by mouth.     fish oil-omega-3 fatty acids 1000 MG capsule Take 1,200 mg by mouth daily.     Flaxseed Oil (LINSEED OIL) OIL Take by mouth.     Flaxseed, Linseed, (FLAXSEED OIL PO) Take  2,400 mg by mouth daily.     fluticasone (FLONASE) 50 MCG/ACT nasal spray      furosemide (LASIX) 20 MG tablet Take 1 tablet (20 mg total) by mouth daily. 90 tablet 1   MELATONIN ER PO Take by mouth daily.     Multiple Vitamins-Minerals (MULTIVITAMIN PO) Take by mouth daily.  Oral Electrolytes (EMERGEN-C ELECTRO MIX) PACK Take by mouth.     potassium chloride (KLOR-CON) 10 MEQ tablet Take 1 tablet (10 mEq total) by mouth 2 (two) times daily. 120 tablet 2   Probiotic Product (PROBIOTIC DAILY PO) Take by mouth daily.     sertraline (ZOLOFT) 100 MG tablet TAKE ONE (1) TABLET EACH DAY 90 tablet 0   Turmeric 500 MG CAPS Take by mouth.     UNABLE TO FIND 2,000 mg by [facility] route 2 (two) times daily. Glucosamine     nitrofurantoin, macrocrystal-monohydrate, (MACROBID) 100 MG capsule Take 1 capsule (100 mg total) by mouth 2 (two) times daily. 1 po BId 14 capsule 0   No facility-administered medications prior to visit.    Allergies  Allergen Reactions   Latex    Lisinopril    Nsaids    Statins    Sulfa Antibiotics Hives    ROS Review of Systems  Constitutional: Negative.   HENT: Negative.    Eyes: Negative.   Respiratory: Negative.    Cardiovascular: Negative.   Gastrointestinal: Negative.   Genitourinary: Negative.   Skin: Negative.   Psychiatric/Behavioral: Negative.    All other systems reviewed and are negative.    Objective:    Physical Exam Vitals and nursing note reviewed.  Constitutional:      Appearance: Normal appearance. She is obese.  HENT:     Head: Normocephalic.     Right Ear: Ear canal and external ear normal. There is no impacted cerumen.     Left Ear: There is no impacted cerumen.     Nose: Nose normal. No congestion.     Mouth/Throat:     Mouth: Mucous membranes are moist.     Pharynx: Oropharynx is clear.  Eyes:     Conjunctiva/sclera: Conjunctivae normal.  Cardiovascular:     Rate and Rhythm: Normal rate and regular rhythm.     Pulses: Normal  pulses.     Heart sounds: Normal heart sounds.  Pulmonary:     Effort: Pulmonary effort is normal.     Breath sounds: Normal breath sounds.  Abdominal:     General: Bowel sounds are normal.     Tenderness: There is no right CVA tenderness or left CVA tenderness.  Skin:    General: Skin is warm.     Findings: No erythema or rash.  Neurological:     Mental Status: She is alert and oriented to person, place, and time.  Psychiatric:        Behavior: Behavior normal.    BP 122/81   Pulse 74   Temp (!) 97.4 F (36.3 C) (Temporal)   Ht 5\' 5"  (1.651 m)   Wt 266 lb (120.7 kg)   SpO2 96%   BMI 44.26 kg/m  Wt Readings from Last 3 Encounters:  01/26/21 266 lb (120.7 kg)  01/07/21 263 lb 6.4 oz (119.5 kg)  11/06/20 268 lb (121.6 kg)     Health Maintenance Due  Topic Date Due   COVID-19 Vaccine (4 - Booster for Pfizer series) 03/31/2020    There are no preventive care reminders to display for this patient.  No results found for: TSH Lab Results  Component Value Date   WBC 5.6 01/28/2020   HGB 14.0 01/28/2020   HCT 40.7 01/28/2020   MCV 94 01/28/2020   PLT 253 01/28/2020   Lab Results  Component Value Date   NA 139 07/10/2020   K 4.0 07/10/2020   CO2 30 07/10/2020  GLUCOSE 108 (H) 07/10/2020   BUN 19 07/10/2020   CREATININE 0.65 07/10/2020   BILITOT 0.3 12/19/2013   ALKPHOS 68 12/19/2013   AST 18 12/19/2013   ALT 16 12/19/2013   PROT 7.4 12/19/2013   ALBUMIN 4.1 12/19/2013   CALCIUM 8.9 07/10/2020   ANIONGAP 6 07/10/2020   Lab Results  Component Value Date   CHOL 227 (H) 01/28/2020   Lab Results  Component Value Date   HDL 46 01/28/2020   Lab Results  Component Value Date   LDLCALC 158 (H) 01/28/2020   Lab Results  Component Value Date   TRIG 127 01/28/2020   Lab Results  Component Value Date   CHOLHDL 4.9 (H) 01/28/2020   Lab Results  Component Value Date   HGBA1C 5.5 12/19/2013      Assessment & Plan:   Problem List Items Addressed  This Visit       Other   Annual physical exam - Primary    Completed head to toe assessment, no new concerns. Education provided to patient printed hand out given.   Labs completed- CBC, CMP, Lipid panel          Relevant Orders   CBC with Differential   Comprehensive metabolic panel   Lipid Panel   Other Visit Diagnoses     Need for immunization against influenza       Relevant Orders   Flu Vaccine QUAD High Dose(Fluad) (Completed)       No orders of the defined types were placed in this encounter.   Follow-up: Return in about 1 year (around 01/26/2022).    Ivy [name], NP

## 2021-01-26 NOTE — Patient Instructions (Signed)
Health Maintenance After Age 67 After age 67, you are at a higher risk for certain long-term diseases and infections as well as injuries from falls. Falls are a major cause of broken bones and head injuries in people who are older than age 67. Getting regular preventive care can help to keep you healthy and well. Preventive care includes getting regular testing and making lifestyle changes as recommended by your health care provider. Talk with your health care provider about: Which screenings and tests you should have. A screening is a test that checks for a disease when you have no symptoms. A diet and exercise plan that is right for you. What should I know about screenings and tests to prevent falls? Screening and testing are the best ways to find a health problem early. Early diagnosis and treatment give you the best chance of managing medical conditions that are common after age 67. Certain conditions and lifestyle choices may make you more likely to have a fall. Your health care provider may recommend: Regular vision checks. Poor vision and conditions such as cataracts can make you more likely to have a fall. If you wear glasses, make sure to get your prescription updated if your vision changes. Medicine review. Work with your health care provider to regularly review all of the medicines you are taking, including over-the-counter medicines. Ask your health care provider about any side effects that may make you more likely to have a fall. Tell your health care provider if any medicines that you take make you feel dizzy or sleepy. Osteoporosis screening. Osteoporosis is a condition that causes the bones to get weaker. This can make the bones weak and cause them to break more easily. Blood pressure screening. Blood pressure changes and medicines to control blood pressure can make you feel dizzy. Strength and balance checks. Your health care provider may recommend certain tests to check your strength and  balance while standing, walking, or changing positions. Foot health exam. Foot pain and numbness, as well as not wearing proper footwear, can make you more likely to have a fall. Depression screening. You may be more likely to have a fall if you have a fear of falling, feel emotionally low, or feel unable to do activities that you used to do. Alcohol use screening. Using too much alcohol can affect your balance and may make you more likely to have a fall. What actions can I take to lower my risk of falls? General instructions Talk with your health care provider about your risks for falling. Tell your health care provider if: You fall. Be sure to tell your health care provider about all falls, even ones that seem minor. You feel dizzy, sleepy, or off-balance. Take over-the-counter and prescription medicines only as told by your health care provider. These include any supplements. Eat a healthy diet and maintain a healthy weight. A healthy diet includes low-fat dairy products, low-fat (lean) meats, and fiber from whole grains, beans, and lots of fruits and vegetables. Home safety Remove any tripping hazards, such as rugs, cords, and clutter. Install safety equipment such as grab bars in bathrooms and safety rails on stairs. Keep rooms and walkways well-lit. Activity  Follow a regular exercise program to stay fit. This will help you maintain your balance. Ask your health care provider what types of exercise are appropriate for you. If you need a cane or walker, use it as recommended by your health care provider. Wear supportive shoes that have nonskid soles. Lifestyle Do not   drink alcohol if your health care provider tells you not to drink. If you drink alcohol, limit how much you have: 0-1 drink a day for women. 0-2 drinks a day for men. Be aware of how much alcohol is in your drink. In the [place] one drink equals one typical bottle of beer (12 oz), one-half glass of wine (5 oz), or one shot of  hard liquor (1 oz). Do not use any products that contain nicotine or tobacco, such as cigarettes and e-cigarettes. If you need help quitting, ask your health care provider. Summary Having a healthy lifestyle and getting preventive care can help to protect your health and wellness after age 67. Screening and testing are the best way to find a health problem early and help you avoid having a fall. Early diagnosis and treatment give you the best chance for managing medical conditions that are more common for people who are older than age 67. Falls are a major cause of broken bones and head injuries in people who are older than age 67. Take precautions to prevent a fall at home. Work with your health care provider to learn what changes you can make to improve your health and wellness and to prevent falls. This information is not intended to replace advice given to you by your health care provider. Make sure you discuss any questions you have with your health care provider. Document Revised: 06/05/2020 Document Reviewed: 03/13/2020 Elsevier Patient Education  2022 [employer]  

## 2021-01-29 ENCOUNTER — Other Ambulatory Visit: Payer: Self-pay

## 2021-01-29 ENCOUNTER — Other Ambulatory Visit: Payer: Medicare Other

## 2021-01-29 DIAGNOSIS — I119 Hypertensive heart disease without heart failure: Secondary | ICD-10-CM | POA: Diagnosis not present

## 2021-01-29 DIAGNOSIS — Z136 Encounter for screening for cardiovascular disorders: Secondary | ICD-10-CM | POA: Diagnosis not present

## 2021-01-29 DIAGNOSIS — Z0000 Encounter for general adult medical examination without abnormal findings: Secondary | ICD-10-CM

## 2021-01-29 DIAGNOSIS — K219 Gastro-esophageal reflux disease without esophagitis: Secondary | ICD-10-CM | POA: Diagnosis not present

## 2021-01-29 LAB — COMPREHENSIVE METABOLIC PANEL
ALT: 10 IU/L (ref 0–32)
AST: 14 IU/L (ref 0–40)
Albumin/Globulin Ratio: 1.4 (ref 1.2–2.2)
Albumin: 4 g/dL (ref 3.8–4.8)
Alkaline Phosphatase: 68 IU/L (ref 44–121)
BUN/Creatinine Ratio: 22 (ref 12–28)
BUN: 15 mg/dL (ref 8–27)
Bilirubin Total: 0.4 mg/dL (ref 0.0–1.2)
CO2: 26 mmol/L (ref 20–29)
Calcium: 9.2 mg/dL (ref 8.7–10.3)
Chloride: 100 mmol/L (ref 96–106)
Creatinine, Ser: 0.69 mg/dL (ref 0.57–1.00)
Globulin, Total: 2.8 g/dL (ref 1.5–4.5)
Glucose: 102 mg/dL — ABNORMAL HIGH (ref 70–99)
Potassium: 4.4 mmol/L (ref 3.5–5.2)
Sodium: 140 mmol/L (ref 134–144)
Total Protein: 6.8 g/dL (ref 6.0–8.5)
eGFR: 95 mL/min/{1.73_m2} (ref >59)

## 2021-01-29 LAB — CBC WITH DIFFERENTIAL/PLATELET
Basophils Absolute: 0.1 10*3/uL (ref 0.0–0.2)
Basos: 1 %
EOS (ABSOLUTE): 0.3 10*3/uL (ref 0.0–0.4)
Eos: 5 %
Hematocrit: 41.7 % (ref 34.0–46.6)
Hemoglobin: 14.3 g/dL (ref 11.1–15.9)
Immature Grans (Abs): 0 10*3/uL (ref 0.0–0.1)
Immature Granulocytes: 0 %
Lymphocytes Absolute: 2.6 10*3/uL (ref 0.7–3.1)
Lymphs: 39 %
MCH: 31.9 pg (ref 26.6–33.0)
MCHC: 34.3 g/dL (ref 31.5–35.7)
MCV: 93 fL (ref 79–97)
Monocytes Absolute: 0.5 10*3/uL (ref 0.1–0.9)
Monocytes: 8 %
Neutrophils Absolute: 3.2 10*3/uL (ref 1.4–7.0)
Neutrophils: 47 %
Platelets: 276 10*3/uL (ref 150–450)
RBC: 4.48 x10E6/uL (ref 3.77–5.28)
RDW: 12.1 % (ref 11.7–15.4)
WBC: 6.8 10*3/uL (ref 3.4–10.8)

## 2021-01-29 LAB — LIPID PANEL
Chol/HDL Ratio: 5 ratio — ABNORMAL HIGH (ref 0.0–4.4)
Cholesterol, Total: 216 mg/dL — ABNORMAL HIGH (ref 100–199)
HDL: 43 mg/dL (ref >39)
LDL Chol Calc (NIH): 142 mg/dL — ABNORMAL HIGH (ref 0–99)
Triglycerides: 171 mg/dL — ABNORMAL HIGH (ref 0–149)
VLDL Cholesterol Cal: 31 mg/dL (ref 5–40)

## 2021-04-15 ENCOUNTER — Other Ambulatory Visit: Payer: Self-pay | Admitting: Nurse Practitioner

## 2021-04-15 DIAGNOSIS — F331 Major depressive disorder, recurrent, moderate: Secondary | ICD-10-CM

## 2021-04-15 DIAGNOSIS — I119 Hypertensive heart disease without heart failure: Secondary | ICD-10-CM

## 2021-04-20 DIAGNOSIS — D045 Carcinoma in situ of skin of trunk: Secondary | ICD-10-CM | POA: Diagnosis not present

## 2021-04-20 DIAGNOSIS — L570 Actinic keratosis: Secondary | ICD-10-CM | POA: Diagnosis not present

## 2021-04-20 DIAGNOSIS — Z85828 Personal history of other malignant neoplasm of skin: Secondary | ICD-10-CM | POA: Diagnosis not present

## 2021-04-20 DIAGNOSIS — D485 Neoplasm of uncertain behavior of skin: Secondary | ICD-10-CM | POA: Diagnosis not present

## 2021-04-29 DIAGNOSIS — C44529 Squamous cell carcinoma of skin of other part of trunk: Secondary | ICD-10-CM | POA: Diagnosis not present

## 2021-05-19 ENCOUNTER — Encounter (HOSPITAL_COMMUNITY): Payer: Self-pay

## 2021-06-28 DIAGNOSIS — M1712 Unilateral primary osteoarthritis, left knee: Secondary | ICD-10-CM | POA: Diagnosis not present

## 2021-07-13 DIAGNOSIS — S92514A Nondisplaced fracture of proximal phalanx of right lesser toe(s), initial encounter for closed fracture: Secondary | ICD-10-CM | POA: Diagnosis not present

## 2021-07-26 DIAGNOSIS — S92344A Nondisplaced fracture of fourth metatarsal bone, right foot, initial encounter for closed fracture: Secondary | ICD-10-CM | POA: Diagnosis not present

## 2021-07-26 DIAGNOSIS — S92354A Nondisplaced fracture of fifth metatarsal bone, right foot, initial encounter for closed fracture: Secondary | ICD-10-CM | POA: Diagnosis not present

## 2021-07-29 DIAGNOSIS — Z09 Encounter for follow-up examination after completed treatment for conditions other than malignant neoplasm: Secondary | ICD-10-CM | POA: Diagnosis not present

## 2021-08-16 DIAGNOSIS — S92344D Nondisplaced fracture of fourth metatarsal bone, right foot, subsequent encounter for fracture with routine healing: Secondary | ICD-10-CM | POA: Diagnosis not present

## 2021-08-16 DIAGNOSIS — S92354D Nondisplaced fracture of fifth metatarsal bone, right foot, subsequent encounter for fracture with routine healing: Secondary | ICD-10-CM | POA: Diagnosis not present

## 2021-09-08 ENCOUNTER — Encounter (INDEPENDENT_AMBULATORY_CARE_PROVIDER_SITE_OTHER): Payer: Self-pay

## 2021-09-10 ENCOUNTER — Ambulatory Visit (INDEPENDENT_AMBULATORY_CARE_PROVIDER_SITE_OTHER): Payer: Medicare Other

## 2021-09-10 VITALS — Wt 248.0 lb

## 2021-09-10 DIAGNOSIS — Z0000 Encounter for general adult medical examination without abnormal findings: Secondary | ICD-10-CM

## 2021-09-10 NOTE — Progress Notes (Signed)
Subjective:   Bridget Conway is a 66 y.o. female who presents for Medicare Annual (Subsequent) preventive examination.  Virtual Visit via Telephone Note  I connected with  Bridget Conway on 09/10/21 at  2:45 PM EDT by telephone and verified that I am speaking with the correct person using two identifiers.  Location: Patient: Home Provider: [facility] Persons participating in the virtual visit: patient/Nurse Health Advisor   I discussed the limitations, risks, security and privacy concerns of performing an evaluation and management service by telephone and the availability of in person appointments. The patient expressed understanding and agreed to proceed.  Interactive audio and video telecommunications were attempted between this nurse and patient, however failed, due to patient having technical difficulties OR patient did not have access to video capability.  We continued and completed visit with audio only.  Some vital signs may be absent or patient reported.   Bridget Conway E Bridget Eutsler, LPN   Review of Systems     Cardiac Risk Factors include: advanced age (>66mn, >>58women);obesity (BMI >30kg/m2);dyslipidemia;hypertension;Other (see comment), Risk factor comments: mitral regurgitation, heart disease     Objective:    Today's Vitals   09/10/21 1447  Weight: 248 lb (112.5 kg)   Body mass index is 41.27 kg/m.     09/10/2021    2:52 PM 09/09/2020    3:12 PM 07/17/2020    7:39 AM 07/06/2020    2:36 PM 12/27/2019    8:41 AM 12/24/2019   10:29 AM 07/09/2013    1:57 PM  Advanced Directives  Does Patient Have a Medical Advance Directive? Yes Yes Yes Yes No No Patient does not have advance directive;Patient would not like information  Type of AScientist, forensicPower of ANewman GroveLiving will HKing SalmonLiving will HGlen CarbonLiving will HPortageLiving will     Copy of HHalmain Chart? No - copy requested  No - copy requested No - copy requested      Would patient like information on creating a medical advance directive?     Yes (MAU/Ambulatory/Procedural Areas - Information given) Yes (MAU/Ambulatory/Procedural Areas - Information given)     Current Medications (verified) Outpatient Encounter Medications as of 09/10/2021  Medication Sig   calcium carbonate (OSCAL) 1500 (600 Ca) MG TABS tablet Take by mouth.   Cholecalciferol 25 MCG (1000 UT) capsule Take by mouth.   fish oil-omega-3 fatty acids 1000 MG capsule Take 1,200 mg by mouth daily.   Flaxseed, Linseed, (FLAXSEED OIL PO) Take 2,400 mg by mouth daily.   fluticasone (FLONASE) 50 MCG/ACT nasal spray    furosemide (LASIX) 20 MG tablet TAKE ONE TABLET BY MOUTH TWICE DAILY   MELATONIN ER PO Take by mouth daily.   Multiple Vitamins-Minerals (MULTIVITAMIN PO) Take by mouth daily.   Oral Electrolytes (EMERGEN-C ELECTRO MIX) PACK Take by mouth.   potassium chloride (KLOR-CON) 10 MEQ tablet TAKE ONE (1) TABLET BY MOUTH TWO (2) TIMES DAILY   Probiotic Product (PROBIOTIC DAILY PO) Take by mouth daily.   sertraline (ZOLOFT) 100 MG tablet TAKE ONE (1) TABLET EACH DAY   Turmeric 500 MG CAPS Take by mouth.   UNABLE TO FIND 2,000 mg by [facility]og[facility] Hospitalroute 2 (two) times daily. Glucosamine   [DISCONTINUED] Flaxseed Oil (LINSEED OIL) OIL Take by mouth.   No facility-administered encounter medications on file as of 09/10/2021.    Allergies (verified) Latex, Lisinopril, Nsaids, Statins, and Sulfa antibiotics   History: Past Medical History:  Diagnosis Date   Allergy    Anxiety    Atypical ductal hyperplasia of right breast    Cancer (HCC)    basal cell CA- below eye   Depression    Family history of brain cancer    Family history of breast cancer    Family history of cervical cancer    Family history of colon cancer    Family history of lung cancer    Family history of melanoma    Family history of pancreatic cancer    Family history of uterine cancer     GERD (gastroesophageal reflux disease)    History of nonmelanoma skin cancer    Hypertension    no meds, has white coat syndrome   IFG (impaired fasting glucose) 2014   Osteoporosis    Wears glasses    Wears partial dentures    upper partial   Past Surgical History:  Procedure Laterality Date   ABDOMINAL HYSTERECTOMY     BREAST LUMPECTOMY WITH RADIOACTIVE SEED LOCALIZATION Right 12/27/2019   Procedure: RIGHT BREAST LUMPECTOMY WITH RADIOACTIVE SEED LOCALIZATION;  Surgeon: Jovita Kussmaul, MD;  Location: [place];  Service: General;  Laterality: Right;   CARPAL TUNNEL RELEASE Left 07/11/2013   Procedure: LEFT CARPAL TUNNEL RELEASE;  Surgeon: Tennis Must, MD;  Location: [place];  Service: Orthopedics;  Laterality: Left;   carpel tunnel  2005   right wrist   COLONOSCOPY     hysterectomy  2002   KNEE SURGERY Left    LAPAROSCOPIC GASTRIC BANDING  2010   TOTAL MASTECTOMY Bilateral 07/17/2020   Procedure: BILATERAL MASTECTOMY;  Surgeon: Jovita Kussmaul, MD;  Location: [place];  Service: General;  Laterality: Bilateral;   Family History  Problem Relation Age of Onset   Breast cancer Mother 11   Heart attack Father    Cervical cancer Sister 79   Uterine cancer Maternal Aunt 52   Melanoma Cousin 79       head (maternal first cousin)   Lung cancer Cousin 39       (paternal first cousin)   Colon cancer Cousin 8       (paternal first cousin)   Lung cancer Cousin 79       (paternal first cousin)   Colon cancer Cousin 81       (paternal first cousin)   Brain cancer Cousin 60       (paternal first cousin)   Lung cancer Cousin 55       (paternal first cousin)   Pancreatic cancer Cousin 53       (paternal first cousin)   Esophageal cancer Neg Hx    Rectal cancer Neg Hx    Stomach cancer Neg Hx    Social History   Socioeconomic History   Marital status: Significant Other    Spouse name: Not on file   Number of children: 1    Years of education: Not on file   Highest education level: Not on file  Occupational History   Occupation: retired  Tobacco Use   Smoking status: Former    Types: Cigarettes    Quit date: 07/09/2000    Years since quitting: 21.1   Smokeless tobacco: Never  Vaping Use   Vaping Use: Never used  Substance and Sexual Activity   Alcohol use: No   Drug use: No   Sexual activity: Not on file  Other Topics Concern   Not on file  Social  History Narrative   Lives with significant other   Double mastectomy in 07/2020 - Dr Marlou Starks   Routine visits with Dr Lindi Adie, Oncologist   Social Determinants of Health   Financial Resource Strain: Low Risk    Difficulty of Paying Living Expenses: Not hard at all  Food Insecurity: No Food Insecurity   Worried About Charity fundraiser in the Last Year: Never true   [place] in the Last Year: Never true  Transportation Needs: No Transportation Needs   Lack of Transportation (Medical): No   Lack of Transportation (Non-Medical): No  Physical Activity: Insufficiently Active   Days of Exercise per Week: 7 days   Minutes of Exercise per Session: 20 min  Stress: No Stress Concern Present   Feeling of Stress : Not at all  Social Connections: Moderately Integrated   Frequency of Communication with Friends and Family: More than three times a week   Frequency of Social Gatherings with Friends and Family: More than three times a week   Attends Religious Services: Never   Marine scientist or Organizations: Yes   Attends Music therapist: More than 4 times per year   Marital Status: Living with partner    Tobacco Counseling Counseling given: Not Answered   Clinical Intake:  Pre-visit preparation completed: Yes  Pain : No/denies pain     BMI - recorded: 41.27 Nutritional Status: BMI > 30  Obese Nutritional Risks: None Diabetes: No  How often do you need to have someone help you when you read instructions, pamphlets, or  other written materials from your doctor or pharmacy?: 1 - Never  Diabetic? no  Interpreter Needed?: No  Information entered by :: Fumi Guadron, LPN   Activities of Daily Living    09/10/2021    2:52 PM  In your present state of health, do you have any difficulty performing the following activities:  Hearing? 0  Vision? 0  Difficulty concentrating or making decisions? 0  Walking or climbing stairs? 0  Dressing or bathing? 0  Doing errands, shopping? 0  Preparing Food and eating ? N  Using the Toilet? N  In the past six months, have you accidently leaked urine? Y  Comment mild- urge incontinence  Do you have problems with loss of bowel control? N  Managing your Medications? N  Managing your Finances? N  Housekeeping or managing your Housekeeping? N    Patient Care Team: Ivy [name], NP as PCP - General (Nurse Practitioner) Nicholas Lose, MD as Consulting Physician (Hematology and Oncology) Sandford Craze, MD as Referring Physician (Dermatology)  Indicate any recent Medical Services you may have received from other than [facility] providers in the past year (date may be approximate).     Assessment:   This is a routine wellness examination for [facility].  Hearing/Vision screen Hearing Screening - Comments:: Denies hearing difficulties   Vision Screening - Comments:: Wears rx glasses - up to date with routine eye exams with [facility]  Dietary issues and exercise activities discussed: Current Exercise Habits: Home exercise routine, Type of exercise: walking;strength training/weights;stretching, Time (Minutes): 20, Frequency (Times/Week): 7, Weekly Exercise (Minutes/Week): 140, Intensity: Mild, Exercise limited by: orthopedic condition(s)   Goals Addressed             This Visit's Progress    Patient Stated   On track    She would like to lose weight so that she can get knee operation to be able to get around  better and not hurt       Depression Screen     09/10/2021    2:50 PM 01/26/2021   11:12 AM 11/06/2020    9:34 AM 09/09/2020    3:01 PM 01/27/2020   11:27 AM 12/19/2013    1:42 PM  PHQ 2/9 Scores  PHQ - 2 Score 0 0 0 1 1 0  PHQ- 9 Score   0  7     Fall Risk    09/10/2021    2:48 PM 11/06/2020    9:35 AM 09/09/2020    3:12 PM 01/27/2020   11:27 AM 12/19/2013    1:42 PM  Fall Risk   Falls in the past year? 0 0 0 0 No  Number falls in past yr: 0  0    Injury with Fall? 0  0    Risk for fall due to : No Fall Risks  Orthopedic patient;Medication side effect;Impaired vision    Follow up Falls prevention discussed  Education provided;Falls prevention discussed      FALL RISK PREVENTION PERTAINING TO THE HOME:  Any stairs in or around the home? No  If so, are there any without handrails? No  Home free of loose throw rugs in walkways, pet beds, electrical cords, etc? Yes  Adequate lighting in your home to reduce risk of falls? Yes   ASSISTIVE DEVICES UTILIZED TO PREVENT FALLS:  Life alert? No  Use of a cane, walker or w/c? No  Grab bars in the bathroom? Yes  Shower chair or bench in shower? Yes  Elevated toilet seat or a handicapped toilet? Yes   TIMED UP AND GO:  Was the test performed? No . Telephonic visit  Cognitive Function:        09/10/2021    2:56 PM 09/09/2020    3:13 PM  6CIT Screen  What Year? 0 points 0 points  What month? 0 points 0 points  What time? 0 points 0 points  Count back from 20 0 points 0 points  Months in reverse 0 points 0 points  Repeat phrase 2 points 0 points  Total Score 2 points 0 points    Immunizations Immunization History  Administered Date(s) Administered   Fluad Quad(high Dose 65+) 01/27/2020, 01/26/2021   Influenza Split 01/29/2013   Influenza, High Dose Seasonal PF 01/02/2019   Influenza-Unspecified 01/10/2015   PFIZER(Purple Top)SARS-COV-2 Vaccination 04/26/2019, 05/17/2019, 01/07/2020   PNEUMOCOCCAL CONJUGATE-20 01/26/2021   Pneumococcal Polysaccharide-23 12/03/2014   Tdap  06/05/2014   Zoster Recombinat (Shingrix) 02/07/2017, 10/30/2017   Zoster, Live 11/20/2012, 01/09/2013    TDAP status: Up to date  Flu Vaccine status: Up to date  Pneumococcal vaccine status: Up to date  Covid-19 vaccine status: Completed vaccines  Qualifies for Shingles Vaccine? Yes   Zostavax completed Yes   Shingrix Completed?: Yes  Screening Tests Health Maintenance  Topic Date Due   COVID-19 Vaccine (4 - Booster for Pfizer series) 03/03/2020   INFLUENZA VACCINE  11/09/2021   COLONOSCOPY (Pts 45-61yr Insurance coverage will need to be confirmed)  05/08/2022   DEXA SCAN  05/09/2024   TETANUS/TDAP  06/05/2024   Pneumonia Vaccine 68 Years old  Completed   Hepatitis C Screening  Completed   Zoster Vaccines- Shingrix  Completed   HPV VACCINES  Aged Out   FOOT EXAM  Discontinued   HEMOGLOBIN A1C  Discontinued   OPHTHALMOLOGY EXAM  Discontinued   URINE MICROALBUMIN  Discontinued    Health Maintenance  Health Maintenance Due  Topic Date Due   COVID-19 Vaccine (4 - Booster for Pfizer series) 03/03/2020    Colorectal cancer screening: Type of screening: Colonoscopy. Completed 05/08/2012. Repeat every 10 years  Mammogram status: No longer required due to bilateral mastectomy.  Bone Density status: Completed 05/10/2019. Results reflect: Bone density results: NORMAL. Repeat every 5 years.  Lung Cancer Screening: (Low Dose CT Chest recommended if Age 94-80 years, 30 pack-year currently smoking OR have quit w/in 15years.) does not qualify  Additional Screening:  Hepatitis C Screening: does qualify; Completed 01/28/2020  Vision Screening: Recommended annual ophthalmology exams for early detection of glaucoma and other disorders of the eye. Is the patient up to date with their annual eye exam?  Yes  Who is the provider or what is the name of the office in which the patient attends annual eye exams? [facility] If pt is not established with a provider, would they like  to be referred to a provider to establish care? No .   Dental Screening: Recommended annual dental exams for proper oral hygiene  Community Resource Referral / Chronic Care Management: CRR required this visit?  No   CCM required this visit?  No      Plan:     I have personally reviewed and noted the following in the patient's chart:   Medical and social history Use of alcohol, tobacco or illicit drugs  Current medications and supplements including opioid prescriptions.  Functional ability and status Nutritional status Physical activity Advanced directives List of other physicians Hospitalizations, surgeries, and ER visits in previous 12 months Vitals Screenings to include cognitive, depression, and falls Referrals and appointments  In addition, I have reviewed and discussed with patient certain preventive protocols, quality metrics, and best practice recommendations. A written personalized care plan for preventive services as well as general preventive health recommendations were provided to patient.     Sandrea Hammond, LPN   06/14/6292   Nurse Notes: None

## 2021-09-10 NOTE — Patient Instructions (Addendum)
Ms. Kosel , Thank you for taking time to come for your Medicare Wellness Visit. I appreciate your ongoing commitment to your health goals. Please review the following plan we discussed and let me know if I can assist you in the future.   Screening recommendations/referrals: Colonoscopy: Done 05/08/2012 - Repeat in 10 years *next year before we speak again! Mammogram: No longer required due to bilateral mastectomy 2022 Bone Density: Done 05/10/2019- Repeat in 5 years Recommended yearly ophthalmology/optometry visit for glaucoma screening and checkup Recommended yearly dental visit for hygiene and checkup  Vaccinations: Influenza vaccine: Done 01/26/2021 - Repeat annually Pneumococcal vaccine: Done  12/03/2014 & 01/26/2021    Tdap vaccine: Done 06/05/2014 - Repeat in 10 years Shingles vaccine: Done 02/07/2017 & 10/30/2017 Covid-19: Done  04/26/2019, 05/17/2019, & 01/07/2020    Advanced directives: Please bring a copy of your health care power of attorney and living will to the office to be added to your chart at your convenience.   Conditions/risks identified: Keep up the great work with [employer] and staying active! Good luck with your knee surgery  Next appointment: Follow up in one year for your annual wellness visit    Preventive Care 53 Years and Older, Female Preventive care refers to lifestyle choices and visits with your health care provider that can promote health and wellness. What does preventive care include? A yearly physical exam. This is also called an annual well check. Dental exams once or twice a year. Routine eye exams. Ask your health care provider how often you should have your eyes checked. Personal lifestyle choices, including: Daily care of your teeth and gums. Regular physical activity. Eating a healthy diet. Avoiding tobacco and drug use. Limiting alcohol use. Practicing safe sex. Taking low-dose aspirin every day. Taking vitamin and mineral supplements as  recommended by your health care provider. What happens during an annual well check? The services and screenings done by your health care provider during your annual well check will depend on your age, overall health, lifestyle risk factors, and family history of disease. Counseling  Your health care provider may ask you questions about your: Alcohol use. Tobacco use. Drug use. Emotional well-being. Home and relationship well-being. Sexual activity. Eating habits. History of falls. Memory and ability to understand (cognition). Work and work Statistician. Reproductive health. Screening  You may have the following tests or measurements: Height, weight, and BMI. Blood pressure. Lipid and cholesterol levels. These may be checked every 5 years, or more frequently if you are over 14 years old. Skin check. Lung cancer screening. You may have this screening every year starting at age 23 if you have a 30-pack-year history of smoking and currently smoke or have quit within the past 15 years. Fecal occult blood test (FOBT) of the stool. You may have this test every year starting at age 81. Flexible sigmoidoscopy or colonoscopy. You may have a sigmoidoscopy every 5 years or a colonoscopy every 10 years starting at age 26. Hepatitis C blood test. Hepatitis B blood test. Sexually transmitted disease (STD) testing. Diabetes screening. This is done by checking your blood sugar (glucose) after you have not eaten for a while (fasting). You may have this done every 1-3 years. Bone density scan. This is done to screen for osteoporosis. You may have this done starting at age 21. Mammogram. This may be done every 1-2 years. Talk to your health care provider about how often you should have regular mammograms. Talk with your health care provider about your  test results, treatment options, and if necessary, the need for more tests. Vaccines  Your health care provider may recommend certain vaccines, such  as: Influenza vaccine. This is recommended every year. Tetanus, diphtheria, and acellular pertussis (Tdap, Td) vaccine. You may need a Td booster every 10 years. Zoster vaccine. You may need this after age 76. Pneumococcal 13-valent conjugate (PCV13) vaccine. One dose is recommended after age 24. Pneumococcal polysaccharide (PPSV23) vaccine. One dose is recommended after age 20. Talk to your health care provider about which screenings and vaccines you need and how often you need them. This information is not intended to replace advice given to you by your health care provider. Make sure you discuss any questions you have with your health care provider. Document Released: 04/24/2015 Document Revised: 12/16/2015 Document Reviewed: 01/27/2015 Elsevier Interactive Patient Education  2017 [employer] Prevention in the Home Falls can cause injuries. They can happen to people of all ages. There are many things you can do to make your home safe and to help prevent falls. What can I do on the outside of my home? Regularly fix the edges of walkways and driveways and fix any cracks. Remove anything that might make you trip as you walk through a door, such as a raised step or threshold. Trim any bushes or trees on the path to your home. Use bright outdoor lighting. Clear any walking paths of anything that might make someone trip, such as rocks or tools. Regularly check to see if handrails are loose or broken. Make sure that both sides of any steps have handrails. Any raised decks and porches should have guardrails on the edges. Have any leaves, snow, or ice cleared regularly. Use sand or salt on walking paths during winter. Clean up any spills in your garage right away. This includes oil or grease spills. What can I do in the bathroom? Use night lights. Install grab bars by the toilet and in the tub and shower. Do not use towel bars as grab bars. Use non-skid mats or decals in the tub or  shower. If you need to sit down in the shower, use a plastic, non-slip stool. Keep the floor dry. Clean up any water that spills on the floor as soon as it happens. Remove soap buildup in the tub or shower regularly. Attach bath mats securely with double-sided non-slip rug tape. Do not have throw rugs and other things on the floor that can make you trip. What can I do in the bedroom? Use night lights. Make sure that you have a light by your bed that is easy to reach. Do not use any sheets or blankets that are too big for your bed. They should not hang down onto the floor. Have a firm chair that has side arms. You can use this for support while you get dressed. Do not have throw rugs and other things on the floor that can make you trip. What can I do in the kitchen? Clean up any spills right away. Avoid walking on wet floors. Keep items that you use a lot in easy-to-reach places. If you need to reach something above you, use a strong step stool that has a grab bar. Keep electrical cords out of the way. Do not use floor polish or wax that makes floors slippery. If you must use wax, use non-skid floor wax. Do not have throw rugs and other things on the floor that can make you trip. What can I do with my  stairs? Do not leave any items on the stairs. Make sure that there are handrails on both sides of the stairs and use them. Fix handrails that are broken or loose. Make sure that handrails are as long as the stairways. Check any carpeting to make sure that it is firmly attached to the stairs. Fix any carpet that is loose or worn. Avoid having throw rugs at the top or bottom of the stairs. If you do have throw rugs, attach them to the floor with carpet tape. Make sure that you have a light switch at the top of the stairs and the bottom of the stairs. If you do not have them, ask someone to add them for you. What else can I do to help prevent falls? Wear shoes that: Do not have high heels. Have  rubber bottoms. Are comfortable and fit you well. Are closed at the toe. Do not wear sandals. If you use a stepladder: Make sure that it is fully opened. Do not climb a closed stepladder. Make sure that both sides of the stepladder are locked into place. Ask someone to hold it for you, if possible. Clearly mark and make sure that you can see: Any grab bars or handrails. First and last steps. Where the edge of each step is. Use tools that help you move around (mobility aids) if they are needed. These include: Canes. Walkers. Scooters. Crutches. Turn on the lights when you go into a dark area. Replace any light bulbs as soon as they burn out. Set up your furniture so you have a clear path. Avoid moving your furniture around. If any of your floors are uneven, fix them. If there are any pets around you, be aware of where they are. Review your medicines with your doctor. Some medicines can make you feel dizzy. This can increase your chance of falling. Ask your doctor what other things that you can do to help prevent falls. This information is not intended to replace advice given to you by your health care provider. Make sure you discuss any questions you have with your health care provider. Document Released: 01/22/2009 Document Revised: 09/03/2015 Document Reviewed: 05/02/2014 Elsevier Interactive Patient Education  2017 [employer].

## 2021-09-30 ENCOUNTER — Other Ambulatory Visit: Payer: Self-pay | Admitting: Nurse Practitioner

## 2021-09-30 DIAGNOSIS — I119 Hypertensive heart disease without heart failure: Secondary | ICD-10-CM

## 2021-10-14 ENCOUNTER — Other Ambulatory Visit (HOSPITAL_COMMUNITY): Payer: Self-pay | Admitting: Surgery

## 2021-10-14 ENCOUNTER — Other Ambulatory Visit: Payer: Self-pay | Admitting: Surgery

## 2021-10-14 DIAGNOSIS — Z9884 Bariatric surgery status: Secondary | ICD-10-CM

## 2021-10-27 ENCOUNTER — Ambulatory Visit (HOSPITAL_COMMUNITY)
Admission: RE | Admit: 2021-10-27 | Discharge: 2021-10-27 | Disposition: A | Discharge status: Home / Self Care | Payer: Medicare Other | Source: Ambulatory Visit | Attending: Surgery | Admitting: Surgery

## 2021-10-27 ENCOUNTER — Other Ambulatory Visit (HOSPITAL_COMMUNITY): Payer: Self-pay | Admitting: Surgery

## 2021-10-27 DIAGNOSIS — L570 Actinic keratosis: Secondary | ICD-10-CM | POA: Diagnosis not present

## 2021-10-27 DIAGNOSIS — Z9884 Bariatric surgery status: Secondary | ICD-10-CM | POA: Insufficient documentation

## 2021-10-27 DIAGNOSIS — R1310 Dysphagia, unspecified: Secondary | ICD-10-CM | POA: Diagnosis not present

## 2021-11-03 IMAGING — MG DIGITAL SCREENING BILAT W/ TOMO W/ CAD
8 series · 8 of 24 positions shown · non-contrast
Comparison: PRIOR FILMS

CLINICAL DATA: Screening.

EXAM:
DIGITAL SCREENING BILATERAL MAMMOGRAM WITH TOMO AND CAD

[L CC synth-2D]
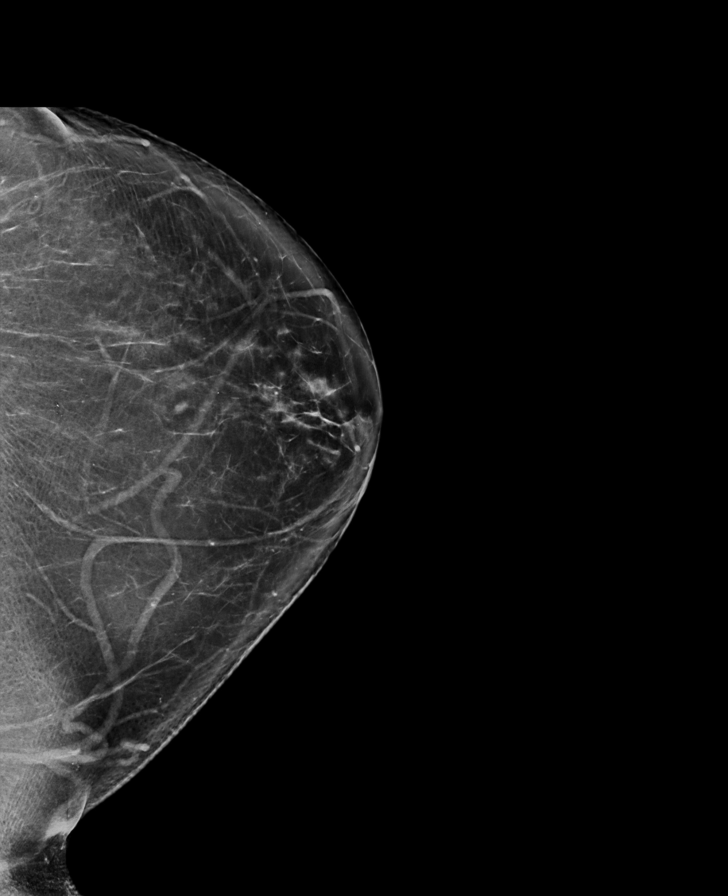

[L MLO synth-2D]
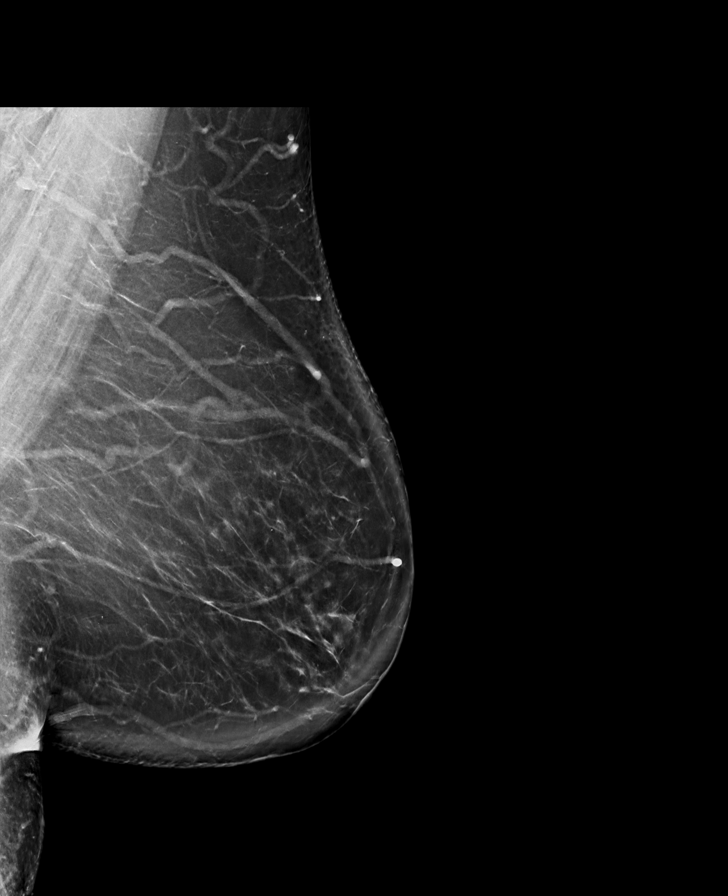

[R MLO synth-2D]
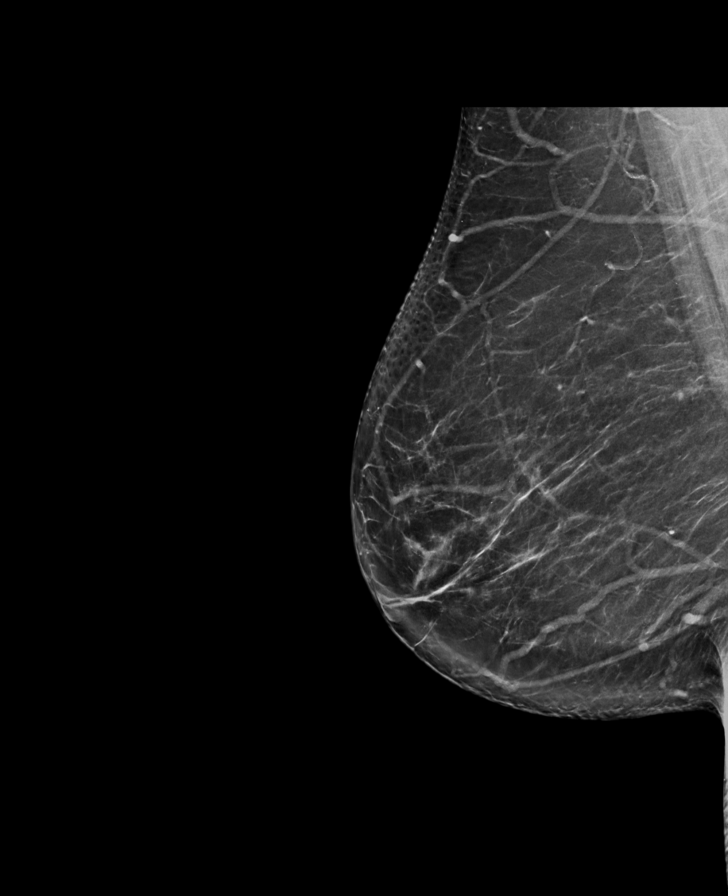

[R CC synth-2D]
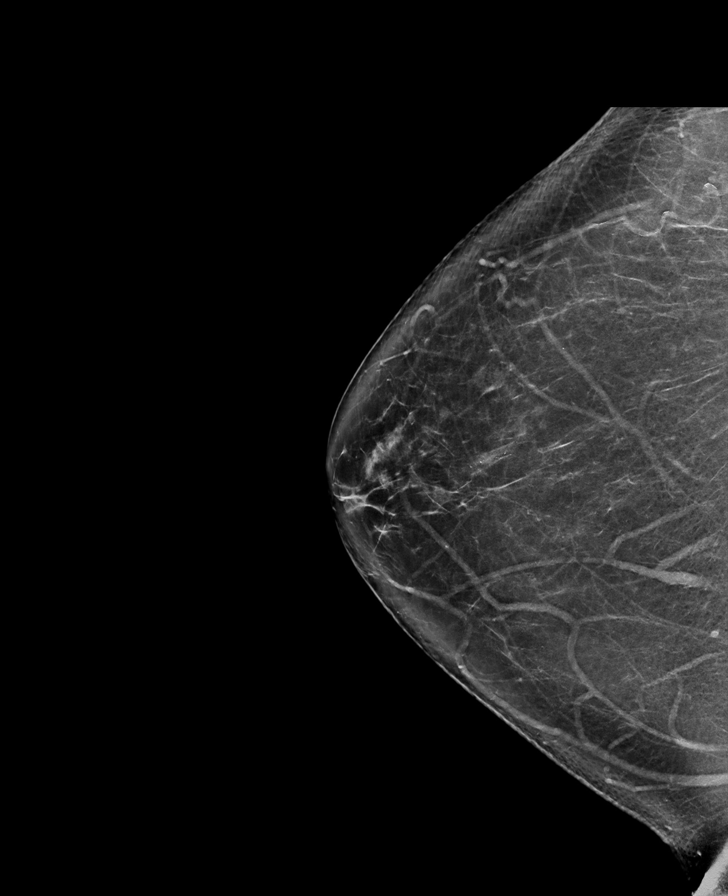

[R MLO tomo · tomo slice 43/84.0]
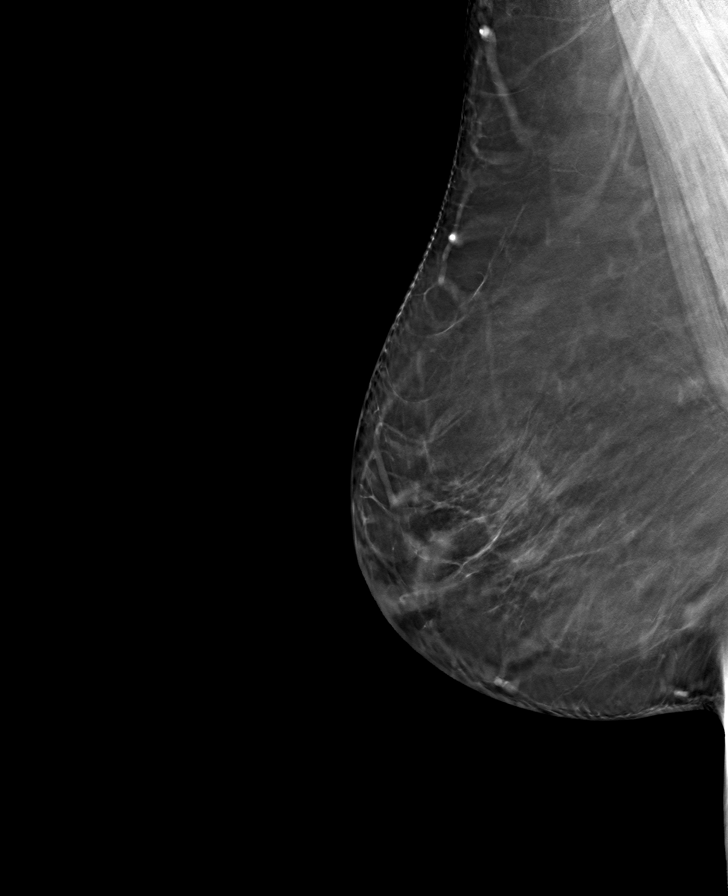

[R CC tomo · tomo slice 41/82.0]
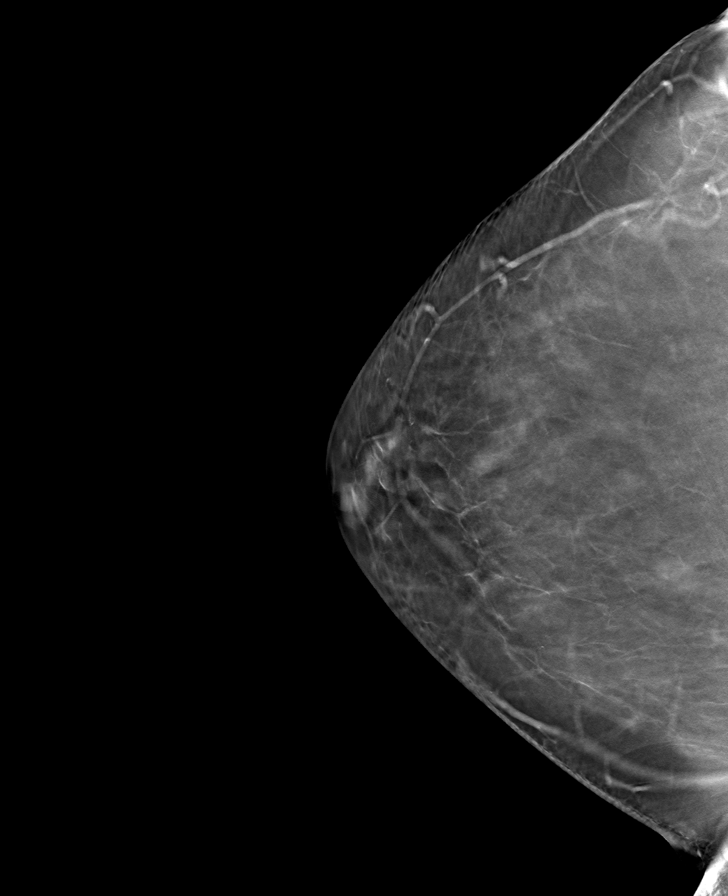

[L MLO tomo · tomo slice 48/95.0]
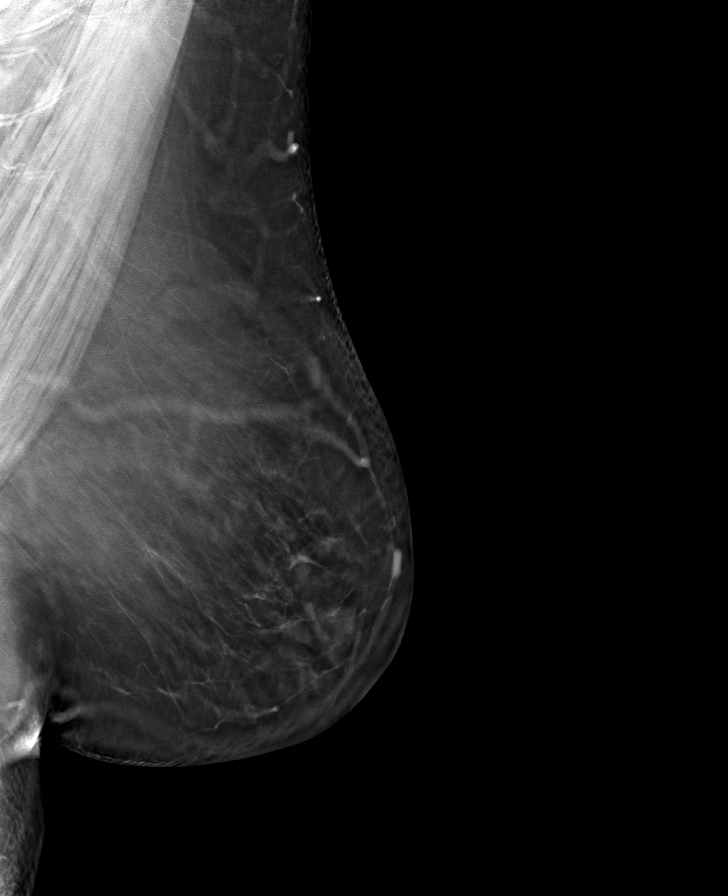

[L CC tomo · tomo slice 43/85.0]
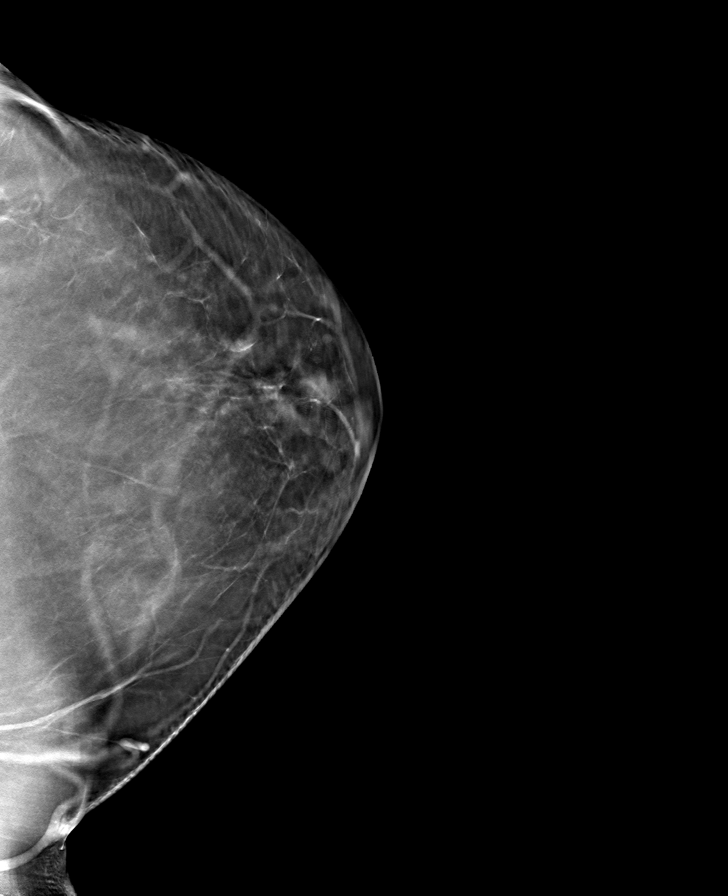

[8 of 24 positions shown; findings below may reference images not displayed]

ACR Breast Density Category b: There are scattered areas of
fibroglandular density.
FINDINGS: In the right breast, calcifications warrant further evaluation with
magnified views. In the left breast, no findings suspicious for
malignancy. Images were processed with CAD.
IMPRESSION: Further evaluation is suggested for calcifications in the right
breast.

RECOMMENDATION:
Diagnostic mammogram of the right breast. (Code:[id number])

The patient will be contacted regarding the findings, and additional
imaging will be scheduled.

BI-RADS CATEGORY  0: Incomplete. Need additional imaging evaluation
and/or prior mammograms for comparison.

## 2021-11-14 DIAGNOSIS — M25561 Pain in right knee: Secondary | ICD-10-CM | POA: Diagnosis not present

## 2021-11-25 DIAGNOSIS — M25561 Pain in right knee: Secondary | ICD-10-CM | POA: Diagnosis not present

## 2021-11-30 DIAGNOSIS — M9902 Segmental and somatic dysfunction of thoracic region: Secondary | ICD-10-CM | POA: Diagnosis not present

## 2021-11-30 DIAGNOSIS — M9901 Segmental and somatic dysfunction of cervical region: Secondary | ICD-10-CM | POA: Diagnosis not present

## 2021-11-30 DIAGNOSIS — M546 Pain in thoracic spine: Secondary | ICD-10-CM | POA: Diagnosis not present

## 2021-11-30 DIAGNOSIS — M47816 Spondylosis without myelopathy or radiculopathy, lumbar region: Secondary | ICD-10-CM | POA: Diagnosis not present

## 2021-11-30 DIAGNOSIS — M9903 Segmental and somatic dysfunction of lumbar region: Secondary | ICD-10-CM | POA: Diagnosis not present

## 2021-11-30 DIAGNOSIS — M542 Cervicalgia: Secondary | ICD-10-CM | POA: Diagnosis not present

## 2021-12-07 NOTE — Progress Notes (Signed)
Sent message, via epic in basket, requesting order in epic from surgeon     12/07/21 1530  Preop Orders  Has preop orders? No  Name of staff/physician contacted for orders(Indicate phone or IB message) P. Stechschulte, MD.

## 2021-12-08 ENCOUNTER — Ambulatory Visit: Payer: Self-pay | Admitting: Surgery

## 2021-12-14 NOTE — Patient Instructions (Signed)
SURGICAL WAITING ROOM VISITATION Patients having surgery or a procedure may have no more than 2 support people in the waiting area - these visitors may rotate.   Children under the age of 63 must have an adult with them who is not the patient. If the patient needs to stay at the hospital during part of their recovery, the visitor guidelines for inpatient rooms apply. Pre-op nurse will coordinate an appropriate time for 1 support person to accompany patient in pre-op.  This support person may not rotate.    Please refer to the [facility] website for the visitor guidelines for Inpatients (after your surgery is over and you are in a regular room).    Your procedure is scheduled on: 12/28/21   Report to [facility] Main Entrance    Report to admitting at 5:15 AM   Call this number if you have problems the morning of surgery [phone]   Do not eat food :After Midnight.   After Midnight you may have the following liquids until 4:30 AM DAY OF SURGERY  Water Non-Citrus Juices (without pulp, NO RED) Carbonated Beverages Black Coffee (NO MILK/CREAM OR CREAMERS, sugar ok)  Clear Tea (NO MILK/CREAM OR CREAMERS, sugar ok) regular and decaf                             Plain Jell-O (NO RED)                                           Fruit ices (not with fruit pulp, NO RED)                                     Popsicles (NO RED)                                                               Sports drinks like Gatorade (NO RED)  FOLLOW BOWEL PREP AND ANY ADDITIONAL PRE OP INSTRUCTIONS YOU RECEIVED FROM YOUR SURGEON'S OFFICE!!!     Oral Hygiene is also important to reduce your risk of infection.                                    Remember - BRUSH YOUR TEETH THE MORNING OF SURGERY WITH YOUR REGULAR TOOTHPASTE   Take these medicines the morning of surgery with A SIP OF WATER: Tylenol, Sertraline                              You may not have any metal on your body including hair pins,  jewelry, and body piercing             Do not wear make-up, lotions, powders, perfumes, or deodorant  Do not wear nail polish including gel and S&S, artificial/acrylic nails, or any other type of covering on natural nails including finger and toenails. If you have artificial nails, gel coating, etc. that needs to be  removed by a nail salon please have this removed prior to surgery or surgery may need to be canceled/ delayed if the surgeon/ anesthesia feels like they are unable to be safely monitored.   Do not shave  48 hours prior to surgery.    Do not bring valuables to the hospital. [place].   Contacts, dentures or bridgework may not be worn into surgery.   Bring small overnight bag day of surgery.   DO NOT [place]. PHARMACY WILL DISPENSE MEDICATIONS LISTED ON YOUR MEDICATION LIST TO YOU DURING YOUR ADMISSION [place]!    Patients discharged on the day of surgery will not be allowed to drive home.  Someone NEEDS to stay with you for the first 24 hours after anesthesia.   Special Instructions: Bring a copy of your healthcare power of attorney and living will documents         the day of surgery if you haven't scanned them before.              Please read over the following fact sheets you were given: IF YOU HAVE QUESTIONS ABOUT YOUR PRE-OP INSTRUCTIONS PLEASE CALL [place] - Preparing for Surgery Before surgery, you can play an important role.  Because skin is not sterile, your skin needs to be as free of germs as possible.  You can reduce the number of germs on your skin by washing with CHG (chlorahexidine gluconate) soap before surgery.  CHG is an antiseptic cleaner which kills germs and bonds with the skin to continue killing germs even after washing. Please DO NOT use if you have an allergy to CHG or antibacterial soaps.  If your skin becomes reddened/irritated stop  using the CHG and inform your nurse when you arrive at Short Stay. Do not shave (including legs and underarms) for at least 48 hours prior to the first CHG shower.  You may shave your face/neck.  Please follow these instructions carefully:  1.  Shower with CHG Soap the night before surgery and the  morning of surgery.  2.  If you choose to wash your hair, wash your hair first as usual with your normal  shampoo.  3.  After you shampoo, rinse your hair and body thoroughly to remove the shampoo.                             4.  Use CHG as you would any other liquid soap.  You can apply chg directly to the skin and wash.  Gently with a scrungie or clean washcloth.  5.  Apply the CHG Soap to your body ONLY FROM THE NECK DOWN.   Do   not use on face/ open                           Wound or open sores. Avoid contact with eyes, ears mouth and   genitals (private parts).                       Wash face,  Genitals (private parts) with your normal soap.             6.  Wash thoroughly, paying special attention to the area where  your    surgery  will be performed.  7.  Thoroughly rinse your body with warm water from the neck down.  8.  DO NOT shower/wash with your normal soap after using and rinsing off the CHG Soap.                9.  Pat yourself dry with a clean towel.            10.  Wear clean pajamas.            11.  Place clean sheets on your bed the night of your first shower and do not  sleep with pets. Day of Surgery : Do not apply any lotions/deodorants the morning of surgery.  Please wear clean clothes to the hospital/surgery center.  FAILURE TO FOLLOW THESE INSTRUCTIONS MAY RESULT IN THE CANCELLATION OF YOUR SURGERY  PATIENT SIGNATURE_________________________________  NURSE SIGNATURE__________________________________  ________________________________________________________________________

## 2021-12-14 NOTE — Progress Notes (Signed)
COVID Vaccine Completed: yes x3  Date of COVID positive in last 90 days:  PCP - Jac Canavan, NP Cardiologist -   Chest x-ray -  EKG -  Stress Test -  ECHO -  Cardiac Cath -  Pacemaker/ICD device last checked: Spinal Cord Stimulator:  Bowel Prep -   Sleep Study -  CPAP -   Fasting Blood Sugar - pre? Checks Blood Sugar _____ times a day  Blood Thinner Instructions: Aspirin Instructions: Last Dose:  Activity level:  Can go up a flight of stairs and perform activities of daily living without stopping and without symptoms of chest pain or shortness of breath.  Able to exercise without symptoms  Unable to go up a flight of stairs without symptoms of     Anesthesia review:   Patient denies shortness of breath, fever, cough and chest pain at PAT appointment  Patient verbalized understanding of instructions that were given to them at the PAT appointment. Patient was also instructed that they will need to review over the PAT instructions again at home before surgery.

## 2021-12-15 ENCOUNTER — Encounter (HOSPITAL_COMMUNITY)
Admission: RE | Admit: 2021-12-15 | Discharge: 2021-12-15 | Disposition: A | Discharge status: Home / Self Care | Payer: Medicare Other | Source: Ambulatory Visit | Attending: Surgery | Admitting: Surgery

## 2021-12-15 ENCOUNTER — Encounter (HOSPITAL_COMMUNITY): Payer: Self-pay

## 2021-12-15 VITALS — BP 154/94 | HR 64 | Temp 98.3°F | Resp 14 | Ht 65.0 in | Wt 248.0 lb

## 2021-12-15 DIAGNOSIS — I119 Hypertensive heart disease without heart failure: Secondary | ICD-10-CM | POA: Insufficient documentation

## 2021-12-15 DIAGNOSIS — R7309 Other abnormal glucose: Secondary | ICD-10-CM | POA: Diagnosis not present

## 2021-12-15 DIAGNOSIS — Z01818 Encounter for other preprocedural examination: Secondary | ICD-10-CM | POA: Diagnosis not present

## 2021-12-15 DIAGNOSIS — R7303 Prediabetes: Secondary | ICD-10-CM

## 2021-12-15 DIAGNOSIS — R7989 Other specified abnormal findings of blood chemistry: Secondary | ICD-10-CM | POA: Insufficient documentation

## 2021-12-15 HISTORY — DX: Personal history of urinary calculi: Z87.442

## 2021-12-15 HISTORY — DX: Unspecified osteoarthritis, unspecified site: M19.90

## 2021-12-15 HISTORY — DX: Prediabetes: R73.03

## 2021-12-15 LAB — GLUCOSE, CAPILLARY: Glucose-Capillary: 96 mg/dL (ref 70–99)

## 2021-12-15 LAB — CBC
HCT: 42.3 % (ref 36.0–46.0)
Hemoglobin: 13.9 g/dL (ref 12.0–15.0)
MCH: 32.5 pg (ref 26.0–34.0)
MCHC: 32.9 g/dL (ref 30.0–36.0)
MCV: 98.8 fL (ref 80.0–100.0)
Platelets: 232 10*3/uL (ref 150–400)
RBC: 4.28 MIL/uL (ref 3.87–5.11)
RDW: 12.5 % (ref 11.5–15.5)
WBC: 7.2 10*3/uL (ref 4.0–10.5)
nRBC: 0 % (ref 0.0–0.2)

## 2021-12-15 LAB — BASIC METABOLIC PANEL
Anion gap: 6 (ref 5–15)
BUN: 24 mg/dL — ABNORMAL HIGH (ref 8–23)
CO2: 29 mmol/L (ref 22–32)
Calcium: 8.7 mg/dL — ABNORMAL LOW (ref 8.9–10.3)
Chloride: 106 mmol/L (ref 98–111)
Creatinine, Ser: 0.67 mg/dL (ref 0.44–1.00)
GFR, Estimated: >60 mL/min (ref >60)
Glucose, Bld: 99 mg/dL (ref 70–99)
Potassium: 3.7 mmol/L (ref 3.5–5.1)
Sodium: 141 mmol/L (ref 135–145)

## 2021-12-15 LAB — HEMOGLOBIN A1C
Hgb A1c MFr Bld: 5.4 % (ref 4.8–5.6)
Mean Plasma Glucose: 108.28 mg/dL

## 2021-12-21 DIAGNOSIS — M9903 Segmental and somatic dysfunction of lumbar region: Secondary | ICD-10-CM | POA: Diagnosis not present

## 2021-12-21 DIAGNOSIS — M546 Pain in thoracic spine: Secondary | ICD-10-CM | POA: Diagnosis not present

## 2021-12-21 DIAGNOSIS — M47816 Spondylosis without myelopathy or radiculopathy, lumbar region: Secondary | ICD-10-CM | POA: Diagnosis not present

## 2021-12-21 DIAGNOSIS — M9902 Segmental and somatic dysfunction of thoracic region: Secondary | ICD-10-CM | POA: Diagnosis not present

## 2021-12-21 DIAGNOSIS — M542 Cervicalgia: Secondary | ICD-10-CM | POA: Diagnosis not present

## 2021-12-21 DIAGNOSIS — M9901 Segmental and somatic dysfunction of cervical region: Secondary | ICD-10-CM | POA: Diagnosis not present

## 2021-12-28 ENCOUNTER — Other Ambulatory Visit: Payer: Self-pay

## 2021-12-28 ENCOUNTER — Encounter (HOSPITAL_COMMUNITY): Payer: Self-pay | Admitting: Surgery

## 2021-12-28 ENCOUNTER — Inpatient Hospital Stay (HOSPITAL_COMMUNITY): Payer: Medicare Other | Admitting: Certified Registered Nurse Anesthetist

## 2021-12-28 ENCOUNTER — Ambulatory Visit (HOSPITAL_COMMUNITY)
Admission: RE | Admit: 2021-12-28 | Discharge: 2021-12-28 | Disposition: A | Discharge status: Home / Self Care | Payer: Medicare Other | Source: Ambulatory Visit | Attending: Surgery | Admitting: Surgery

## 2021-12-28 ENCOUNTER — Encounter (HOSPITAL_COMMUNITY)
Admission: RE | Disposition: A | Discharge status: Home / Self Care | Payer: Self-pay | Source: Ambulatory Visit | Attending: Surgery

## 2021-12-28 DIAGNOSIS — K9509 Other complications of gastric band procedure: Secondary | ICD-10-CM | POA: Insufficient documentation

## 2021-12-28 DIAGNOSIS — I10 Essential (primary) hypertension: Secondary | ICD-10-CM

## 2021-12-28 DIAGNOSIS — E119 Type 2 diabetes mellitus without complications: Secondary | ICD-10-CM | POA: Diagnosis not present

## 2021-12-28 DIAGNOSIS — K219 Gastro-esophageal reflux disease without esophagitis: Secondary | ICD-10-CM | POA: Diagnosis not present

## 2021-12-28 DIAGNOSIS — F419 Anxiety disorder, unspecified: Secondary | ICD-10-CM | POA: Diagnosis not present

## 2021-12-28 DIAGNOSIS — R1319 Other dysphagia: Secondary | ICD-10-CM | POA: Insufficient documentation

## 2021-12-28 DIAGNOSIS — Z6841 Body Mass Index (BMI) 40.0 and over, adult: Secondary | ICD-10-CM | POA: Diagnosis not present

## 2021-12-28 DIAGNOSIS — F418 Other specified anxiety disorders: Secondary | ICD-10-CM | POA: Diagnosis not present

## 2021-12-28 DIAGNOSIS — M1990 Unspecified osteoarthritis, unspecified site: Secondary | ICD-10-CM | POA: Insufficient documentation

## 2021-12-28 DIAGNOSIS — Z87891 Personal history of nicotine dependence: Secondary | ICD-10-CM | POA: Diagnosis not present

## 2021-12-28 DIAGNOSIS — T85898A Other specified complication of other internal prosthetic devices, implants and grafts, initial encounter: Secondary | ICD-10-CM | POA: Diagnosis not present

## 2021-12-28 DIAGNOSIS — F32A Depression, unspecified: Secondary | ICD-10-CM | POA: Diagnosis not present

## 2021-12-28 DIAGNOSIS — R1310 Dysphagia, unspecified: Secondary | ICD-10-CM | POA: Diagnosis not present

## 2021-12-28 HISTORY — PX: LAPAROSCOPIC GASTRIC RESECTION: SHX1936

## 2021-12-28 LAB — GLUCOSE, CAPILLARY: Glucose-Capillary: 91 mg/dL (ref 70–99)

## 2021-12-28 SURGERY — LAPAROSCOPIC GASTRIC RESECTION
Anesthesia: General

## 2021-12-28 MED ORDER — HYDROMORPHONE HCL 1 MG/ML IJ SOLN
INTRAMUSCULAR | Status: AC
  Filled 2021-12-28: qty 1

## 2021-12-28 MED ORDER — PROPOFOL 10 MG/ML IV BOLUS
INTRAVENOUS | Status: DC | PRN
  Administered 2021-12-28: 130 mg via INTRAVENOUS

## 2021-12-28 MED ORDER — EPHEDRINE SULFATE-NACL 50-0.9 MG/10ML-% IV SOSY
PREFILLED_SYRINGE | INTRAVENOUS | Status: DC | PRN
  Administered 2021-12-28: 5 mg via INTRAVENOUS

## 2021-12-28 MED ORDER — GABAPENTIN 300 MG PO CAPS
300.0000 mg | ORAL_CAPSULE | ORAL | Status: AC
  Administered 2021-12-28: 300 mg via ORAL
  Filled 2021-12-28: qty 1

## 2021-12-28 MED ORDER — 0.9 % SODIUM CHLORIDE (POUR BTL) OPTIME
TOPICAL | Status: DC | PRN
  Administered 2021-12-28: 1000 mL

## 2021-12-28 MED ORDER — CEFAZOLIN SODIUM-DEXTROSE 2-4 GM/100ML-% IV SOLN
2.0000 g | INTRAVENOUS | Status: AC
  Administered 2021-12-28: 2 g via INTRAVENOUS
  Filled 2021-12-28: qty 100

## 2021-12-28 MED ORDER — ORAL CARE MOUTH RINSE: 15.0000 mL | Freq: Once | OROMUCOSAL | Status: AC

## 2021-12-28 MED ORDER — AMISULPRIDE (ANTIEMETIC) 5 MG/2ML IV SOLN: 10.0000 mg | Freq: Once | INTRAVENOUS | Status: DC | PRN

## 2021-12-28 MED ORDER — DEXAMETHASONE SODIUM PHOSPHATE 10 MG/ML IJ SOLN
INTRAMUSCULAR | Status: AC
  Filled 2021-12-28: qty 1

## 2021-12-28 MED ORDER — DEXAMETHASONE SODIUM PHOSPHATE 10 MG/ML IJ SOLN
INTRAMUSCULAR | Status: DC | PRN
  Administered 2021-12-28: 10 mg via INTRAVENOUS

## 2021-12-28 MED ORDER — FENTANYL CITRATE (PF) 100 MCG/2ML IJ SOLN
INTRAMUSCULAR | Status: AC
  Filled 2021-12-28: qty 2

## 2021-12-28 MED ORDER — BUPIVACAINE-EPINEPHRINE (PF) 0.25% -1:200000 IJ SOLN
INTRAMUSCULAR | Status: AC
  Filled 2021-12-28: qty 30

## 2021-12-28 MED ORDER — MIDAZOLAM HCL 2 MG/2ML IJ SOLN
INTRAMUSCULAR | Status: AC
  Filled 2021-12-28: qty 2

## 2021-12-28 MED ORDER — LIDOCAINE HCL 2 % IJ SOLN
INTRAMUSCULAR | Status: AC
  Filled 2021-12-28: qty 20

## 2021-12-28 MED ORDER — ROCURONIUM BROMIDE 10 MG/ML (PF) SYRINGE
PREFILLED_SYRINGE | INTRAVENOUS | Status: AC
  Filled 2021-12-28: qty 10

## 2021-12-28 MED ORDER — ROCURONIUM BROMIDE 10 MG/ML (PF) SYRINGE
PREFILLED_SYRINGE | INTRAVENOUS | Status: DC | PRN
  Administered 2021-12-28: 100 mg via INTRAVENOUS

## 2021-12-28 MED ORDER — LACTATED RINGERS IV SOLN: INTRAVENOUS | Status: DC

## 2021-12-28 MED ORDER — LIDOCAINE HCL (PF) 2 % IJ SOLN
INTRAMUSCULAR | Status: DC | PRN
  Administered 2021-12-28: 1 mg/kg/h via INTRADERMAL

## 2021-12-28 MED ORDER — LIDOCAINE 2% (20 MG/ML) 5 ML SYRINGE
INTRAMUSCULAR | Status: DC | PRN
  Administered 2021-12-28: 60 mg via INTRAVENOUS

## 2021-12-28 MED ORDER — FENTANYL CITRATE (PF) 100 MCG/2ML IJ SOLN
INTRAMUSCULAR | Status: DC | PRN
  Administered 2021-12-28: 100 ug via INTRAVENOUS

## 2021-12-28 MED ORDER — ACETAMINOPHEN 500 MG PO TABS
1000.0000 mg | ORAL_TABLET | ORAL | Status: AC
  Administered 2021-12-28: 1000 mg via ORAL
  Filled 2021-12-28: qty 2

## 2021-12-28 MED ORDER — CHLORHEXIDINE GLUCONATE 0.12 % MT SOLN
15.0000 mL | Freq: Once | OROMUCOSAL | Status: AC
  Administered 2021-12-28: 15 mL via OROMUCOSAL

## 2021-12-28 MED ORDER — HEPARIN SODIUM (PORCINE) 5000 UNIT/ML IJ SOLN
5000.0000 [IU] | Freq: Once | INTRAMUSCULAR | Status: AC
  Administered 2021-12-28: 5000 [IU] via SUBCUTANEOUS
  Filled 2021-12-28: qty 1

## 2021-12-28 MED ORDER — SUGAMMADEX SODIUM 200 MG/2ML IV SOLN
INTRAVENOUS | Status: DC | PRN
  Administered 2021-12-28: 200 mg via INTRAVENOUS

## 2021-12-28 MED ORDER — BUPIVACAINE LIPOSOME 1.3 % IJ SUSP: 20.0000 mL | Freq: Once | INTRAMUSCULAR | Status: DC

## 2021-12-28 MED ORDER — HYDROMORPHONE HCL 1 MG/ML IJ SOLN
0.2500 mg | INTRAMUSCULAR | Status: DC | PRN
  Administered 2021-12-28: 0.5 mg via INTRAVENOUS
  Administered 2021-12-28: 0.25 mg via INTRAVENOUS

## 2021-12-28 MED ORDER — OXYCODONE HCL 5 MG/5ML PO SOLN: 5.0000 mg | Freq: Once | ORAL | Status: DC | PRN

## 2021-12-28 MED ORDER — ONDANSETRON HCL 4 MG/2ML IJ SOLN
INTRAMUSCULAR | Status: AC
  Filled 2021-12-28: qty 2

## 2021-12-28 MED ORDER — ONDANSETRON HCL 4 MG/2ML IJ SOLN
INTRAMUSCULAR | Status: DC | PRN
  Administered 2021-12-28: 4 mg via INTRAVENOUS

## 2021-12-28 MED ORDER — OXYCODONE-ACETAMINOPHEN 5-325 MG PO TABS: 1.0000 | ORAL_TABLET | ORAL | 0 refills | Status: DC | PRN

## 2021-12-28 MED ORDER — PROMETHAZINE HCL 25 MG/ML IJ SOLN: 6.2500 mg | INTRAMUSCULAR | Status: DC | PRN

## 2021-12-28 MED ORDER — CHLORHEXIDINE GLUCONATE CLOTH 2 % EX PADS: 6.0000 | MEDICATED_PAD | Freq: Once | CUTANEOUS | Status: DC

## 2021-12-28 MED ORDER — MIDAZOLAM HCL 5 MG/5ML IJ SOLN
INTRAMUSCULAR | Status: DC | PRN
  Administered 2021-12-28: 2 mg via INTRAVENOUS

## 2021-12-28 MED ORDER — PROPOFOL 10 MG/ML IV BOLUS
INTRAVENOUS | Status: AC
  Filled 2021-12-28: qty 20

## 2021-12-28 MED ORDER — OXYCODONE HCL 5 MG PO TABS: 5.0000 mg | ORAL_TABLET | Freq: Once | ORAL | Status: DC | PRN

## 2021-12-28 MED ORDER — MEPERIDINE HCL 50 MG/ML IJ SOLN: 6.2500 mg | INTRAMUSCULAR | Status: DC | PRN

## 2021-12-28 MED ORDER — BUPIVACAINE-EPINEPHRINE (PF) 0.25% -1:200000 IJ SOLN
INTRAMUSCULAR | Status: DC | PRN
  Administered 2021-12-28: 30 mL

## 2021-12-28 SURGICAL SUPPLY — 44 items
ADH SKN CLS APL DERMABOND .7 (GAUZE/BANDAGES/DRESSINGS) ×1
APL PRP STRL LF DISP 70% ISPRP (MISCELLANEOUS) ×2
BAG COUNTER SPONGE SURGICOUNT (BAG) IMPLANT
BAG SPNG CNTER NS LX DISP (BAG)
BLADE SURG 15 STRL LF DISP TIS (BLADE) ×2 IMPLANT
BLADE SURG 15 STRL SS (BLADE) ×1
CABLE HIGH FREQUENCY MONO STRZ (ELECTRODE) IMPLANT
CHLORAPREP W/TINT 26 (MISCELLANEOUS) ×2 IMPLANT
DERMABOND ADVANCED .7 DNX12 (GAUZE/BANDAGES/DRESSINGS) ×2 IMPLANT
DRAPE UTILITY 15X26 TOWEL STRL (DRAPES) ×4 IMPLANT
ELECT PENCIL ROCKER SW 15FT (MISCELLANEOUS) ×2 IMPLANT
ELECT REM PT RETURN 15FT ADLT (MISCELLANEOUS) ×2 IMPLANT
GLOVE BIOGEL PI IND STRL 7.0 (GLOVE) ×2 IMPLANT
GLOVE SURG LX STRL 8.0 MICRO (GLOVE) ×2 IMPLANT
GOWN STRL REUS W/ TWL XL LVL3 (GOWN DISPOSABLE) ×6 IMPLANT
GOWN STRL REUS W/TWL XL LVL3 (GOWN DISPOSABLE) ×3
GRASPER SUT TROCAR 14GX15 (MISCELLANEOUS) IMPLANT
IRRIG SUCT STRYKERFLOW 2 WTIP (MISCELLANEOUS)
IRRIGATION SUCT STRKRFLW 2 WTP (MISCELLANEOUS) IMPLANT
KIT BASIN OR (CUSTOM PROCEDURE TRAY) ×2 IMPLANT
KIT TURNOVER KIT A (KITS) IMPLANT
L-HOOK LAP DISP 36CM (ELECTROSURGICAL) ×1
LHOOK LAP DISP 36CM (ELECTROSURGICAL) ×2 IMPLANT
NDL SPNL 22GX3.5 QUINCKE BK (NEEDLE) ×2 IMPLANT
NEEDLE SPNL 22GX3.5 QUINCKE BK (NEEDLE) ×1 IMPLANT
NS IRRIG 1000ML POUR BTL (IV SOLUTION) ×2 IMPLANT
PACK UNIVERSAL I (CUSTOM PROCEDURE TRAY) ×2 IMPLANT
SCISSORS LAP 5X45 EPIX DISP (ENDOMECHANICALS) ×2 IMPLANT
SET TUBE SMOKE EVAC HIGH FLOW (TUBING) ×2 IMPLANT
SHEARS HARMONIC ACE PLUS 45CM (MISCELLANEOUS) IMPLANT
SLEEVE ADV FIXATION 5X100MM (TROCAR) ×4 IMPLANT
SOL ANTI FOG 6CC (MISCELLANEOUS) ×2 IMPLANT
SOLUTION ANTI FOG 6CC (MISCELLANEOUS) ×1
SPIKE FLUID TRANSFER (MISCELLANEOUS) ×4 IMPLANT
SUT MNCRL AB 4-0 PS2 18 (SUTURE) ×2 IMPLANT
SUT VIC AB 2-0 SH 27 (SUTURE) ×1
SUT VIC AB 2-0 SH 27X BRD (SUTURE) ×2 IMPLANT
SUT VIC AB 4-0 SH 18 (SUTURE) ×2 IMPLANT
SYR 20ML LL LF (SYRINGE) ×2 IMPLANT
SYS KII OPTICAL ACCESS 15MM (TROCAR) ×1
SYSTEM KII OPTICAL ACCESS 15MM (TROCAR) ×2 IMPLANT
TOWEL OR 17X26 10 PK STRL BLUE (TOWEL DISPOSABLE) ×2 IMPLANT
TOWEL OR NON WOVEN STRL DISP B (DISPOSABLE) ×2 IMPLANT
TROCAR Z-THREAD OPTICAL 5X100M (TROCAR) ×2 IMPLANT

## 2021-12-28 NOTE — H&P (Signed)
Admitting Physician: Nickola Major Jocee Kissick  Service: Bariatric surgery  CC: Lap band malfunction  Subjective   HPI: Bridget Conway is an 66 y.o. female who is here for lap band removal  Past Medical History:  Diagnosis Date   Allergy    Anxiety    Arthritis    Atypical ductal hyperplasia of right breast    Cancer ([place])    basal cell CA- below eye   Depression    Family history of brain cancer    Family history of breast cancer    Family history of cervical cancer    Family history of colon cancer    Family history of lung cancer    Family history of melanoma    Family history of pancreatic cancer    Family history of uterine cancer    GERD (gastroesophageal reflux disease)    History of kidney stones    History of nonmelanoma skin cancer    Hypertension    no meds, has white coat syndrome   IFG (impaired fasting glucose) 2014   Osteoporosis    Pre-diabetes    Wears glasses    Wears partial dentures    upper partial    Past Surgical History:  Procedure Laterality Date   ABDOMINAL HYSTERECTOMY     BREAST LUMPECTOMY WITH RADIOACTIVE SEED LOCALIZATION Right 12/27/2019   Procedure: RIGHT BREAST LUMPECTOMY WITH RADIOACTIVE SEED LOCALIZATION;  Surgeon: Jovita Kussmaul, MD;  Location: [place];  Service: General;  Laterality: Right;   CARPAL TUNNEL RELEASE Left 07/11/2013   Procedure: LEFT CARPAL TUNNEL RELEASE;  Surgeon: Tennis Must, MD;  Location: [place];  Service: Orthopedics;  Laterality: Left;   carpel tunnel  2005   right wrist   COLONOSCOPY     hysterectomy  2002   KNEE SURGERY Left    LAPAROSCOPIC GASTRIC BANDING  2010   TOTAL MASTECTOMY Bilateral 07/17/2020   Procedure: BILATERAL MASTECTOMY;  Surgeon: Jovita Kussmaul, MD;  Location: [place];  Service: General;  Laterality: Bilateral;    Family History  Problem Relation Age of Onset   Breast cancer Mother 27   Heart attack Father    Cervical cancer  Sister 69   Uterine cancer Maternal Aunt 63   Melanoma Cousin 37       head (maternal first cousin)   Lung cancer Cousin 40       (paternal first cousin)   Colon cancer Cousin 17       (paternal first cousin)   Lung cancer Cousin 33       (paternal first cousin)   Colon cancer Cousin 66       (paternal first cousin)   Brain cancer Cousin 43       (paternal first cousin)   Lung cancer Cousin 29       (paternal first cousin)   Pancreatic cancer Cousin 32       (paternal first cousin)   Esophageal cancer Neg Hx    Rectal cancer Neg Hx    Stomach cancer Neg Hx     Social:  reports that she quit smoking about 21 years ago. Her smoking use included cigarettes. She has never used smokeless tobacco. She reports that she does not drink alcohol and does not use drugs.  Allergies:  Allergies  Allergen Reactions   Lisinopril Cough   Nsaids     Fluid retention    Statins     Muscle pain  Sulfa Antibiotics Hives   Latex Rash    Medications: Current Outpatient Medications  Medication Instructions   acetaminophen (TYLENOL) 1,300 mg, Oral, Every 8 hours PRN   Calcium Carb-Cholecalciferol (CALCIUM 600 + D PO) 1 tablet, Oral, Daily   diphenhydrAMINE (BENADRYL) 25 mg, Oral, Every 6 hours PRN   diphenhydrAMINE-zinc acetate (BENADRYL) cream 1 Application, Topical, 3 times daily PRN   fluticasone (FLONASE) 50 MCG/ACT nasal spray 1 spray, Each Nare, Daily PRN   furosemide (LASIX) 20 MG tablet TAKE ONE TABLET BY MOUTH TWICE DAILY   GLUCOSAMINE-VITAMIN D PO 2 tablets, Oral, Daily   Melatonin 10 mg, Oral, At bedtime PRN   Multiple Vitamins-Minerals (MULTIVITAMIN PO) 1 tablet, Oral, Daily   Omega 3-6-9 Fatty Acids (TRIPLE OMEGA-3-6-9) CAPS 2 capsules, Oral, Daily   potassium chloride (KLOR-CON) 10 MEQ tablet TAKE ONE (1) TABLET BY MOUTH TWO (2) TIMES DAILY   Probiotic Product (PROBIOTIC DAILY PO) 1 capsule, Oral, Daily   sertraline (ZOLOFT) 100 MG tablet TAKE ONE (1) TABLET EACH DAY    TURMERIC PO 1,200 mg, Oral, Daily    ROS - all of the below systems have been reviewed with the patient and positives are indicated with bold text General: chills, fever or night sweats Eyes: blurry vision or double vision ENT: epistaxis or sore throat Allergy/Immunology: itchy/watery eyes or nasal congestion Hematologic/Lymphatic: bleeding problems, blood clots or swollen lymph nodes Endocrine: temperature intolerance or unexpected weight changes Breast: new or changing breast lumps or nipple discharge Resp: cough, shortness of breath, or wheezing CV: chest pain or dyspnea on exertion GI: as per HPI GU: dysuria, trouble voiding, or hematuria MSK: joint pain or joint stiffness Neuro: TIA or stroke symptoms Derm: pruritus and skin lesion changes Psych: anxiety and depression  Objective   PE Blood pressure (!) 162/84, pulse 65, temperature 98.3 F (36.8 C), temperature source Oral, resp. rate 15, height '5\' 5"'$  (1.651 m), weight 112.5 kg, SpO2 99 %. Constitutional: NAD; conversant; no deformities Eyes: Moist conjunctiva; no lid lag; anicteric; PERRL Neck: Trachea midline; no thyromegaly Lungs: Normal respiratory effort; no tactile fremitus CV: RRR; no palpable thrills; no pitting edema GI: Abd Right abdomen port; no palpable hepatosplenomegaly MSK: Normal range of motion of extremities; no clubbing/cyanosis Psychiatric: Appropriate affect; alert and oriented x3 Lymphatic: No palpable cervical or axillary lymphadenopathy  Results for orders placed or performed during the hospital encounter of 12/28/21 (from the past 24 hour(s))  Glucose, capillary     Status: None   Collection Time: 12/28/21  6:08 AM  Result Value Ref Range   Glucose-Capillary 91 70 - 99 mg/dL   Comment 1 Notify RN    Comment 2 Document in Chart     Imaging Orders  No imaging studies ordered today     Assessment and Plan   [name] is an 66 y.o. female with a dysfunctional adjustable  laparoscopic gastric band.  I recommended laparoscopic removal.  The procedure, its risks, benefits and alternatives were discussed and the patient granted consent to proceed.       Felicie Morn, MD  [facility] Use [email or web address] to contact on call provider

## 2021-12-28 NOTE — OR Nursing (Signed)
Lap band discarded at end of case per dr stechschulte

## 2021-12-28 NOTE — Op Note (Signed)
   Patient: Bridget Conway (March 12, 1956, [id number])  Date of Surgery: 12/28/2021   Preoperative Diagnosis: DYSPHAGIA FROM LAP BAND   Postoperative Diagnosis: DYSPHAGIA FROM LAP BAND   Surgical Procedure: LAPAROSCOPIC GASTRIC BAND REMOVAL:    Operative Team Members:  Surgeon(s) and Role:    * Keir Viernes, Nickola Major, MD - Primary   No anesthesia staff entered.   Anesthesia: General   Fluids:  Total I/O In: 1100 [I.V.:1000; IV Piggyback:100] Out: 10 [MLYYT:03]  Complications: None  Drains:  none   Specimen: None  Disposition:  PACU - hemodynamically stable.  Plan of Care: Discharge to home after PACU    Indications for Procedure: Bridget Conway is a 66 y.o. female who presented with a malfunctioning lap band.  The procedure itself as well as its risks, benefits and alternatives were discussed.  The risks discussed included but were not limited to the risk of infection, bleeding, damage to nearby structures, and hematoma at port site.  After a full discussion and all questions answered the patient granted consent to proceed.  Findings: Lap band removed with subcutaneous mesh:     Description of Procedure:   On the date stated above the patient was taken the operating room suite and placed supine position.  General endotracheal anesthesia was induced.  A timeout was completed verifying the correct patient, procedure, position, and equipment needed for the case.  The patient's abdomen was prepped and draped in usual sterile fashion.  I made an incision in the left upper quadrant and entered the abdomen using optical technique.  The abdomen was insufflated to 15 mmHg.  2 additional incisions were made, one in the right upper quadrant near the port through her previous port insertion incision, and 1 to the left of the umbilicus through her previous laparoscopic port site scar.  A 15 mm trocar was placed near the port, a 5 mm trocar was placed periumbilically.  All trocars  were placed under direct vision without any trauma to the underlying viscera.  The patient was placed in reverse Trendelenburg positioning.  The Lap-Band was able to be manipulated without placing a liver retractor.  The latch of the Lap-Band was divided using the harmonic scalpel.  The Lap-Band was removed from its position around the stomach and removed through the 15 mm port.  The tubing was divided close to the fascia.  I was careful to remove the connector piece and we have no tubing behind.  The port was removed from the subcutaneous space including a mesh that was originally used to keep the port from flipping.  These were removed using electrocautery.  The suture material holding the port in place was also removed.  The fascia at this port site was closed using a 0 Vicryl suture on a PMI suture passer.  The abdomen was desufflated.  The port site incision was closed in the deep dermal layer using Vicryl suture and at the skin using Monocryl.  The other incisions were closed using Monocryl.  Dermabond was applied to all the incisions.  All sponge and needle counts were correct at the end of this case.  The patient was transferred the postanesthesia care unit, later to be discharged home.  At the end of the case we reviewed the infection status of the case. Patient: Private Patient Elective Case Case: Elective Infection Present At Time Of Surgery (PATOS): None  Louanna Raw, MD General, Bariatric, & Minimally Invasive Surgery [facility], [place]

## 2021-12-28 NOTE — Anesthesia Postprocedure Evaluation (Signed)
Anesthesia Post Note  Patient: [place]  Procedure(s) Performed: LAPAROSCOPIC GASTRIC BAND REMOVAL     Patient location during evaluation: PACU Anesthesia Type: General Level of consciousness: awake and alert Pain management: pain level controlled Vital Signs Assessment: post-procedure vital signs reviewed and stable Respiratory status: spontaneous breathing, nonlabored ventilation and respiratory function stable Cardiovascular status: blood pressure returned to baseline and stable Postop Assessment: no apparent nausea or vomiting Anesthetic complications: no   No notable events documented.  Last Vitals:  Vitals:   12/28/21 1015 12/28/21 1023  BP: 110/80 (!) 141/78  Pulse: 73 81  Resp: 11 16  Temp: 36.5 C 36.5 C  SpO2: 100% 93%    Last Pain:  Vitals:   12/28/21 1023  TempSrc: Axillary  PainSc: 0-No pain                 Lynda Rainwater

## 2021-12-28 NOTE — Transfer of Care (Signed)
Immediate Anesthesia Transfer of Care Note  Patient: Bridget Conway  Procedure(s) Performed: Procedure(s): LAPAROSCOPIC GASTRIC BAND REMOVAL (N/A)  Patient Location: PACU  Anesthesia Type:General  Level of Consciousness: Alert, Awake, Oriented  Airway & Oxygen Therapy: Patient Spontanous Breathing  Post-op Assessment: Report given to RN  Post vital signs: Reviewed and stable  Last Vitals:  Vitals:   12/28/21 0611 12/28/21 0627  BP: (!) 166/107 (!) 162/84  Pulse: 65   Resp: 15   Temp: 36.8 C   SpO2: 61%     Complications: No apparent anesthesia complications

## 2021-12-28 NOTE — Anesthesia Procedure Notes (Signed)
Procedure Name: Intubation Date/Time: 12/28/2021 8:11 AM  Performed by: Gerald Leitz, CRNAPre-anesthesia Checklist: Patient identified, Patient being monitored, Timeout performed, Emergency Drugs available and Suction available Patient Re-evaluated:Patient Re-evaluated prior to induction Oxygen Delivery Method: Circle system utilized Preoxygenation: Pre-oxygenation with 100% oxygen Induction Type: IV induction Ventilation: Mask ventilation without difficulty Laryngoscope Size: Mac and 3 Grade View: Grade I Tube type: Oral Tube size: 7.0 mm Number of attempts: 1 Airway Equipment and Method: Stylet Placement Confirmation: ETT inserted through vocal cords under direct vision, positive ETCO2 and breath sounds checked- equal and bilateral Secured at: 21 cm Tube secured with: Tape Dental Injury: Teeth and Oropharynx as per pre-operative assessment

## 2021-12-28 NOTE — Discharge Instructions (Signed)
POST OPERATIVE INSTRUCTIONS  Thinking Clearly  The anesthesia may cause you to feel different for 1 or 2 days. Do not drive, drink alcohol, or make any big decisions for at least 2 days.  Nutrition When you wake up, you will be able to drink small amounts of liquid. If you do not feel sick, you can slowly advance your diet to regular foods. Continue to drink lots of fluids, usually about 8 to 10 glasses per day. Eat a high-fiber diet so you don't strain during bowel movements. High-Fiber Foods Foods high in fiber include beans, bran cereals and whole-grain breads, peas, dried fruit (figs, apricots, and dates), raspberries, blackberries, strawberries, sweet corn, broccoli, baked potatoes with skin, plums, pears, apples, greens, and nuts. Activity Slowly increase your activity. Be sure to get up and walk every hour or so to prevent blood clots. No heavy lifting or strenuous activity for 4 weeks following surgery to prevent hernias at your incision sites It is normal to feel tired. You may need more sleep than usual.  Get your rest but make sure to get up and move around frequently to prevent blood clots and pneumonia.  Work and Return to [employer] can go back to work when you feel well enough. Discuss the timing with your surgeon. You can usually go back to school or work 1 week after an operation. If your work requires heavy lifting or strenuous activity you need to be placed on light duty for 4 weeks following surgery. You can return to gym class, sports or other physical activities 4 weeks after surgery.  Wound Care Always wash your hands before and after touching near your incision site. Do not soak in a bathtub until cleared at your follow up appointment. You may take a shower 24 hours after surgery. A small amount of drainage from the incision is normal. If the drainage is thick and yellow or the site is red, you may have an infection, so call your surgeon. If you have a drain in  one of your incisions, it will be taken out in office when the drainage stops. Steri-Strips will fall off in 7 to 10 days or they will be removed during your first office visit. If you have dermabond glue covering over the incision, allow the glue to flake off on its own. Avoid wearing tight or rough clothing. It may rub your incisions and make it harder for them to heal. Protect the new skin, especially from the sun. The sun can burn and cause darker scarring. Your scar will heal in about 4 to 6 weeks and will become softer and continue to fade over the next year.  The cosmetic appearance of the incisions will improve over the course of the first year after surgery. Sensation around your incision will return in a few weeks or months.  Bowel Movements After intestinal surgery, you may have loose watery stools for several days. If watery diarrhea lasts longer than 3 days, contact your surgeon. Pain medication (narcotics) can cause constipation. Increase the fiber in your diet with high-fiber foods if you are constipated. You can take an over the counter stool softener like Colace to avoid constipation.  Additional over the counter medications can also be used if Colace isn't sufficient (for example, Milk of Magnesia or Miralax).  Pain The amount of pain is different for each person. Some people need only 1 to 3 doses of pain control medication, while others need more. Take alternating doses of tylenol and  ibuprofen around the clock for the first five days following surgery.  This will provide a baseline of pain control and help with inflammation.  Take the narcotic pain medication in addition if needed for severe pain.  Contact Your Surgeon at [phone], if you have: Pain in your right upper abdomen like a gallbladder attack. Pain that will not go away Pain that gets worse A fever of more than 101F (38.3C) Repeated vomiting Swelling, redness, bleeding, or bad-smelling drainage from your  wound site Strong abdominal pain No bowel movement or unable to pass gas for 3 days Watery diarrhea lasting longer than 3 days  Pain Control The goal of pain control is to minimize pain, keep you moving and help you heal. Your surgical team will work with you on your pain plan. Most often a combination of therapies and medications are used to control your pain. You may also be given medication (local anesthetic) at the surgical site. This may help control your pain for several days. Extreme pain puts extra stress on your body at a time when your body needs to focus on healing. Do not wait until your pain has reached a level "10" or is unbearable before telling your doctor or nurse. It is much easier to control pain before it becomes severe. Following a laparoscopic procedure, pain is sometimes felt in the shoulder. This is due to the gas inserted into your abdomen during the procedure. Moving and walking helps to decrease the gas and the right shoulder pain.  Use the guide below for ways to manage your post-operative pain. Learn more by going to facs.org/safepaincontrol.  How Intense Is My Pain Common Therapies to Feel Better       I hardly notice my pain, and it does not interfere with my activities.  I notice my pain and it distracts me, but I can still do activities (sitting up, walking, standing).  Non-Medication Therapies  Ice (in a bag, applied over clothing at the surgical site), elevation, rest, meditation, massage, distraction (music, TV, play) walking and mild exercise Splinting the abdomen with pillows +  Non-Opioid Medications Acetaminophen (Tylenol) Non-steroidal anti-inflammatory drugs (NSAIDS) Aspirin, Ibuprofen (Motrin, Advil) Naproxen (Aleve) Take these as needed, when you feel pain. Both acetaminophen and NSAIDs help to decrease pain and swelling (inflammation).      My pain is hard to ignore and is more noticeable even when I rest.  My pain interferes with  my usual activities.  Non-Medication Therapies  +  Non-Opioid medications  Take on a regular schedule (around-the-clock) instead of as needed. (For example, Tylenol every 6 hours at 9:00 am, 3:00 pm, 9:00 pm, 3:00 am and Motrin every 6 hours at 12:00 am, 6:00 am, 12:00 pm, 6:00 pm)         I am focused on my pain, and I am not doing my daily activities.  I am groaning in pain, and I cannot sleep. I am unable to do anything.  My pain is as bad as it could be, and nothing else matters.  Non-Medication Therapies  +  Around-the-Clock Non-Opioid Medications  +  Short-acting opioids  Opioids should be used with other medications to manage severe pain. Opioids block pain and give a feeling of euphoria (feel high). Addiction, a serious side effect of opioids, is rare with short-term (a few days) use.  Examples of short-acting opioids include: Tramadol (Ultram), Hydrocodone (Norco, Vicodin), Hydromorphone (Dilaudid), Oxycodone (Oxycontin)     The above directions have been adapted from the  [employer] Surgical Patient Education Program.  Please refer to the ACS website if needed: PureLie.ch.ashx.   Louanna Raw, MD [facility] [address] [place], [place], [place]  [address] ?  [address], [place], [place]   [address] [phone] ? [phone] ? FAX [phone] Web site: [email or web address]

## 2021-12-28 NOTE — Progress Notes (Signed)
Patient was informed that the OR is delayed due to a temperature regulation issue, as soon as the temperature rises to the proper temp.she will go back for surgery.  Patient voiced understanding. Patient c/o being cold, gave patient another warm blanket from the warmer.

## 2021-12-28 NOTE — Anesthesia Preprocedure Evaluation (Signed)
Anesthesia Evaluation  Patient identified by MRN, date of birth, ID band Patient awake    Reviewed: Allergy & Precautions, NPO status , Patient's Chart, lab work & pertinent test results  Airway Mallampati: III  TM Distance: >3 FB Neck ROM: Full    Dental  (+) Dental Advisory Given, Partial Upper, Partial Lower   Pulmonary former smoker,    Pulmonary exam normal breath sounds clear to auscultation       Cardiovascular hypertension, Pt. on medications Normal cardiovascular exam Rhythm:Regular Rate:Normal     Neuro/Psych PSYCHIATRIC DISORDERS Anxiety Depression negative neurological ROS     GI/Hepatic Neg liver ROS, GERD  ,  Endo/Other  diabetes, Type 2Morbid obesity  Renal/GU negative Renal ROS     Musculoskeletal  (+) Arthritis , Osteoarthritis,    Abdominal (+) + obese,   Peds  Hematology negative hematology ROS (+)   Anesthesia Other Findings Day of surgery medications reviewed with the patient.  Reproductive/Obstetrics                             Anesthesia Physical  Anesthesia Plan  ASA: III  Anesthesia Plan: General   Post-op Pain Management:  Regional for Post-op pain and Dilaudid IV, Gabapentin PO (pre-op)* and Tylenol PO (pre-op)*   Induction: Intravenous  PONV Risk Score and Plan: 3 and Midazolam, Dexamethasone, Ondansetron and Treatment may vary due to age or medical condition  Airway Management Planned: Oral ETT  Additional Equipment:   Intra-op Plan:   Post-operative Plan: Extubation in OR  Informed Consent: I have reviewed the patients History and Physical, chart, labs and discussed the procedure including the risks, benefits and alternatives for the proposed anesthesia with the patient or authorized representative who has indicated his/her understanding and acceptance.     Dental advisory given  Plan Discussed with: CRNA  Anesthesia Plan Comments:          Anesthesia Quick Evaluation

## 2021-12-29 ENCOUNTER — Encounter (HOSPITAL_COMMUNITY): Payer: Self-pay | Admitting: Surgery

## 2022-01-10 ENCOUNTER — Other Ambulatory Visit: Payer: Self-pay | Admitting: Nurse Practitioner

## 2022-01-10 DIAGNOSIS — F331 Major depressive disorder, recurrent, moderate: Secondary | ICD-10-CM

## 2022-01-14 DIAGNOSIS — H2513 Age-related nuclear cataract, bilateral: Secondary | ICD-10-CM | POA: Diagnosis not present

## 2022-01-14 DIAGNOSIS — H40033 Anatomical narrow angle, bilateral: Secondary | ICD-10-CM | POA: Diagnosis not present

## 2022-01-25 ENCOUNTER — Telehealth: Payer: Self-pay | Admitting: Family Medicine

## 2022-01-25 DIAGNOSIS — M9903 Segmental and somatic dysfunction of lumbar region: Secondary | ICD-10-CM | POA: Diagnosis not present

## 2022-01-25 DIAGNOSIS — M9902 Segmental and somatic dysfunction of thoracic region: Secondary | ICD-10-CM | POA: Diagnosis not present

## 2022-01-25 DIAGNOSIS — M9901 Segmental and somatic dysfunction of cervical region: Secondary | ICD-10-CM | POA: Diagnosis not present

## 2022-01-25 DIAGNOSIS — M546 Pain in thoracic spine: Secondary | ICD-10-CM | POA: Diagnosis not present

## 2022-01-25 DIAGNOSIS — M542 Cervicalgia: Secondary | ICD-10-CM | POA: Diagnosis not present

## 2022-01-25 DIAGNOSIS — M47816 Spondylosis without myelopathy or radiculopathy, lumbar region: Secondary | ICD-10-CM | POA: Diagnosis not present

## 2022-01-26 ENCOUNTER — Other Ambulatory Visit: Payer: Medicare Other

## 2022-01-26 ENCOUNTER — Other Ambulatory Visit: Payer: Self-pay

## 2022-01-26 DIAGNOSIS — Z136 Encounter for screening for cardiovascular disorders: Secondary | ICD-10-CM | POA: Diagnosis not present

## 2022-01-26 DIAGNOSIS — I10 Essential (primary) hypertension: Secondary | ICD-10-CM | POA: Diagnosis not present

## 2022-01-26 DIAGNOSIS — R6889 Other general symptoms and signs: Secondary | ICD-10-CM | POA: Diagnosis not present

## 2022-01-26 DIAGNOSIS — K219 Gastro-esophageal reflux disease without esophagitis: Secondary | ICD-10-CM | POA: Diagnosis not present

## 2022-01-26 DIAGNOSIS — Z0000 Encounter for general adult medical examination without abnormal findings: Secondary | ICD-10-CM | POA: Diagnosis not present

## 2022-01-26 DIAGNOSIS — E785 Hyperlipidemia, unspecified: Secondary | ICD-10-CM | POA: Diagnosis not present

## 2022-01-26 NOTE — Telephone Encounter (Signed)
Looks like this has been completed.

## 2022-01-27 LAB — CBC WITH DIFFERENTIAL/PLATELET
Basophils Absolute: 0 10*3/uL (ref 0.0–0.2)
Basos: 1 %
EOS (ABSOLUTE): 0.1 10*3/uL (ref 0.0–0.4)
Eos: 2 %
Hematocrit: 40.1 % (ref 34.0–46.6)
Hemoglobin: 13.6 g/dL (ref 11.1–15.9)
Immature Grans (Abs): 0 10*3/uL (ref 0.0–0.1)
Immature Granulocytes: 0 %
Lymphocytes Absolute: 1.8 10*3/uL (ref 0.7–3.1)
Lymphs: 32 %
MCH: 32.9 pg (ref 26.6–33.0)
MCHC: 33.9 g/dL (ref 31.5–35.7)
MCV: 97 fL (ref 79–97)
Monocytes Absolute: 0.5 10*3/uL (ref 0.1–0.9)
Monocytes: 8 %
Neutrophils Absolute: 3.3 10*3/uL (ref 1.4–7.0)
Neutrophils: 57 %
Platelets: 240 10*3/uL (ref 150–450)
RBC: 4.14 x10E6/uL (ref 3.77–5.28)
RDW: 12.3 % (ref 11.7–15.4)
WBC: 5.7 10*3/uL (ref 3.4–10.8)

## 2022-01-27 LAB — CMP14+EGFR
ALT: 17 IU/L (ref 0–32)
AST: 18 IU/L (ref 0–40)
Albumin/Globulin Ratio: 1.3 (ref 1.2–2.2)
Albumin: 3.7 g/dL — ABNORMAL LOW (ref 3.9–4.9)
Alkaline Phosphatase: 67 IU/L (ref 44–121)
BUN/Creatinine Ratio: 38 — ABNORMAL HIGH (ref 12–28)
BUN: 25 mg/dL (ref 8–27)
Bilirubin Total: 0.4 mg/dL (ref 0.0–1.2)
CO2: 27 mmol/L (ref 20–29)
Calcium: 8.9 mg/dL (ref 8.7–10.3)
Chloride: 100 mmol/L (ref 96–106)
Creatinine, Ser: 0.65 mg/dL (ref 0.57–1.00)
Globulin, Total: 2.8 g/dL (ref 1.5–4.5)
Glucose: 91 mg/dL (ref 70–99)
Potassium: 3.9 mmol/L (ref 3.5–5.2)
Sodium: 139 mmol/L (ref 134–144)
Total Protein: 6.5 g/dL (ref 6.0–8.5)
eGFR: 96 mL/min/{1.73_m2} (ref >59)

## 2022-01-27 LAB — LIPID PANEL
Chol/HDL Ratio: 3.9 ratio (ref 0.0–4.4)
Cholesterol, Total: 201 mg/dL — ABNORMAL HIGH (ref 100–199)
HDL: 52 mg/dL (ref >39)
LDL Chol Calc (NIH): 132 mg/dL — ABNORMAL HIGH (ref 0–99)
Triglycerides: 92 mg/dL (ref 0–149)
VLDL Cholesterol Cal: 17 mg/dL (ref 5–40)

## 2022-01-27 LAB — THYROID PANEL WITH TSH
Free Thyroxine Index: 2.3 (ref 1.2–4.9)
T3 Uptake Ratio: 35 % (ref 24–39)
T4, Total: 6.5 ug/dL (ref 4.5–12.0)
TSH: 2.09 u[IU]/mL (ref 0.450–4.500)

## 2022-01-27 LAB — VITAMIN B12: Vitamin B-12: 506 pg/mL (ref 232–1245)

## 2022-01-27 LAB — VITAMIN D 25 HYDROXY (VIT D DEFICIENCY, FRACTURES): Vit D, 25-Hydroxy: 40.7 ng/mL (ref 30.0–100.0)

## 2022-01-28 ENCOUNTER — Ambulatory Visit (INDEPENDENT_AMBULATORY_CARE_PROVIDER_SITE_OTHER): Payer: Medicare Other | Admitting: Nurse Practitioner

## 2022-01-28 ENCOUNTER — Encounter: Payer: Self-pay | Admitting: Nurse Practitioner

## 2022-01-28 VITALS — BP 141/81 | HR 73 | Temp 98.7°F | Ht 65.0 in | Wt 253.0 lb

## 2022-01-28 DIAGNOSIS — E119 Type 2 diabetes mellitus without complications: Secondary | ICD-10-CM | POA: Diagnosis not present

## 2022-01-28 DIAGNOSIS — I119 Hypertensive heart disease without heart failure: Secondary | ICD-10-CM

## 2022-01-28 DIAGNOSIS — Z0000 Encounter for general adult medical examination without abnormal findings: Secondary | ICD-10-CM

## 2022-01-28 DIAGNOSIS — F331 Major depressive disorder, recurrent, moderate: Secondary | ICD-10-CM

## 2022-01-28 DIAGNOSIS — Z0001 Encounter for general adult medical examination with abnormal findings: Secondary | ICD-10-CM

## 2022-01-28 DIAGNOSIS — F339 Major depressive disorder, recurrent, unspecified: Secondary | ICD-10-CM

## 2022-01-28 MED ORDER — FUROSEMIDE 20 MG PO TABS: 20.0000 mg | ORAL_TABLET | Freq: Two times a day (BID) | ORAL | 1 refills | Status: DC

## 2022-01-28 MED ORDER — SERTRALINE HCL 100 MG PO TABS: ORAL_TABLET | ORAL | 0 refills | Status: DC

## 2022-01-28 MED ORDER — FLUTICASONE PROPIONATE 50 MCG/ACT NA SUSP: 1.0000 | Freq: Every day | NASAL | 5 refills | Status: DC | PRN

## 2022-01-28 NOTE — Progress Notes (Addendum)
Established Patient Office Visit  Subjective   Patient ID: Bridget Conway, female    DOB: November 13, 1955  Age: 66 y.o. MRN: [id number]  Chief Complaint  Patient presents with   Annual Exam   Edema    Left ankle swelling every day    HPI  Encounter for general adult medical examination Physical: Patient's last physical exam was 1 year ago .  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 Dental Care: annual cleanings, brushes and flosses daily. Ophthalmology/Optometry: Annual visit.  Hearing loss: none Vision impairments: Wears prescription glasses. Patient Active Problem List   Diagnosis Date Noted   HTN (hypertension) 02/10/2022   Statin intolerance 02/10/2022   Muscle cramps 02/10/2022   Heart murmur 02/10/2022   UTI symptoms 11/06/2020   Chronic pain 09/09/2020   Atypical ductal hyperplasia of both breasts 07/17/2020   Genetic testing 07/14/2020   Family history of breast cancer    Family history of cervical cancer    Family history of uterine cancer    Family history of melanoma    Family history of colon cancer    Family history of pancreatic cancer    Family history of brain cancer    Family history of lung cancer    History of nonmelanoma skin cancer    Wart of  face 02/03/2020   Annual physical exam 01/27/2020   Atypical ductal hyperplasia of right breast 11/11/2019   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 11/11/2019   Mitral regurgitation 09/08/2018   Morbid obesity ([place]) 09/08/2018   Gross hematuria 02/25/2018   HLD (hyperlipidemia) 06/06/2014   Hypertension with heart disease 12/19/2013   Hypokalemia 12/19/2013   GERD (gastroesophageal reflux disease) 12/19/2013   Depression, recurrent ([place]) 12/19/2013   Past Medical History:  Diagnosis Date   Allergy    Anxiety    Arthritis    Atypical ductal hyperplasia of right breast    Cancer ([place])    basal cell CA- below eye   Depression    Family history of brain cancer    Family history of breast cancer    Family history of cervical cancer    Family history of colon cancer    Family history of lung cancer    Family history of melanoma    Family history of pancreatic cancer    Family history of uterine cancer    GERD (gastroesophageal reflux disease)    History of kidney stones    History of nonmelanoma skin cancer    Hypertension    no meds, has white coat syndrome   IFG (impaired fasting glucose) 2014   Osteoporosis    Pre-diabetes    Wears glasses    Wears partial dentures    upper partial   Past Surgical History:  Procedure Laterality Date   ABDOMINAL HYSTERECTOMY  BREAST LUMPECTOMY WITH RADIOACTIVE SEED LOCALIZATION Right 12/27/2019   Procedure: RIGHT BREAST LUMPECTOMY WITH RADIOACTIVE SEED LOCALIZATION;  Surgeon: Jovita Kussmaul, MD;  Location: [place];  Service: General;  Laterality: Right;   CARPAL TUNNEL RELEASE Left 07/11/2013   Procedure: LEFT CARPAL TUNNEL RELEASE;  Surgeon: Tennis Must, MD;  Location: [place];  Service: Orthopedics;  Laterality: Left;   carpel tunnel  2005   right wrist   COLONOSCOPY     hysterectomy  2002   KNEE SURGERY Left    LAPAROSCOPIC GASTRIC BANDING  2010   LAPAROSCOPIC GASTRIC RESECTION  N/A 12/28/2021   Procedure: LAPAROSCOPIC GASTRIC BAND REMOVAL;  Surgeon: Felicie Morn, MD;  Location: [facility];  Service: General;  Laterality: N/A;   TOTAL MASTECTOMY Bilateral 07/17/2020   Procedure: BILATERAL MASTECTOMY;  Surgeon: Jovita Kussmaul, MD;  Location: [place];  Service: General;  Laterality: Bilateral;   Social History   Tobacco Use   Smoking status: Former    Types: Cigarettes    Quit date: 07/09/2000    Years since quitting: 21.6   Smokeless tobacco: Never  Vaping Use   Vaping Use: Never used  Substance Use Topics   Alcohol use: No   Drug use: No   Social History   Socioeconomic History   Marital status: Divorced    Spouse name: Not on file   Number of children: 1   Years of education: Not on file   Highest education level: Not on file  Occupational History   Occupation: retired  Tobacco Use   Smoking status: Former    Types: Cigarettes    Quit date: 07/09/2000    Years since quitting: 21.6   Smokeless tobacco: Never  Vaping Use   Vaping Use: Never used  Substance and Sexual Activity   Alcohol use: No   Drug use: No   Sexual activity: Not on file  Other Topics Concern   Not on file  Social History Narrative   Lives with significant other   Double mastectomy in 07/2020 - Dr Marlou Starks   Routine visits with Dr Lindi Adie, Oncologist   Social Determinants of Health   Financial Resource Strain: [place]  (09/10/2021)   Overall Financial Resource Strain (CARDIA)    Difficulty of Paying Living Expenses: Not hard at all  Food Insecurity: No Food Insecurity (09/10/2021)   Hunger Vital Sign    Worried About Running Out of Food in the Last Year: Never true    [place] in the Last Year: Never true  Transportation Needs: No Transportation Needs (09/10/2021)   PRAPARE - Hydrologist (Medical): No    Lack of Transportation (Non-Medical): No  Physical Activity: Insufficiently Active (09/10/2021)   Exercise Vital Sign     Days of Exercise per Week: 7 days    Minutes of Exercise per Session: 20 min  Stress: No Stress Concern Present (09/10/2021)   [place]    Feeling of Stress : Not at all  Social Connections: Moderately Integrated (09/10/2021)   Social Connection and Isolation Panel [NHANES]    Frequency of Communication with Friends and Family: More than three times a week    Frequency of Social Gatherings with Friends and Family: More than three times a week    Attends Religious Services: Never    Marine scientist or Organizations: Yes    Attends Club or  Organization Meetings: More than 4 times per year    Marital Status: Living with partner  Intimate Partner Violence: Not At Risk (09/10/2021)   Humiliation, Afraid, Rape, and Kick questionnaire    Fear of Current or Ex-Partner: No    Emotionally Abused: No    Physically Abused: No    Sexually Abused: No   Family Status  Relation Name Status   Mother  Deceased at age 62   Father  Deceased at age 5   Sister Cam Deceased   Brother Gwyndolyn Saxon Deceased   Sister [place] Alive   Mat Aunt [facility] Alive   [place] x7 Deceased   Pat Ples Specter Deceased   MGM  Deceased   MGF  Deceased   [place]  Deceased   PGF  Deceased   Mat Aunt Sybil Deceased   Mat Aunt Environmental education officer Deceased   [place] Deceased   [place] Ersie Deceased   [place] Deceased   [place]   Neg Hx  (Not Specified)   Family History  Problem Relation Age of Onset   Breast cancer Mother 40   Heart attack Father    Cervical cancer Sister 29   Uterine cancer Maternal Aunt 33   Melanoma Cousin 60       head (maternal first cousin)   Lung cancer Cousin 50       (paternal first cousin)   Colon cancer Cousin 62       (paternal first cousin)   Lung cancer Cousin 51       (paternal first cousin)   Colon cancer Cousin 52        (paternal first cousin)   Brain cancer Cousin 10       (paternal first cousin)   Lung cancer Cousin 16       (paternal first cousin)   Pancreatic cancer Cousin 42       (paternal first cousin)   Esophageal cancer Neg Hx    Rectal cancer Neg Hx    Stomach cancer Neg Hx    Allergies  Allergen Reactions   Lisinopril Cough   Nsaids     Fluid retention    Statins     Muscle pain   Sulfa Antibiotics Hives   Latex Rash       Hypertension: Patient is here for evaluation of elevated blood pressures.  Age at onset of elevated blood pressure:  58.Cardiac symptoms fatigue. Patient denies chest pain, chest pressure/discomfort, and near-syncope.  Cardiovascular risk factors: advanced age (older than 37 for men, 76 for women) and obesity (BMI >= 30 kg/m2). Use of agents associated with hypertension: none. History of target organ damage: none.       Review of Systems  Constitutional: Negative.  Negative for chills, fever and malaise/fatigue.  HENT: Negative.    Respiratory: Negative.    Cardiovascular: Negative.   Gastrointestinal: Negative.   Genitourinary: Negative.   Musculoskeletal: Negative.   Skin: Negative.  Negative for itching and rash.  All other systems reviewed and are negative.     Objective:     BP (!) 141/81   Pulse 73   Temp 98.7 F (37.1 C)   Ht _0  (1.651 m)   Wt 253 lb (114.8 kg)   SpO2 95%   BMI 42.10 kg/m  BP Readings from Last 3 Encounters:  02/10/22 128/80  01/28/22 Marland Kitchen)  141/81  12/28/21 (!) 141/78   Wt Readings from Last 3 Encounters:  02/10/22 255 lb (115.7 kg)  01/28/22 253 lb (114.8 kg)  12/28/21 248 lb 0.3 oz (112.5 kg)      Physical Exam Vitals and nursing note reviewed.  Constitutional:      Appearance: Normal appearance. She is obese.  HENT:     Head: Normocephalic.     Right Ear: Ear canal and external ear normal.     Left Ear: Ear canal and external ear normal.     Nose: Nose normal.     Mouth/Throat:     Mouth: Mucous  membranes are moist.     Pharynx: Oropharynx is clear.  Eyes:     Conjunctiva/sclera: Conjunctivae normal.     Pupils: Pupils are equal, round, and reactive to light.  Cardiovascular:     Rate and Rhythm: Normal rate and regular rhythm.     Pulses: Normal pulses.     Heart sounds: Normal heart sounds.  Pulmonary:     Effort: Pulmonary effort is normal.     Breath sounds: Normal breath sounds.  Abdominal:     General: Bowel sounds are normal.  Musculoskeletal:     Left lower leg: 1+ Edema present.  Skin:    General: Skin is warm.     Findings: No erythema or rash.  Neurological:     General: No focal deficit present.     Mental Status: She is alert and oriented to person, place, and time.  Psychiatric:        Mood and Affect: Mood normal.        Behavior: Behavior normal.      Results for orders placed or performed in visit on 01/28/22  Microalbumin / creatinine urine ratio  Result Value Ref Range   Creatinine, Urine 22.9 Not Estab. mg/dL   Microalbumin, Urine <3.0 Not Estab. ug/mL   Microalb/Creat Ratio <13 0 - 29 mg/g creat    Last CBC Lab Results  Component Value Date   WBC 5.7 01/26/2022   HGB 13.6 01/26/2022   HCT 40.1 01/26/2022   MCV 97 01/26/2022   MCH 32.9 01/26/2022   RDW 12.3 01/26/2022   PLT 240 09/38/1829   Last metabolic panel Lab Results  Component Value Date   GLUCOSE 91 01/26/2022   NA 139 01/26/2022   K 3.9 01/26/2022   CL 100 01/26/2022   CO2 27 01/26/2022   BUN 25 01/26/2022   CREATININE 0.65 01/26/2022   EGFR 96 01/26/2022   CALCIUM 8.9 01/26/2022   PROT 6.5 01/26/2022   ALBUMIN 3.7 (L) 01/26/2022   LABGLOB 2.8 01/26/2022   AGRATIO 1.3 01/26/2022   BILITOT 0.4 01/26/2022   ALKPHOS 67 01/26/2022   AST 18 01/26/2022   ALT 17 01/26/2022   ANIONGAP 6 12/15/2021   Last lipids Lab Results  Component Value Date   CHOL 201 (H) 01/26/2022   HDL 52 01/26/2022   LDLCALC 132 (H) 01/26/2022   TRIG 92 01/26/2022   CHOLHDL 3.9  01/26/2022   Last hemoglobin A1c Lab Results  Component Value Date   HGBA1C 5.4 12/15/2021   Last thyroid functions Lab Results  Component Value Date   TSH 2.090 01/26/2022   T4TOTAL 6.5 01/26/2022   Last vitamin D Lab Results  Component Value Date   VD25OH 40.7 01/26/2022   Last vitamin B12 and Folate Lab Results  Component Value Date   VITAMINB12 506 01/26/2022      The 10-year  ASCVD risk score (Arnett DK, et al., 2019) is: 19.2%    Assessment & Plan:  Completed annual physical exams, labs completed results pending, education provided on health maintenance and preventative care. Problem List Items Addressed This Visit       Cardiovascular and Mediastinum   Hypertension with heart disease - Primary    Patients blood pressure elevated today, patient reports that Blood pressure is well controlled at home and had BP log at time of visit. 10/18-122/76 10/19 am -128/79 10/19 pm- 124/74 10/20 am-124/76  refer to cardiology to reassess cardiac status and long term use of lasix. Patient has been on lasix for over 25 years per patient.  Recent left ankle edema.      Relevant Orders   Ambulatory referral to Cardiology     Other   Depression, recurrent ([place])   Relevant Medications   sertraline (ZOLOFT) 100 MG tablet   Annual physical exam   Other Visit Diagnoses     Diabetes mellitus without complication ([place])       Relevant Orders   Microalbumin / creatinine urine ratio (Completed)       Return in about 1 year (around 01/29/2023) for annual physical.    Ivy [name], NP

## 2022-01-28 NOTE — Assessment & Plan Note (Addendum)
Patients blood pressure elevated today, patient reports that Blood pressure is well controlled at home and had BP log at time of visit. 10/18-122/76 10/19 am -128/79 10/19 pm- 124/74 10/20 am-124/76  refer to cardiology to reassess cardiac status and long term use of lasix. Patient has been on lasix for over 25 years per patient.  Recent left ankle edema.

## 2022-01-28 NOTE — Patient Instructions (Signed)
Health Maintenance After Age 68 After age 68, you are at a higher risk for certain long-term diseases and infections as well as injuries from falls. Falls are a major cause of broken bones and head injuries in people who are older than age 68. Getting regular preventive care can help to keep you healthy and well. Preventive care includes getting regular testing and making lifestyle changes as recommended by your health care provider. Talk with your health care provider about: Which screenings and tests you should have. A screening is a test that checks for a disease when you have no symptoms. A diet and exercise plan that is right for you. What should I know about screenings and tests to prevent falls? Screening and testing are the best ways to find a health problem early. Early diagnosis and treatment give you the best chance of managing medical conditions that are common after age 68. Certain conditions and lifestyle choices may make you more likely to have a fall. Your health care provider may recommend: Regular vision checks. Poor vision and conditions such as cataracts can make you more likely to have a fall. If you wear glasses, make sure to get your prescription updated if your vision changes. Medicine review. Work with your health care provider to regularly review all of the medicines you are taking, including over-the-counter medicines. Ask your health care provider about any side effects that may make you more likely to have a fall. Tell your health care provider if any medicines that you take make you feel dizzy or sleepy. Strength and balance checks. Your health care provider may recommend certain tests to check your strength and balance while standing, walking, or changing positions. Foot health exam. Foot pain and numbness, as well as not wearing proper footwear, can make you more likely to have a fall. Screenings, including: Osteoporosis screening. Osteoporosis is a condition that causes  the bones to get weaker and break more easily. Blood pressure screening. Blood pressure changes and medicines to control blood pressure can make you feel dizzy. Depression screening. You may be more likely to have a fall if you have a fear of falling, feel depressed, or feel unable to do activities that you used to do. Alcohol use screening. Using too much alcohol can affect your balance and may make you more likely to have a fall. Follow these instructions at home: Lifestyle Do not drink alcohol if: Your health care provider tells you not to drink. If you drink alcohol: Limit how much you have to: 0-1 drink a day for women. 0-2 drinks a day for men. Know how much alcohol is in your drink. In the [place] one drink equals one 12 oz bottle of beer (355 mL), one 5 oz glass of wine (148 mL), or one 1 oz glass of hard liquor (44 mL). Do not use any products that contain nicotine or tobacco. These products include cigarettes, chewing tobacco, and vaping devices, such as e-cigarettes. If you need help quitting, ask your health care provider. Activity  Follow a regular exercise program to stay fit. This will help you maintain your balance. Ask your health care provider what types of exercise are appropriate for you. If you need a cane or walker, use it as recommended by your health care provider. Wear supportive shoes that have nonskid soles. Safety  Remove any tripping hazards, such as rugs, cords, and clutter. Install safety equipment such as grab bars in bathrooms and safety rails on stairs. Keep rooms and walkways   well-lit. General instructions Talk with your health care provider about your risks for falling. Tell your health care provider if: You fall. Be sure to tell your health care provider about all falls, even ones that seem minor. You feel dizzy, tiredness (fatigue), or off-balance. Take over-the-counter and prescription medicines only as told by your health care provider. These include  supplements. Eat a healthy diet and maintain a healthy weight. A healthy diet includes low-fat dairy products, low-fat (lean) meats, and fiber from whole grains, beans, and lots of fruits and vegetables. Stay current with your vaccines. Schedule regular health, dental, and eye exams. Summary Having a healthy lifestyle and getting preventive care can help to protect your health and wellness after age 68. Screening and testing are the best way to find a health problem early and help you avoid having a fall. Early diagnosis and treatment give you the best chance for managing medical conditions that are more common for people who are older than age 68. Falls are a major cause of broken bones and head injuries in people who are older than age 68. Take precautions to prevent a fall at home. Work with your health care provider to learn what changes you can make to improve your health and wellness and to prevent falls. This information is not intended to replace advice given to you by your health care provider. Make sure you discuss any questions you have with your health care provider. Document Revised: 08/17/2020 Document Reviewed: 08/17/2020 Elsevier Patient Education  2023 Elsevier Inc.  

## 2022-01-29 LAB — MICROALBUMIN / CREATININE URINE RATIO
Creatinine, Urine: 22.9 mg/dL
Microalb/Creat Ratio: <13 mg/g creat (ref 0–29)
Microalbumin, Urine: <3 ug/mL

## 2022-02-10 ENCOUNTER — Encounter: Payer: Self-pay | Admitting: *Deleted

## 2022-02-10 ENCOUNTER — Ambulatory Visit: Payer: Medicare Other | Attending: Internal Medicine | Admitting: Internal Medicine

## 2022-02-10 ENCOUNTER — Encounter: Payer: Self-pay | Admitting: Internal Medicine

## 2022-02-10 VITALS — BP 128/80 | HR 70 | Ht 65.0 in | Wt 255.0 lb

## 2022-02-10 DIAGNOSIS — I10 Essential (primary) hypertension: Secondary | ICD-10-CM | POA: Diagnosis not present

## 2022-02-10 DIAGNOSIS — E785 Hyperlipidemia, unspecified: Secondary | ICD-10-CM | POA: Diagnosis not present

## 2022-02-10 DIAGNOSIS — I119 Hypertensive heart disease without heart failure: Secondary | ICD-10-CM | POA: Diagnosis not present

## 2022-02-10 DIAGNOSIS — Z79899 Other long term (current) drug therapy: Secondary | ICD-10-CM | POA: Diagnosis not present

## 2022-02-10 DIAGNOSIS — Z789 Other specified health status: Secondary | ICD-10-CM

## 2022-02-10 DIAGNOSIS — R011 Cardiac murmur, unspecified: Secondary | ICD-10-CM | POA: Diagnosis not present

## 2022-02-10 DIAGNOSIS — R252 Cramp and spasm: Secondary | ICD-10-CM | POA: Diagnosis not present

## 2022-02-10 DIAGNOSIS — R944 Abnormal results of kidney function studies: Secondary | ICD-10-CM | POA: Diagnosis not present

## 2022-02-10 MED ORDER — BEMPEDOIC ACID 180 MG PO TABS: 180.0000 mg | ORAL_TABLET | Freq: Every day | ORAL | 3 refills | Status: DC

## 2022-02-10 MED ORDER — POTASSIUM CHLORIDE CRYS ER 20 MEQ PO TBCR: 20.0000 meq | EXTENDED_RELEASE_TABLET | Freq: Every day | ORAL | 3 refills | Status: DC

## 2022-02-10 NOTE — Progress Notes (Signed)
Cardiology Office Note  Date: 02/10/2022   ID: Br[facility], [place] [birth year], MRN [id number]  PCP:  Bridget [name], NP  Cardiologist:  Bridget Guest, MD Electrophysiologist:  None   Reason for Office Visit: Evaluation of HTN and left atrial enlargement on EKG at the request of [place], NP   History of Present Illness: Br[facility] is a 66 y.o. female known to have HTN, prediabetes, HLD was referred to cardiology clinic for evaluation of HTN and left atrial enlargement on EKG at the request of Bridget Pellant, NP.  Patient was prior seen by [facility] in [place] when echo and stress test was performed in 2019. I am not able to retrieve the echo report from care everywhere. Patient brought the stress test result with her today which showed no evidence of ischemia.  Patient is here for new visit to cardiology clinic. Patient denied any rest or exertional chest discomfort, tightness, heaviness or pressure, rest or exertional dyspnea, palpitations, light-headedness, syncope and LE swelling. Compliant with medications and no side-effects. Denied smoking cigarettes, alcohol use or illicit drug abuse. Family history is positive for heart attack in her dad and he died in his 7s, her sister had valve replacement and had a stroke.  Patient tried multiple statins (3 of them, unable to recall the names) with low and high doses and with decreased frequency, however she continued to have myalgias and hence statins were discontinued.   Past Medical History:  Diagnosis Date   Allergy    Anxiety    Arthritis    Atypical ductal hyperplasia of right breast    Cancer ([place])    basal cell CA- below eye   Depression    Family history of brain cancer    Family history of breast cancer    Family history of cervical cancer    Family history of colon cancer    Family history of lung cancer    Family history of melanoma    Family history of pancreatic cancer    Family history of  uterine cancer    GERD (gastroesophageal reflux disease)    History of kidney stones    History of nonmelanoma skin cancer    Hypertension    no meds, has white coat syndrome   IFG (impaired fasting glucose) 2014   Osteoporosis    Pre-diabetes    Wears glasses    Wears partial dentures    upper partial    Past Surgical History:  Procedure Laterality Date   ABDOMINAL HYSTERECTOMY     BREAST LUMPECTOMY WITH RADIOACTIVE SEED LOCALIZATION Right 12/27/2019   Procedure: RIGHT BREAST LUMPECTOMY WITH RADIOACTIVE SEED LOCALIZATION;  Surgeon: Jovita Kussmaul, MD;  Location: [facility];  Service: General;  Laterality: Right;   CARPAL TUNNEL RELEASE Left 07/11/2013   Procedure: LEFT CARPAL TUNNEL RELEASE;  Surgeon: Tennis Must, MD;  Location: [facility];  Service: Orthopedics;  Laterality: Left;   carpel tunnel  2005   right wrist   COLONOSCOPY     hysterectomy  2002   KNEE SURGERY Left    LAPAROSCOPIC GASTRIC BANDING  2010   LAPAROSCOPIC GASTRIC RESECTION N/A 12/28/2021   Procedure: LAPAROSCOPIC GASTRIC BAND REMOVAL;  Surgeon: Felicie Morn, MD;  Location: [facility];  Service: General;  Laterality: N/A;   TOTAL MASTECTOMY Bilateral 07/17/2020   Procedure: BILATERAL MASTECTOMY;  Surgeon: Jovita Kussmaul, MD;  Location: [facility];  Service: General;  Laterality: Bilateral;  Current Outpatient Medications  Medication Sig Dispense Refill   acetaminophen (TYLENOL) 650 MG CR tablet Take 1,300 mg by mouth every 8 (eight) hours as needed for pain.     Bempedoic Acid 180 MG TABS Take 180 mg by mouth daily. 30 tablet 3   Calcium Carb-Cholecalciferol (CALCIUM 600 + D PO) Take 1 tablet by mouth daily.     diphenhydrAMINE-zinc acetate (BENADRYL) cream Apply 1 Application topically 3 (three) times daily as needed for itching.     fluticasone (FLONASE) 50 MCG/ACT nasal spray Place 1 spray into both nostrils daily as needed for allergies. 11.1 mL 5    furosemide (LASIX) 20 MG tablet Take 20 mg by mouth daily.     GLUCOSAMINE-VITAMIN D PO Take 2 tablets by mouth daily.     Melatonin 10 MG TABS Take 10 mg by mouth at bedtime as needed (sleep).     Multiple Vitamins-Minerals (MULTIVITAMIN PO) Take 1 tablet by mouth daily.     Omega 3-6-9 Fatty Acids (TRIPLE OMEGA-3-6-9) CAPS Take 2 capsules by mouth daily.     potassium chloride SA (KLOR-CON M) 20 MEQ tablet Take 1 tablet (20 mEq total) by mouth daily. 90 tablet 3   Probiotic Product (PROBIOTIC DAILY PO) Take 1 capsule by mouth daily.     sertraline (ZOLOFT) 100 MG tablet Take 1 tablet bu mouth daily 90 tablet 0   TURMERIC PO Take 1,200 mg by mouth daily.     No current facility-administered medications for this visit.   Allergies:  Lisinopril, Nsaids, Statins, Sulfa antibiotics, and Latex   Social History: The patient  reports that she quit smoking about 21 years ago. Her smoking use included cigarettes. She has never used smokeless tobacco. She reports that she does not drink alcohol and does not use drugs.   Family History: The patient's family history includes Brain cancer (age of onset: 33) in her cousin; Breast cancer (age of onset: 66) in her mother; Cervical cancer (age of onset: 55) in her sister; Colon cancer (age of onset: 66) in her cousin; Colon cancer (age of onset: 73) in her cousin; Heart attack in her father; Lung cancer (age of onset: 60) in her cousin; Lung cancer (age of onset: 77) in her cousin; Lung cancer (age of onset: 48) in her cousin; Melanoma (age of onset: 45) in her cousin; Pancreatic cancer (age of onset: 64) in her cousin; Uterine cancer (age of onset: 40) in her maternal aunt.   ROS:  Please see the history of present illness. Otherwise, complete review of systems is positive for none.  All other systems are reviewed and negative.   Physical Exam: VS:  BP 128/80   Pulse 70   Ht '5\' 5"'$  (1.651 m)   Wt 255 lb (115.7 kg)   SpO2 97%   BMI 42.43 kg/m , BMI Body  mass index is 42.43 kg/m.  Wt Readings from Last 3 Encounters:  02/10/22 255 lb (115.7 kg)  01/28/22 253 lb (114.8 kg)  12/28/21 248 lb 0.3 oz (112.5 kg)    General: Patient appears comfortable at rest. HEENT: Conjunctiva and lids normal, oropharynx clear with moist mucosa. Neck: Supple, no elevated JVP or carotid bruits, no thyromegaly. Lungs: Clear to auscultation, nonlabored breathing at rest. Cardiac: Regular rate and rhythm, Grade 2-3 systolic murmur Abdomen: Soft, nontender, no hepatomegaly, bowel sounds present, no guarding or rebound. Extremities: No pitting edema, distal pulses 2+. Skin: Warm and dry. Musculoskeletal: No kyphosis. Neuropsychiatric: Alert and oriented x3, affect grossly  appropriate.  ECG:  An ECG dated 12/15/2021 was personally reviewed today and demonstrated:  NSR  Recent Labwork: 01/26/2022: ALT 17; AST 18; BUN 25; Creatinine, Ser 0.65; Hemoglobin 13.6; Platelets 240; Potassium 3.9; Sodium 139; TSH 2.090     Component Value Date/Time   CHOL 201 (H) 01/26/2022 0837   TRIG 92 01/26/2022 0837   TRIG 231 (H) 12/19/2013 1318   HDL 52 01/26/2022 0837   HDL 47 12/19/2013 1318   CHOLHDL 3.9 01/26/2022 0837   LDLCALC 132 (H) 01/26/2022 0837   LDLCALC 127 (H) 12/19/2013 1318    Other Studies Reviewed Today: Stress test from 2019 Evidence of ischemia  Assessment and Plan: Patient is a 66 year old F known to have HTN, HLD, prediabetes was referred to the cardiology clinic for evaluation of HTN and left atrial enlargement on EKG at the request of Bridget Pellant, NP.  #HTN, controlled #Muscle cramps Plan -Continue Lasix 20 mg once daily -Increase potassium supplements from 10 to 20 mEq due to muscle cramps. Obtain magnesium levels today to rule out hypomagnesemia.  #HLD, not at goal #Statin intolerance Plan -Patient tried 3 different types of statins in the past with no relief in the myalgias. She will benefit from bempedoic acid 180 mg once daily for ASCVD risk  prevention. LDL 132 in October 2023. Goal less than 100.  #Heart murmur, grade 2 to 3 / 6 Plan -I suspect the heart murmur could be from valvular regurgitation of mild severity. Obtain echo report from [facility].  I have spent a total of 45 minutes with patient reviewing chart, EKGs, labs and examining patient as well as establishing an assessment and plan that was discussed with the patient.  > 50% of time was spent in direct patient care.      Medication Adjustments/Labs and Tests Ordered: Current medicines are reviewed at length with the patient today.  Concerns regarding medicines are outlined above.   Tests Ordered: Orders Placed This Encounter  Procedures   Lipid panel   Magnesium    Medication Changes: Meds ordered this encounter  Medications   Bempedoic Acid 180 MG TABS    Sig: Take 180 mg by mouth daily.    Dispense:  30 tablet    Refill:  3    02/10/2022 NEW   potassium chloride SA (KLOR-CON M) 20 MEQ tablet    Sig: Take 1 tablet (20 mEq total) by mouth daily.    Dispense:  90 tablet    Refill:  3    02/10/2022 dose increase    Disposition:  Follow up  3 months  Signed Zannah Melucci Fidel Levy, MD, 02/10/2022 10:36 AM    [facility] [place], [place], [place] [address]

## 2022-02-10 NOTE — Patient Instructions (Addendum)
Medication Instructions:  Your physician has recommended you make the following change in your medication:  Start bempedoic acid 180 mg daily Increase potassium to 20 meq daily Continue other medications the same  Labwork: Magnesium level today at [facility] in 3 months just before your next visit. Non-fasting. Can be done at [facility]  Testing/Procedures: none  Follow-Up: Your physician recommends that you schedule a follow-up appointment in: 3 months  Any Other Special Instructions Will Be Listed Below (If Applicable).  If you need a refill on your cardiac medications before your next appointment, please call your pharmacy.

## 2022-02-12 ENCOUNTER — Encounter: Payer: Self-pay | Admitting: Nurse Practitioner

## 2022-02-14 ENCOUNTER — Other Ambulatory Visit: Payer: Self-pay | Admitting: *Deleted

## 2022-02-14 ENCOUNTER — Other Ambulatory Visit: Payer: Self-pay | Admitting: Nurse Practitioner

## 2022-02-14 DIAGNOSIS — Z79899 Other long term (current) drug therapy: Secondary | ICD-10-CM

## 2022-02-14 DIAGNOSIS — E785 Hyperlipidemia, unspecified: Secondary | ICD-10-CM

## 2022-02-14 MED ORDER — EZETIMIBE 10 MG PO TABS: 10.0000 mg | ORAL_TABLET | Freq: Every day | ORAL | 1 refills | Status: DC

## 2022-02-22 DIAGNOSIS — M546 Pain in thoracic spine: Secondary | ICD-10-CM | POA: Diagnosis not present

## 2022-02-22 DIAGNOSIS — M542 Cervicalgia: Secondary | ICD-10-CM | POA: Diagnosis not present

## 2022-02-22 DIAGNOSIS — M9902 Segmental and somatic dysfunction of thoracic region: Secondary | ICD-10-CM | POA: Diagnosis not present

## 2022-02-22 DIAGNOSIS — M9901 Segmental and somatic dysfunction of cervical region: Secondary | ICD-10-CM | POA: Diagnosis not present

## 2022-02-22 DIAGNOSIS — M47816 Spondylosis without myelopathy or radiculopathy, lumbar region: Secondary | ICD-10-CM | POA: Diagnosis not present

## 2022-02-22 DIAGNOSIS — M9903 Segmental and somatic dysfunction of lumbar region: Secondary | ICD-10-CM | POA: Diagnosis not present

## 2022-03-07 DIAGNOSIS — S8002XA Contusion of left knee, initial encounter: Secondary | ICD-10-CM | POA: Diagnosis not present

## 2022-03-07 DIAGNOSIS — S8001XA Contusion of right knee, initial encounter: Secondary | ICD-10-CM | POA: Diagnosis not present

## 2022-03-07 DIAGNOSIS — S46011A Strain of muscle(s) and tendon(s) of the rotator cuff of right shoulder, initial encounter: Secondary | ICD-10-CM | POA: Diagnosis not present

## 2022-03-07 DIAGNOSIS — M170 Bilateral primary osteoarthritis of knee: Secondary | ICD-10-CM | POA: Diagnosis not present

## 2022-03-10 ENCOUNTER — Other Ambulatory Visit: Payer: Self-pay | Admitting: Sports Medicine

## 2022-03-10 ENCOUNTER — Encounter: Payer: Self-pay | Admitting: Sports Medicine

## 2022-03-10 DIAGNOSIS — S46219A Strain of muscle, fascia and tendon of other parts of biceps, unspecified arm, initial encounter: Secondary | ICD-10-CM

## 2022-03-10 DIAGNOSIS — M75101 Unspecified rotator cuff tear or rupture of right shoulder, not specified as traumatic: Secondary | ICD-10-CM

## 2022-03-22 DIAGNOSIS — M9902 Segmental and somatic dysfunction of thoracic region: Secondary | ICD-10-CM | POA: Diagnosis not present

## 2022-03-22 DIAGNOSIS — M9901 Segmental and somatic dysfunction of cervical region: Secondary | ICD-10-CM | POA: Diagnosis not present

## 2022-03-22 DIAGNOSIS — M546 Pain in thoracic spine: Secondary | ICD-10-CM | POA: Diagnosis not present

## 2022-03-22 DIAGNOSIS — M9903 Segmental and somatic dysfunction of lumbar region: Secondary | ICD-10-CM | POA: Diagnosis not present

## 2022-03-22 DIAGNOSIS — M47816 Spondylosis without myelopathy or radiculopathy, lumbar region: Secondary | ICD-10-CM | POA: Diagnosis not present

## 2022-03-22 DIAGNOSIS — M542 Cervicalgia: Secondary | ICD-10-CM | POA: Diagnosis not present

## 2022-04-02 ENCOUNTER — Ambulatory Visit
Admission: RE | Admit: 2022-04-02 | Discharge: 2022-04-02 | Disposition: A | Discharge status: Home / Self Care | Payer: Medicare Other | Source: Ambulatory Visit | Attending: Sports Medicine | Admitting: Sports Medicine

## 2022-04-02 DIAGNOSIS — M25511 Pain in right shoulder: Secondary | ICD-10-CM | POA: Diagnosis not present

## 2022-04-02 DIAGNOSIS — S46219A Strain of muscle, fascia and tendon of other parts of biceps, unspecified arm, initial encounter: Secondary | ICD-10-CM

## 2022-04-02 DIAGNOSIS — M75101 Unspecified rotator cuff tear or rupture of right shoulder, not specified as traumatic: Secondary | ICD-10-CM

## 2022-04-07 DIAGNOSIS — S46011D Strain of muscle(s) and tendon(s) of the rotator cuff of right shoulder, subsequent encounter: Secondary | ICD-10-CM | POA: Diagnosis not present

## 2022-04-15 DIAGNOSIS — S46011D Strain of muscle(s) and tendon(s) of the rotator cuff of right shoulder, subsequent encounter: Secondary | ICD-10-CM | POA: Diagnosis not present

## 2022-04-28 DIAGNOSIS — M24111 Other articular cartilage disorders, right shoulder: Secondary | ICD-10-CM | POA: Diagnosis not present

## 2022-04-28 DIAGNOSIS — G8918 Other acute postprocedural pain: Secondary | ICD-10-CM | POA: Diagnosis not present

## 2022-04-28 DIAGNOSIS — M7521 Bicipital tendinitis, right shoulder: Secondary | ICD-10-CM | POA: Diagnosis not present

## 2022-04-28 DIAGNOSIS — M7551 Bursitis of right shoulder: Secondary | ICD-10-CM | POA: Diagnosis not present

## 2022-04-28 DIAGNOSIS — S46011A Strain of muscle(s) and tendon(s) of the rotator cuff of right shoulder, initial encounter: Secondary | ICD-10-CM | POA: Diagnosis not present

## 2022-04-28 DIAGNOSIS — S46811A Strain of other muscles, fascia and tendons at shoulder and upper arm level, right arm, initial encounter: Secondary | ICD-10-CM | POA: Diagnosis not present

## 2022-04-28 DIAGNOSIS — M7541 Impingement syndrome of right shoulder: Secondary | ICD-10-CM | POA: Diagnosis not present

## 2022-04-28 DIAGNOSIS — M19011 Primary osteoarthritis, right shoulder: Secondary | ICD-10-CM | POA: Diagnosis not present

## 2022-05-09 DIAGNOSIS — M19011 Primary osteoarthritis, right shoulder: Secondary | ICD-10-CM | POA: Diagnosis not present

## 2022-05-11 DIAGNOSIS — Z4789 Encounter for other orthopedic aftercare: Secondary | ICD-10-CM | POA: Diagnosis not present

## 2022-05-11 DIAGNOSIS — M6281 Muscle weakness (generalized): Secondary | ICD-10-CM | POA: Diagnosis not present

## 2022-05-11 DIAGNOSIS — M25511 Pain in right shoulder: Secondary | ICD-10-CM | POA: Diagnosis not present

## 2022-05-13 DIAGNOSIS — M25511 Pain in right shoulder: Secondary | ICD-10-CM | POA: Diagnosis not present

## 2022-05-13 DIAGNOSIS — Z4789 Encounter for other orthopedic aftercare: Secondary | ICD-10-CM | POA: Diagnosis not present

## 2022-05-13 DIAGNOSIS — M6281 Muscle weakness (generalized): Secondary | ICD-10-CM | POA: Diagnosis not present

## 2022-05-16 ENCOUNTER — Ambulatory Visit: Payer: Medicare Other | Admitting: Internal Medicine

## 2022-05-16 DIAGNOSIS — M25511 Pain in right shoulder: Secondary | ICD-10-CM | POA: Diagnosis not present

## 2022-05-16 DIAGNOSIS — Z4789 Encounter for other orthopedic aftercare: Secondary | ICD-10-CM | POA: Diagnosis not present

## 2022-05-16 DIAGNOSIS — M6281 Muscle weakness (generalized): Secondary | ICD-10-CM | POA: Diagnosis not present

## 2022-05-18 DIAGNOSIS — Z4789 Encounter for other orthopedic aftercare: Secondary | ICD-10-CM | POA: Diagnosis not present

## 2022-05-18 DIAGNOSIS — M25511 Pain in right shoulder: Secondary | ICD-10-CM | POA: Diagnosis not present

## 2022-05-18 DIAGNOSIS — M6281 Muscle weakness (generalized): Secondary | ICD-10-CM | POA: Diagnosis not present

## 2022-05-23 DIAGNOSIS — M6281 Muscle weakness (generalized): Secondary | ICD-10-CM | POA: Diagnosis not present

## 2022-05-23 DIAGNOSIS — M25511 Pain in right shoulder: Secondary | ICD-10-CM | POA: Diagnosis not present

## 2022-05-23 DIAGNOSIS — Z4789 Encounter for other orthopedic aftercare: Secondary | ICD-10-CM | POA: Diagnosis not present

## 2022-05-26 DIAGNOSIS — M6281 Muscle weakness (generalized): Secondary | ICD-10-CM | POA: Diagnosis not present

## 2022-05-26 DIAGNOSIS — Z4789 Encounter for other orthopedic aftercare: Secondary | ICD-10-CM | POA: Diagnosis not present

## 2022-05-26 DIAGNOSIS — M25511 Pain in right shoulder: Secondary | ICD-10-CM | POA: Diagnosis not present

## 2022-05-30 DIAGNOSIS — M25511 Pain in right shoulder: Secondary | ICD-10-CM | POA: Diagnosis not present

## 2022-05-30 DIAGNOSIS — Z4789 Encounter for other orthopedic aftercare: Secondary | ICD-10-CM | POA: Diagnosis not present

## 2022-05-30 DIAGNOSIS — M6281 Muscle weakness (generalized): Secondary | ICD-10-CM | POA: Diagnosis not present

## 2022-06-01 ENCOUNTER — Encounter: Payer: Self-pay | Admitting: Gastroenterology

## 2022-06-02 DIAGNOSIS — M25511 Pain in right shoulder: Secondary | ICD-10-CM | POA: Diagnosis not present

## 2022-06-02 DIAGNOSIS — Z4789 Encounter for other orthopedic aftercare: Secondary | ICD-10-CM | POA: Diagnosis not present

## 2022-06-02 DIAGNOSIS — M6281 Muscle weakness (generalized): Secondary | ICD-10-CM | POA: Diagnosis not present

## 2022-06-03 DIAGNOSIS — M9902 Segmental and somatic dysfunction of thoracic region: Secondary | ICD-10-CM | POA: Diagnosis not present

## 2022-06-03 DIAGNOSIS — M542 Cervicalgia: Secondary | ICD-10-CM | POA: Diagnosis not present

## 2022-06-03 DIAGNOSIS — M546 Pain in thoracic spine: Secondary | ICD-10-CM | POA: Diagnosis not present

## 2022-06-03 DIAGNOSIS — M47816 Spondylosis without myelopathy or radiculopathy, lumbar region: Secondary | ICD-10-CM | POA: Diagnosis not present

## 2022-06-03 DIAGNOSIS — M9903 Segmental and somatic dysfunction of lumbar region: Secondary | ICD-10-CM | POA: Diagnosis not present

## 2022-06-03 DIAGNOSIS — M9901 Segmental and somatic dysfunction of cervical region: Secondary | ICD-10-CM | POA: Diagnosis not present

## 2022-06-06 DIAGNOSIS — M19011 Primary osteoarthritis, right shoulder: Secondary | ICD-10-CM | POA: Diagnosis not present

## 2022-06-07 DIAGNOSIS — M25511 Pain in right shoulder: Secondary | ICD-10-CM | POA: Diagnosis not present

## 2022-06-08 DIAGNOSIS — R262 Difficulty in walking, not elsewhere classified: Secondary | ICD-10-CM | POA: Diagnosis not present

## 2022-06-08 DIAGNOSIS — M25561 Pain in right knee: Secondary | ICD-10-CM | POA: Diagnosis not present

## 2022-06-08 DIAGNOSIS — M170 Bilateral primary osteoarthritis of knee: Secondary | ICD-10-CM | POA: Diagnosis not present

## 2022-06-08 DIAGNOSIS — M25562 Pain in left knee: Secondary | ICD-10-CM | POA: Diagnosis not present

## 2022-06-09 ENCOUNTER — Other Ambulatory Visit: Payer: Medicare Other

## 2022-06-09 DIAGNOSIS — Z79899 Other long term (current) drug therapy: Secondary | ICD-10-CM | POA: Diagnosis not present

## 2022-06-09 DIAGNOSIS — M9901 Segmental and somatic dysfunction of cervical region: Secondary | ICD-10-CM | POA: Diagnosis not present

## 2022-06-09 DIAGNOSIS — M9903 Segmental and somatic dysfunction of lumbar region: Secondary | ICD-10-CM | POA: Diagnosis not present

## 2022-06-09 DIAGNOSIS — M9902 Segmental and somatic dysfunction of thoracic region: Secondary | ICD-10-CM | POA: Diagnosis not present

## 2022-06-09 DIAGNOSIS — E785 Hyperlipidemia, unspecified: Secondary | ICD-10-CM | POA: Diagnosis not present

## 2022-06-09 DIAGNOSIS — M25511 Pain in right shoulder: Secondary | ICD-10-CM | POA: Diagnosis not present

## 2022-06-09 DIAGNOSIS — M546 Pain in thoracic spine: Secondary | ICD-10-CM | POA: Diagnosis not present

## 2022-06-09 DIAGNOSIS — M542 Cervicalgia: Secondary | ICD-10-CM | POA: Diagnosis not present

## 2022-06-10 ENCOUNTER — Other Ambulatory Visit: Payer: Self-pay | Admitting: *Deleted

## 2022-06-10 ENCOUNTER — Telehealth: Payer: Self-pay | Admitting: *Deleted

## 2022-06-10 ENCOUNTER — Telehealth: Payer: Self-pay | Admitting: Internal Medicine

## 2022-06-10 ENCOUNTER — Encounter: Payer: Self-pay | Admitting: *Deleted

## 2022-06-10 LAB — LIPID PANEL
Chol/HDL Ratio: 5.2 ratio — ABNORMAL HIGH (ref 0.0–4.4)
Cholesterol, Total: 235 mg/dL — ABNORMAL HIGH (ref 100–199)
HDL: 45 mg/dL (ref >39)
LDL Chol Calc (NIH): 142 mg/dL — ABNORMAL HIGH (ref 0–99)
Triglycerides: 265 mg/dL — ABNORMAL HIGH (ref 0–149)
VLDL Cholesterol Cal: 48 mg/dL — ABNORMAL HIGH (ref 5–40)

## 2022-06-10 MED ORDER — REPATHA 140 MG/ML ~~LOC~~ SOSY: 140.0000 mg | PREFILLED_SYRINGE | SUBCUTANEOUS | 6 refills | Status: DC

## 2022-06-10 MED ORDER — REPATHA 140 MG/ML ~~LOC~~ SOSY: 140.0000 mg | PREFILLED_SYRINGE | SUBCUTANEOUS | 1 refills | Status: DC

## 2022-06-10 NOTE — Progress Notes (Signed)
Request Reference Number: [id number]. REPATHA INJ '140MG'$ /ML is approved through 12/11/2022. Your patient may now fill this prescription and it will be covered.. Authorization Expiration Date: December 11, 2022.  Faxed to [facility]

## 2022-06-10 NOTE — Telephone Encounter (Signed)
Patient informed and verbalized understanding of plan. Repatha sent to pharmacy Copy sent to PCP

## 2022-06-10 NOTE — Telephone Encounter (Signed)
Repatha prescription sent to [facility].

## 2022-06-10 NOTE — Telephone Encounter (Signed)
-----   Message from Chalmers Guest, MD sent at 06/10/2022  8:22 AM EST ----- LDL 142 despite on Zetia. Goal LDL<100. Has statin intolerance.  Did not qualify for bempedoic acid. Patient will benefit from PCSK-9 inhibitors (twice monthly) or Inclisiran (twice yearly) for primary prevention. Please start PCSK9 inhibitors or inclisiran, per patient's preference.

## 2022-06-10 NOTE — Telephone Encounter (Signed)
Spoke with Manuela Schwartz at [facility] in [place] who says repatha is on backorder from their warehouse and they would get it eventually.

## 2022-06-10 NOTE — Telephone Encounter (Signed)
Pt c/o medication issue:  1. Name of Medication:           Evolocumab (REPATHA) 140 MG/ML SOSY     2. How are you currently taking this medication (dosage and times per day)?   3. Are you having a reaction (difficulty breathing--STAT)?   4. What is your medication issue? Pt states she went to pharmacy for medication, however they are unable to fill. She states they told her [place] [facility] e-script is down because they got hacked and they are unable to process medication. Please advise, pt is not sure what she should do.Marland Kitchen]

## 2022-06-10 NOTE — Progress Notes (Signed)
Bridget [facility] (Key: [id number]) Rx #: [id number] Repatha '140MG'$ /ML syringes Form [employer] Medicare Part D Electronic Prior Authorization Form (2017 [place]CP[employer]) Created 35 minutes ago Sent to Plan 14 minutes ago Plan Response 12 minutes ago Submit Clinical Questions 3 minutes ago Determination Wait for Determination Please wait for [employer] 2017 NCPDP to return a determination.

## 2022-06-13 ENCOUNTER — Ambulatory Visit: Payer: Medicare Other | Admitting: Internal Medicine

## 2022-06-14 ENCOUNTER — Ambulatory Visit: Payer: Medicare Other | Attending: Internal Medicine | Admitting: Internal Medicine

## 2022-06-14 ENCOUNTER — Encounter: Payer: Self-pay | Admitting: Internal Medicine

## 2022-06-14 VITALS — BP 138/84 | HR 74 | Ht 65.0 in | Wt 266.2 lb

## 2022-06-14 DIAGNOSIS — M25511 Pain in right shoulder: Secondary | ICD-10-CM | POA: Diagnosis not present

## 2022-06-14 DIAGNOSIS — M7989 Other specified soft tissue disorders: Secondary | ICD-10-CM

## 2022-06-14 DIAGNOSIS — M79606 Pain in leg, unspecified: Secondary | ICD-10-CM

## 2022-06-14 NOTE — Progress Notes (Signed)
Cardiology Office Note  Date: 06/14/2022   ID: [name], [name] [birth year], MRN [id number]  PCP:  Claretta Fraise, MD  Cardiologist:  Chalmers Guest, MD Electrophysiologist:  None   Reason for Office Visit: Follow-up visit for HLD   History of Present Illness: Bridget Conway is a 67 y.o. female known to have HTN, prediabetes, HLD present to cardiology clinic for follow-up visit.  Patient was prior seen by [facility] cardiovascular in [place] when echo and stress test was performed in 2019. Echo report not available and stress test reviewed from 2019 which showed no evidence of ischemia. She does not have any symptoms of angina, DOE, dizziness, lightheadedness, syncope, palpitations but has heavy muscle pain in bilateral lower extremities present throughout the day. Zetia made the pain worse.  She stopped taking Zetia few weeks ago but the pain in her legs did not go away. Denied smoking cigarettes, alcohol use or illicit drug abuse. Family history is positive for heart attack in her dad and he died in his 23s, her sister had valve replacement and had a stroke.  Patient tried multiple statins (3 of them, unable to recall the names) with low and high doses and with decreased frequency, however she continued to have myalgias and hence statins were discontinued.  Has symptoms with Zetia as well.  Does not qualify for bempedoic acid due to no history of FH.   Past Medical History:  Diagnosis Date   Allergy    Anxiety    Arthritis    Atypical ductal hyperplasia of right breast    Cancer ([place])    basal cell CA- below eye   Depression    Family history of brain cancer    Family history of breast cancer    Family history of cervical cancer    Family history of colon cancer    Family history of lung cancer    Family history of melanoma    Family history of pancreatic cancer    Family history of uterine cancer    GERD (gastroesophageal reflux disease)    History of  kidney stones    History of nonmelanoma skin cancer    Hypertension    no meds, has white coat syndrome   IFG (impaired fasting glucose) 2014   Osteoporosis    Pre-diabetes    Wears glasses    Wears partial dentures    upper partial    Past Surgical History:  Procedure Laterality Date   ABDOMINAL HYSTERECTOMY     BREAST LUMPECTOMY WITH RADIOACTIVE SEED LOCALIZATION Right 12/27/2019   Procedure: RIGHT BREAST LUMPECTOMY WITH RADIOACTIVE SEED LOCALIZATION;  Surgeon: Jovita Kussmaul, MD;  Location: [facility];  Service: General;  Laterality: Right;   CARPAL TUNNEL RELEASE Left 07/11/2013   Procedure: LEFT CARPAL TUNNEL RELEASE;  Surgeon: Tennis Must, MD;  Location: [facility];  Service: Orthopedics;  Laterality: Left;   carpel tunnel  2005   right wrist   COLONOSCOPY     hysterectomy  2002   KNEE SURGERY Left    LAPAROSCOPIC GASTRIC BANDING  2010   LAPAROSCOPIC GASTRIC RESECTION N/A 12/28/2021   Procedure: LAPAROSCOPIC GASTRIC BAND REMOVAL;  Surgeon: Felicie Morn, MD;  Location: [facility];  Service: General;  Laterality: N/A;   ROTATOR CUFF REPAIR Right    TOTAL MASTECTOMY Bilateral 07/17/2020   Procedure: BILATERAL MASTECTOMY;  Surgeon: Jovita Kussmaul, MD;  Location: [facility];  Service: General;  Laterality: Bilateral;  Current Outpatient Medications  Medication Sig Dispense Refill   acetaminophen (TYLENOL) 650 MG CR tablet Take 1,300 mg by mouth every 8 (eight) hours as needed for pain.     Calcium Carb-Cholecalciferol (CALCIUM 600 + D PO) Take 1 tablet by mouth daily.     diphenhydrAMINE-zinc acetate (BENADRYL) cream Apply 1 Application topically 3 (three) times daily as needed for itching.     fluticasone (FLONASE) 50 MCG/ACT nasal spray Place 1 spray into both nostrils daily as needed for allergies. 11.1 mL 5   furosemide (LASIX) 20 MG tablet Take 20 mg by mouth daily.     GLUCOSAMINE-VITAMIN D PO Take 2 tablets by mouth  daily.     ibuprofen (ADVIL) 200 MG tablet Take 400 mg by mouth every 6 (six) hours as needed.     Melatonin 10 MG TABS Take 10 mg by mouth at bedtime as needed (sleep).     Multiple Vitamins-Minerals (MULTIVITAMIN PO) Take 1 tablet by mouth daily.     Omega 3-6-9 Fatty Acids (TRIPLE OMEGA-3-6-9) CAPS Take 2 capsules by mouth daily.     oxyCODONE (OXY IR/ROXICODONE) 5 MG immediate release tablet Take 5 mg by mouth 4 (four) times daily as needed.     potassium chloride SA (KLOR-CON M) 20 MEQ tablet Take 1 tablet (20 mEq total) by mouth daily. 90 tablet 3   Probiotic Product (PROBIOTIC DAILY PO) Take 1 capsule by mouth daily.     sertraline (ZOLOFT) 100 MG tablet Take 1 tablet bu mouth daily 90 tablet 0   TURMERIC PO Take 1,200 mg by mouth daily.     Evolocumab (REPATHA) 140 MG/ML SOSY Inject 140 mg into the skin every 14 (fourteen) days. (Patient not taking: Reported on 06/14/2022) 6.3 mL 1   ezetimibe (ZETIA) 10 MG tablet Take 1 tablet (10 mg total) by mouth daily. (Patient not taking: Reported on 06/14/2022) 90 tablet 1   No current facility-administered medications for this visit.   Allergies:  Lisinopril, Nsaids, Statins, Sulfa antibiotics, and Latex   Social History: The patient  reports that she quit smoking about 21 years ago. Her smoking use included cigarettes. She has never used smokeless tobacco. She reports that she does not drink alcohol and does not use drugs.   Family History: The patient's family history includes Brain cancer (age of onset: 71) in her cousin; Breast cancer (age of onset: 49) in her mother; Cervical cancer (age of onset: 34) in her sister; Colon cancer (age of onset: 35) in her cousin; Colon cancer (age of onset: 65) in her cousin; Heart attack in her father; Lung cancer (age of onset: 55) in her cousin; Lung cancer (age of onset: 34) in her cousin; Lung cancer (age of onset: 20) in her cousin; Melanoma (age of onset: 25) in her cousin; Pancreatic cancer (age of onset:  58) in her cousin; Uterine cancer (age of onset: 95) in her maternal aunt.   ROS:  Please see the history of present illness. Otherwise, complete review of systems is positive for none.  All other systems are reviewed and negative.   Physical Exam: VS:  BP 138/84 (BP Location: Left Arm, Patient Position: Sitting, Cuff Size: Large)   Pulse 74   Ht '5\' 5"'$  (1.651 m)   Wt 266 lb 3 oz (120.7 kg)   SpO2 98%   BMI 44.30 kg/m , BMI Body mass index is 44.3 kg/m.  Wt Readings from Last 3 Encounters:  06/14/22 266 lb 3 oz (120.7 kg)  02/10/22 255 lb (115.7 kg)  01/28/22 253 lb (114.8 kg)    General: Patient appears comfortable at rest. HEENT: Conjunctiva and lids normal, oropharynx clear with moist mucosa. Neck: Supple, no elevated JVP or carotid bruits, no thyromegaly. Lungs: Clear to auscultation, nonlabored breathing at rest. Cardiac: Regular rate and rhythm, Grade 2-3 systolic murmur Abdomen: Soft, nontender, no hepatomegaly, bowel sounds present, no guarding or rebound. Extremities: No pitting edema, distal pulses 2+. Skin: Warm and dry. Musculoskeletal: No kyphosis. Neuropsychiatric: Alert and oriented x3, affect grossly appropriate.  ECG:  An ECG dated 12/15/2021 was personally reviewed today and demonstrated:  NSR  Recent Labwork: 01/26/2022: ALT 17; AST 18; BUN 25; Creatinine, Ser 0.65; Hemoglobin 13.6; Platelets 240; Potassium 3.9; Sodium 139; TSH 2.090     Component Value Date/Time   CHOL 235 (H) 06/09/2022 1639   TRIG 265 (H) 06/09/2022 1639   TRIG 231 (H) 12/19/2013 1318   HDL 45 06/09/2022 1639   HDL 47 12/19/2013 1318   CHOLHDL 5.2 (H) 06/09/2022 1639   LDLCALC 142 (H) 06/09/2022 1639   LDLCALC 127 (H) 12/19/2013 1318    Other Studies Reviewed Today: Stress test from 2019 No evidence of ischemia  Assessment and Plan: Patient is a 67 year old F known to have HTN, HLD, prediabetes presented to clinic for follow-up visit.  #HTN, controlled Plan -Continue Lasix 20  mg once daily -Potassium supplements  #HLD, not at goal #Statin intolerance Plan -Patient tried 3 different types of statins in the past with no relief in the myalgias.  Zetia worsened her symptoms. Does not qualify for bempedoic acid due to no history of FH (per insurance). Started Repatha every 2 weeks for primary prevention of CAD.  Goal LDL less than 100.  # Heart murmur, grade 2 to 3 / 6 # DOE Plan -Obtain 2D echocardiogram  # Bilateral lower extremity pain, rule out PAD -Obtain ABI with ultrasound arterial Doppler lower extremities  I have spent a total of 33 minutes with patient reviewing chart, EKGs, labs and examining patient as well as establishing an assessment and plan that was discussed with the patient.  > 50% of time was spent in direct patient care.      Medication Adjustments/Labs and Tests Ordered: Current medicines are reviewed at length with the patient today.  Concerns regarding medicines are outlined above.   Tests Ordered: Orders Placed This Encounter  Procedures   ECHOCARDIOGRAM COMPLETE   VAS [place] ABI WITH/WO TBI    Medication Changes: No orders of the defined types were placed in this encounter.   Disposition:  Follow up  10 months  Signed Saffron Busey Fidel Levy, MD, 06/14/2022 12:29 PM    [facility], [place], [place] [address]

## 2022-06-14 NOTE — Patient Instructions (Addendum)
Medication Instructions:  Your physician recommends that you continue on your current medications as directed. Please refer to the Current Medication list given to you today.  Labwork: none  Testing/Procedures: Your physician has requested that you have an echocardiogram. Echocardiography is a painless test that uses sound waves to create images of your heart. It provides your doctor with information about the size and shape of your heart and how well your heart's chambers and valves are working. This procedure takes approximately one hour. There are no restrictions for this procedure. Please do NOT wear cologne, perfume, aftershave, or lotions (deodorant is allowed). Please arrive 15 minutes prior to your appointment time. Your physician has requested that you have an ankle brachial index (ABI). During this test an ultrasound and blood pressure cuff are used to evaluate the arteries that supply the arms and legs with blood. Allow thirty minutes for this exam. There are no restrictions or special instructions.  Follow-Up: Your physician recommends that you schedule a follow-up appointment in: 10 months. You will receive a reminder call in the mail in about 6 months reminding you to call and schedule your appointment. If you don't receive this call, please contact our office.  Any Other Special Instructions Will Be Listed Below (If Applicable).  If you need a refill on your cardiac medications before your next appointment, please call your pharmacy.

## 2022-06-15 ENCOUNTER — Other Ambulatory Visit: Payer: Self-pay | Admitting: Internal Medicine

## 2022-06-15 DIAGNOSIS — M79606 Pain in leg, unspecified: Secondary | ICD-10-CM

## 2022-06-15 DIAGNOSIS — M7989 Other specified soft tissue disorders: Secondary | ICD-10-CM

## 2022-06-15 DIAGNOSIS — I739 Peripheral vascular disease, unspecified: Secondary | ICD-10-CM

## 2022-06-15 DIAGNOSIS — R262 Difficulty in walking, not elsewhere classified: Secondary | ICD-10-CM | POA: Diagnosis not present

## 2022-06-15 DIAGNOSIS — M25562 Pain in left knee: Secondary | ICD-10-CM | POA: Diagnosis not present

## 2022-06-15 DIAGNOSIS — M25561 Pain in right knee: Secondary | ICD-10-CM | POA: Diagnosis not present

## 2022-06-15 DIAGNOSIS — R011 Cardiac murmur, unspecified: Secondary | ICD-10-CM

## 2022-06-15 DIAGNOSIS — R0609 Other forms of dyspnea: Secondary | ICD-10-CM

## 2022-06-15 DIAGNOSIS — M170 Bilateral primary osteoarthritis of knee: Secondary | ICD-10-CM | POA: Diagnosis not present

## 2022-06-21 DIAGNOSIS — M25511 Pain in right shoulder: Secondary | ICD-10-CM | POA: Diagnosis not present

## 2022-06-22 DIAGNOSIS — L570 Actinic keratosis: Secondary | ICD-10-CM | POA: Diagnosis not present

## 2022-06-22 DIAGNOSIS — M25562 Pain in left knee: Secondary | ICD-10-CM | POA: Diagnosis not present

## 2022-06-22 DIAGNOSIS — M170 Bilateral primary osteoarthritis of knee: Secondary | ICD-10-CM | POA: Diagnosis not present

## 2022-06-22 DIAGNOSIS — M25561 Pain in right knee: Secondary | ICD-10-CM | POA: Diagnosis not present

## 2022-06-22 DIAGNOSIS — R262 Difficulty in walking, not elsewhere classified: Secondary | ICD-10-CM | POA: Diagnosis not present

## 2022-06-22 DIAGNOSIS — Z85828 Personal history of other malignant neoplasm of skin: Secondary | ICD-10-CM | POA: Diagnosis not present

## 2022-06-23 DIAGNOSIS — M25511 Pain in right shoulder: Secondary | ICD-10-CM | POA: Diagnosis not present

## 2022-06-27 DIAGNOSIS — M25511 Pain in right shoulder: Secondary | ICD-10-CM | POA: Diagnosis not present

## 2022-06-29 DIAGNOSIS — M25511 Pain in right shoulder: Secondary | ICD-10-CM | POA: Diagnosis not present

## 2022-06-29 DIAGNOSIS — R262 Difficulty in walking, not elsewhere classified: Secondary | ICD-10-CM | POA: Diagnosis not present

## 2022-06-29 DIAGNOSIS — M170 Bilateral primary osteoarthritis of knee: Secondary | ICD-10-CM | POA: Diagnosis not present

## 2022-06-29 DIAGNOSIS — M25562 Pain in left knee: Secondary | ICD-10-CM | POA: Diagnosis not present

## 2022-06-29 DIAGNOSIS — M25561 Pain in right knee: Secondary | ICD-10-CM | POA: Diagnosis not present

## 2022-06-30 ENCOUNTER — Ambulatory Visit: Payer: Medicare Other | Attending: Internal Medicine

## 2022-06-30 ENCOUNTER — Ambulatory Visit (INDEPENDENT_AMBULATORY_CARE_PROVIDER_SITE_OTHER): Payer: Medicare Other

## 2022-06-30 DIAGNOSIS — R011 Cardiac murmur, unspecified: Secondary | ICD-10-CM

## 2022-06-30 DIAGNOSIS — M79606 Pain in leg, unspecified: Secondary | ICD-10-CM

## 2022-06-30 DIAGNOSIS — R0609 Other forms of dyspnea: Secondary | ICD-10-CM | POA: Diagnosis not present

## 2022-06-30 DIAGNOSIS — I739 Peripheral vascular disease, unspecified: Secondary | ICD-10-CM

## 2022-06-30 DIAGNOSIS — M7989 Other specified soft tissue disorders: Secondary | ICD-10-CM

## 2022-06-30 LAB — VAS US ABI WITH/WO TBI
Left ABI: 1.08
Right ABI: 1.1

## 2022-06-30 LAB — ECHOCARDIOGRAM COMPLETE
AR max vel: 1.36 cm2
AV Area VTI: 1.5 cm2
AV Area mean vel: 1.47 cm2
AV Mean grad: 12.2 mmHg
AV Peak grad: 21.4 mmHg
Ao pk vel: 2.31 m/s
Area-P 1/2: 3.06 cm2
Calc EF: 66.4 %
MV M vel: 5.03 m/s
MV Peak grad: 101.2 mmHg
S' Lateral: 2.8 cm
Single Plane A2C EF: 58.3 %
Single Plane A4C EF: 75 %

## 2022-07-06 DIAGNOSIS — M170 Bilateral primary osteoarthritis of knee: Secondary | ICD-10-CM | POA: Diagnosis not present

## 2022-07-06 DIAGNOSIS — M25562 Pain in left knee: Secondary | ICD-10-CM | POA: Diagnosis not present

## 2022-07-06 DIAGNOSIS — R262 Difficulty in walking, not elsewhere classified: Secondary | ICD-10-CM | POA: Diagnosis not present

## 2022-07-06 DIAGNOSIS — M25561 Pain in right knee: Secondary | ICD-10-CM | POA: Diagnosis not present

## 2022-08-03 DIAGNOSIS — M19011 Primary osteoarthritis, right shoulder: Secondary | ICD-10-CM | POA: Diagnosis not present

## 2022-08-16 ENCOUNTER — Ambulatory Visit (INDEPENDENT_AMBULATORY_CARE_PROVIDER_SITE_OTHER): Payer: Medicare Other | Admitting: Family Medicine

## 2022-08-16 ENCOUNTER — Encounter: Payer: Self-pay | Admitting: Family Medicine

## 2022-08-16 VITALS — BP 169/87 | HR 82 | Temp 96.8°F | Ht 65.0 in | Wt 262.0 lb

## 2022-08-16 DIAGNOSIS — E559 Vitamin D deficiency, unspecified: Secondary | ICD-10-CM

## 2022-08-16 DIAGNOSIS — M7918 Myalgia, other site: Secondary | ICD-10-CM

## 2022-08-16 DIAGNOSIS — M2550 Pain in unspecified joint: Secondary | ICD-10-CM | POA: Diagnosis not present

## 2022-08-16 DIAGNOSIS — S30861A Insect bite (nonvenomous) of abdominal wall, initial encounter: Secondary | ICD-10-CM

## 2022-08-16 DIAGNOSIS — W57XXXA Bitten or stung by nonvenomous insect and other nonvenomous arthropods, initial encounter: Secondary | ICD-10-CM | POA: Diagnosis not present

## 2022-08-16 MED ORDER — PREDNISONE 20 MG PO TABS: 40.0000 mg | ORAL_TABLET | Freq: Every day | ORAL | 0 refills | Status: AC

## 2022-08-16 NOTE — Progress Notes (Signed)
Subjective:  Patient ID: Bridget Conway, female    DOB: [birth year], 67 y.o.   MRN: [id number]  Patient Care Team: Mechele Claude, MD as PCP - General (Family Medicine) Mallipeddi, Orion Modest, MD as PCP - Cardiology (Cardiology) Serena Croissant, MD as Consulting Physician (Hematology and Oncology) Marcelino Duster, MD as Referring Physician (Dermatology)   Chief Complaint:  Generalized Body Aches (Patient states she has had some body aches for years but has got severe over the last month where she can not get out the bed.  Patient states she has had several tick bites. )   HPI: Bridget Conway is a 67 y.o. female presenting on 08/16/2022 for Generalized Body Aches (Patient states she has had some body aches for years but has got severe over the last month where she can not get out the bed.  Patient states she has had several tick bites. )   Pt presents today for evaluation of ongoing and worsening arthralgias and myalgias over the last month. States he has had myalgias for years but this has become significantly worse over the last month. She did remove several ticks over the last several days, one from her abdomen that did have a red rash for a few days. This has resolved. No associated fevers or systemic rash. She has know arthritis but feels this has continued to worsen. She states she has fatigue and her activity has decreased due to the daily pain.     Relevant past medical, surgical, family, and social history reviewed and updated as indicated.  Allergies and medications reviewed and updated. Data reviewed: Chart in Epic.   Past Medical History:  Diagnosis Date   Allergy    Anxiety    Arthritis    Atypical ductal hyperplasia of right breast    Cancer (HCC)    basal cell CA- below eye   Depression    Family history of brain cancer    Family history of breast cancer    Family history of cervical cancer    Family history of colon cancer    Family history of lung cancer     Family history of melanoma    Family history of pancreatic cancer    Family history of uterine cancer    GERD (gastroesophageal reflux disease)    History of kidney stones    History of nonmelanoma skin cancer    Hypertension    no meds, has white coat syndrome   IFG (impaired fasting glucose) 2014   Osteoporosis    Pre-diabetes    Wears glasses    Wears partial dentures    upper partial    Past Surgical History:  Procedure Laterality Date   ABDOMINAL HYSTERECTOMY     BREAST LUMPECTOMY WITH RADIOACTIVE SEED LOCALIZATION Right 12/27/2019   Procedure: RIGHT BREAST LUMPECTOMY WITH RADIOACTIVE SEED LOCALIZATION;  Surgeon: Griselda Miner, MD;  Location: [facility];  Service: General;  Laterality: Right;   CARPAL TUNNEL RELEASE Left 07/11/2013   Procedure: LEFT CARPAL TUNNEL RELEASE;  Surgeon: Tami Ribas, MD;  Location: [facility];  Service: Orthopedics;  Laterality: Left;   carpel tunnel  2005   right wrist   COLONOSCOPY     hysterectomy  2002   KNEE SURGERY Left    LAPAROSCOPIC GASTRIC BANDING  2010   LAPAROSCOPIC GASTRIC RESECTION N/A 12/28/2021   Procedure: LAPAROSCOPIC GASTRIC BAND REMOVAL;  Surgeon: Quentin Ore, MD;  Location: [facility];  Service: General;  Laterality: N/A;   ROTATOR CUFF REPAIR Right    TOTAL MASTECTOMY Bilateral 07/17/2020   Procedure: BILATERAL MASTECTOMY;  Surgeon: Griselda Miner, MD;  Location: [facility];  Service: General;  Laterality: Bilateral;    Social History   Socioeconomic History   Marital status: Divorced    Spouse name: Not on file   Number of children: 1   Years of education: Not on file   Highest education level: Some college, no degree  Occupational History   Occupation: retired  Tobacco Use   Smoking status: Former    Types: Cigarettes    Quit date: 07/09/2000    Years since quitting: 22.1   Smokeless tobacco: Never  Vaping Use   Vaping Use: Never used  Substance and  Sexual Activity   Alcohol use: No   Drug use: No   Sexual activity: Not on file  Other Topics Concern   Not on file  Social History Narrative   Lives with significant other   Double mastectomy in 07/2020 - Dr Carolynne Edouard   Routine visits with Dr Pamelia Hoit, Oncologist   Social Determinants of Health   Financial Resource Strain: Medium Risk (08/12/2022)   Overall Financial Resource Strain (CARDIA)    Difficulty of Paying Living Expenses: Somewhat hard  Food Insecurity: No Food Insecurity (08/12/2022)   Hunger Vital Sign    Worried About Running Out of Food in the Last Year: Never true    Ran Out of Food in the Last Year: Never true  Transportation Needs: No Transportation Needs (08/12/2022)   PRAPARE - Administrator, Civil Service (Medical): No    Lack of Transportation (Non-Medical): No  Physical Activity: Inactive (08/12/2022)   Exercise Vital Sign    Days of Exercise per Week: 0 days    Minutes of Exercise per Session: 20 min  Stress: Stress Concern Present (08/12/2022)   [employer] of Occupational Health - Occupational Stress Questionnaire    Feeling of Stress : Rather much  Social Connections: Moderately Isolated (08/12/2022)   Social Connection and Isolation Panel [NHANES]    Frequency of Communication with Friends and Family: More than three times a week    Frequency of Social Gatherings with Friends and Family: Twice a week    Attends Religious Services: Never    Database administrator or Organizations: Yes    Attends Engineer, structural: More than 4 times per year    Marital Status: Divorced  Intimate Partner Violence: Not At Risk (09/10/2021)   Humiliation, Afraid, Rape, and Kick questionnaire    Fear of Current or Ex-Partner: No    Emotionally Abused: No    Physically Abused: No    Sexually Abused: No    Outpatient Encounter Medications as of 08/16/2022  Medication Sig   acetaminophen (TYLENOL) 650 MG CR tablet Take 1,300 mg by mouth every 8 (eight) hours  as needed for pain.   Calcium Carb-Cholecalciferol (CALCIUM 600 + D PO) Take 1 tablet by mouth daily.   diphenhydrAMINE-zinc acetate (BENADRYL) cream Apply 1 Application topically 3 (three) times daily as needed for itching.   fluticasone (FLONASE) 50 MCG/ACT nasal spray Place 1 spray into both nostrils daily as needed for allergies.   furosemide (LASIX) 20 MG tablet Take 20 mg by mouth daily.   GLUCOSAMINE-VITAMIN D PO Take 2 tablets by mouth daily.   ibuprofen (ADVIL) 200 MG tablet Take 400 mg by mouth every 6 (six) hours as needed.   Melatonin 10  MG TABS Take 10 mg by mouth at bedtime as needed (sleep).   Multiple Vitamins-Minerals (MULTIVITAMIN PO) Take 1 tablet by mouth daily.   Omega 3-6-9 Fatty Acids (TRIPLE OMEGA-3-6-9) CAPS Take 2 capsules by mouth daily.   potassium chloride SA (KLOR-CON M) 20 MEQ tablet Take 1 tablet (20 mEq total) by mouth daily.   predniSONE (DELTASONE) 20 MG tablet Take 2 tablets (40 mg total) by mouth daily with breakfast for 5 days. 2 po daily for 5 days   Probiotic Product (PROBIOTIC DAILY PO) Take 1 capsule by mouth daily.   sertraline (ZOLOFT) 100 MG tablet Take 1 tablet bu mouth daily   TURMERIC PO Take 1,200 mg by mouth daily.   Evolocumab (REPATHA) 140 MG/ML SOSY Inject 140 mg into the skin every 14 (fourteen) days. (Patient not taking: Reported on 06/14/2022)   oxyCODONE (OXY IR/ROXICODONE) 5 MG immediate release tablet Take 5 mg by mouth 4 (four) times daily as needed. (Patient not taking: Reported on 08/16/2022)   No facility-administered encounter medications on file as of 08/16/2022.    Allergies  Allergen Reactions   Lisinopril Cough   Nsaids     Fluid retention    Statins     Muscle pain   Sulfa Antibiotics Hives   Latex Rash    Review of Systems  Constitutional:  Positive for activity change, appetite change and fatigue. Negative for chills, diaphoresis, fever and unexpected weight change.  HENT: Negative.    Eyes: Negative.  Negative for  photophobia and visual disturbance.  Respiratory:  Negative for cough, chest tightness and shortness of breath.   Cardiovascular:  Negative for chest pain, palpitations and leg swelling.  Gastrointestinal:  Negative for abdominal pain, blood in stool, constipation, diarrhea, nausea and vomiting.  Endocrine: Negative.  Negative for cold intolerance, heat intolerance, polydipsia, polyphagia and polyuria.  Genitourinary:  Negative for decreased urine volume, difficulty urinating, dysuria, frequency and urgency.  Musculoskeletal:  Positive for arthralgias, back pain, gait problem and myalgias. Negative for joint swelling and neck stiffness.  Skin:  Positive for rash (on abdomen from tick bite, resolved).  Allergic/Immunologic: Negative.   Neurological:  Negative for dizziness, tremors, seizures, syncope, facial asymmetry, speech difficulty, weakness, light-headedness, numbness and headaches.  Hematological: Negative.   Psychiatric/Behavioral:  Negative for confusion, hallucinations, sleep disturbance and suicidal ideas.   All other systems reviewed and are negative.       Objective:  BP (!) 169/87   Pulse 82   Temp (!) 96.8 F (36 C) (Temporal)   Ht 5\' 5"  (1.651 m)   Wt 262 lb (118.8 kg)   SpO2 94%   BMI 43.60 kg/m    Wt Readings from Last 3 Encounters:  08/16/22 262 lb (118.8 kg)  06/14/22 266 lb 3 oz (120.7 kg)  02/10/22 255 lb (115.7 kg)    Physical Exam Vitals and nursing note reviewed.  Constitutional:      General: She is not in acute distress.    Appearance: Normal appearance. She is well-developed and well-groomed. She is morbidly obese. She is not ill-appearing, toxic-appearing or diaphoretic.  HENT:     Head: Normocephalic and atraumatic.     Jaw: There is normal jaw occlusion.     Right Ear: Hearing normal.     Left Ear: Hearing normal.     Nose: Nose normal.     Mouth/Throat:     Lips: Pink.     Mouth: Mucous membranes are moist.     Pharynx: Oropharynx is  clear. Uvula midline.  Eyes:     General: Lids are normal.     Extraocular Movements: Extraocular movements intact.     Conjunctiva/sclera: Conjunctivae normal.     Pupils: Pupils are equal, round, and reactive to light.  Neck:     Thyroid: No thyroid mass, thyromegaly or thyroid tenderness.     Trachea: Trachea and phonation normal.  Cardiovascular:     Rate and Rhythm: Normal rate and regular rhythm.     Chest Wall: PMI is not displaced.     Pulses: Normal pulses.     Heart sounds: Normal heart sounds. No murmur heard.    No friction rub. No gallop.  Pulmonary:     Effort: Pulmonary effort is normal. No respiratory distress.     Breath sounds: Normal breath sounds. No wheezing.  Abdominal:     General: Bowel sounds are normal. There is no distension or abdominal bruit.     Palpations: Abdomen is soft. There is no hepatomegaly or splenomegaly.     Tenderness: There is no abdominal tenderness. There is no right CVA tenderness or left CVA tenderness.     Hernia: No hernia is present.  Musculoskeletal:     Cervical back: Normal range of motion and neck supple.     Right lower leg: Edema present.     Left lower leg: Edema present.  Skin:    General: Skin is warm and dry.     Capillary Refill: Capillary refill takes less than 2 seconds.     Coloration: Skin is not cyanotic, jaundiced or pale.     Findings: No rash.  Neurological:     General: No focal deficit present.     Mental Status: She is alert and oriented to person, place, and time.     Sensory: Sensation is intact.     Motor: Motor function is intact.     Coordination: Coordination is intact.     Gait: Gait abnormal (antalgic).     Deep Tendon Reflexes: Reflexes are normal and symmetric.  Psychiatric:        Attention and Perception: Attention and perception normal.        Mood and Affect: Mood and affect normal.        Speech: Speech normal.        Behavior: Behavior normal. Behavior is cooperative.        Thought  Content: Thought content normal.        Cognition and Memory: Cognition and memory normal.        Judgment: Judgment normal.     Results for orders placed or performed in visit on 06/30/22  VAS [place] ABI WITH/WO TBI  Result Value Ref Range   Right ABI 1.10    Left ABI 1.08        Pertinent labs & imaging results that were available during my care of the patient were reviewed by me and considered in my medical decision making.  Assessment & Plan:  Yaheli was seen today for generalized body aches.  Diagnoses and all orders for this visit:  Myalgia, multiple sites Arthralgia of multiple sites Will check for potential underlying causes of worsening symptoms such as RA, SLE, thyroid dysfunction, muscle damage from Repatha, Vit D deficiencies, or tick borne illnesses.  -     Anemia Profile B -     CMP14+EGFR -     Thyroid Panel With TSH -     VITAMIN D 25 Hydroxy (Vit-D Deficiency, Fractures) -  Lyme Disease Serology w/Reflex -     Spotted Fever Group Antibodies -     ANA Comprehensive Panel -     predniSONE (DELTASONE) 20 MG tablet; Take 2 tablets (40 mg total) by mouth daily with breakfast for 5 days. 2 po daily for 5 days -     CK isoenzymes (brain, muscle injury)  Tick bite of abdomen, initial encounter Will check and treat if warranted.  -     Lyme Disease Serology w/Reflex -     Spotted Fever Group Antibodies  Vitamin D deficiency Labs pending. Continue repletion therapy. If indicated, will change repletion dosage. Eat foods rich in Vit D including milk, orange juice, yogurt with vitamin D added, salmon or mackerel, canned tuna fish, cereals with vitamin D added, and cod liver oil. Get out in the sun but make sure to wear at least SPF 30 sunscreen.  -     VITAMIN D 25 Hydroxy (Vit-D Deficiency, Fractures)     Continue all other maintenance medications.  Follow up plan: Return in about 4 weeks (around 09/13/2022), or if symptoms worsen or fail to improve, for  arthralgias.   Continue healthy lifestyle choices, including diet (rich in fruits, vegetables, and lean proteins, and low in salt and simple carbohydrates) and exercise (at least 30 minutes of moderate physical activity daily).  Educational handout given for muscle pain, arthritis  The above assessment and management plan was discussed with the patient. The patient verbalized understanding of and has agreed to the management plan. Patient is aware to call the clinic if they develop any new symptoms or if symptoms persist or worsen. Patient is aware when to return to the clinic for a follow-up visit. Patient educated on when it is appropriate to go to the emergency department.   Kari Baars, FNP-C [facility] [phone]

## 2022-08-19 LAB — CK ISOENZYMES
CK-BB: 0 %
CK-MM: 100 % (ref 97–100)
Macro Type 2: 0 %
Total CK: 65 U/L (ref 32–182)

## 2022-08-23 LAB — ANA COMPREHENSIVE PANEL
Anti JO-1: <0.2 AI (ref 0.0–0.9)
Centromere Ab Screen: <0.2 AI (ref 0.0–0.9)
Chromatin Ab SerPl-aCnc: <0.2 AI (ref 0.0–0.9)
ENA RNP Ab: <0.2 AI (ref 0.0–0.9)
ENA SM Ab Ser-aCnc: <0.2 AI (ref 0.0–0.9)
ENA SSA (RO) Ab: <0.2 AI (ref 0.0–0.9)
ENA SSB (LA) Ab: <0.2 AI (ref 0.0–0.9)
Scleroderma (Scl-70) (ENA) Antibody, IgG: <0.2 AI (ref 0.0–0.9)
dsDNA Ab: <1 IU/mL (ref 0–9)

## 2022-08-23 LAB — SPOTTED FEVER GROUP ANTIBODIES
Spotted Fever Group IgG: <1:64 {titer}
Spotted Fever Group IgM: <1:64 {titer}

## 2022-08-23 LAB — CMP14+EGFR
ALT: 16 IU/L (ref 0–32)
AST: 17 IU/L (ref 0–40)
Albumin/Globulin Ratio: 1.4 (ref 1.2–2.2)
Albumin: 4.3 g/dL (ref 3.9–4.9)
Alkaline Phosphatase: 76 IU/L (ref 44–121)
BUN/Creatinine Ratio: 26 (ref 12–28)
BUN: 21 mg/dL (ref 8–27)
Bilirubin Total: 0.4 mg/dL (ref 0.0–1.2)
CO2: 24 mmol/L (ref 20–29)
Calcium: 9.2 mg/dL (ref 8.7–10.3)
Chloride: 103 mmol/L (ref 96–106)
Creatinine, Ser: 0.81 mg/dL (ref 0.57–1.00)
Globulin, Total: 3 g/dL (ref 1.5–4.5)
Glucose: 92 mg/dL (ref 70–99)
Potassium: 3.9 mmol/L (ref 3.5–5.2)
Sodium: 143 mmol/L (ref 134–144)
Total Protein: 7.3 g/dL (ref 6.0–8.5)
eGFR: 79 mL/min/{1.73_m2} (ref >59)

## 2022-08-23 LAB — ANEMIA PROFILE B
Basophils Absolute: 0 10*3/uL (ref 0.0–0.2)
Basos: 1 %
EOS (ABSOLUTE): 0.1 10*3/uL (ref 0.0–0.4)
Eos: 2 %
Ferritin: 113 ng/mL (ref 15–150)
Folate: 16.1 ng/mL (ref >3.0)
Hematocrit: 42.5 % (ref 34.0–46.6)
Hemoglobin: 14 g/dL (ref 11.1–15.9)
Immature Grans (Abs): 0 10*3/uL (ref 0.0–0.1)
Immature Granulocytes: 0 %
Iron Saturation: 38 % (ref 15–55)
Iron: 119 ug/dL (ref 27–139)
Lymphocytes Absolute: 1.8 10*3/uL (ref 0.7–3.1)
Lymphs: 34 %
MCH: 32.2 pg (ref 26.6–33.0)
MCHC: 32.9 g/dL (ref 31.5–35.7)
MCV: 98 fL — ABNORMAL HIGH (ref 79–97)
Monocytes Absolute: 0.5 10*3/uL (ref 0.1–0.9)
Monocytes: 9 %
Neutrophils Absolute: 2.8 10*3/uL (ref 1.4–7.0)
Neutrophils: 54 %
Platelets: 252 10*3/uL (ref 150–450)
RBC: 4.35 x10E6/uL (ref 3.77–5.28)
RDW: 12.3 % (ref 11.7–15.4)
Retic Ct Pct: 2.2 % (ref 0.6–2.6)
Total Iron Binding Capacity: 311 ug/dL (ref 250–450)
UIBC: 192 ug/dL (ref 118–369)
Vitamin B-12: 413 pg/mL (ref 232–1245)
WBC: 5.2 10*3/uL (ref 3.4–10.8)

## 2022-08-23 LAB — LYME DISEASE SEROLOGY W/REFLEX: Lyme Total Antibody EIA: NEGATIVE

## 2022-08-23 LAB — CK ISOENZYMES
CK-MB: 0 % (ref 0–3)
Macro Type 1: 0 %

## 2022-08-23 LAB — THYROID PANEL WITH TSH
Free Thyroxine Index: 2.3 (ref 1.2–4.9)
T3 Uptake Ratio: 28 % (ref 24–39)
T4, Total: 8.1 ug/dL (ref 4.5–12.0)
TSH: 2.27 u[IU]/mL (ref 0.450–4.500)

## 2022-08-23 LAB — VITAMIN D 25 HYDROXY (VIT D DEFICIENCY, FRACTURES): Vit D, 25-Hydroxy: 39.7 ng/mL (ref 30.0–100.0)

## 2022-09-12 ENCOUNTER — Ambulatory Visit (INDEPENDENT_AMBULATORY_CARE_PROVIDER_SITE_OTHER): Payer: Medicare Other

## 2022-09-12 VITALS — Ht 65.0 in | Wt 247.0 lb

## 2022-09-12 DIAGNOSIS — Z0000 Encounter for general adult medical examination without abnormal findings: Secondary | ICD-10-CM

## 2022-09-12 DIAGNOSIS — Z1211 Encounter for screening for malignant neoplasm of colon: Secondary | ICD-10-CM

## 2022-09-12 NOTE — Progress Notes (Signed)
Subjective:   Bridget Conway is a 67 y.o. female who presents for Medicare Annual (Subsequent) preventive examination. I connected with  Bridget Conway on 09/12/22 by a audio enabled telemedicine application and verified that I am speaking with the correct person using two identifiers.  Patient Location: Home  Provider Location: Home Office  I discussed the limitations of evaluation and management by telemedicine. The patient expressed understanding and agreed to proceed.  Review of Systems     Cardiac Risk Factors include: advanced age (>61men, >64 women);dyslipidemia     Objective:    Today's Vitals   09/12/22 1454  Weight: 247 lb (112 kg)  Height: 5\' 5"  (1.651 m)   Body mass index is 41.1 kg/m.     09/12/2022    2:57 PM 12/15/2021    9:52 AM 09/10/2021    2:52 PM 09/09/2020    3:12 PM 07/17/2020    7:39 AM 07/06/2020    2:36 PM 12/27/2019    8:41 AM  Advanced Directives  Does Patient Have a Medical Advance Directive? Yes Yes Yes Yes Yes Yes No  Type of Estate agent of [place];Living will Healthcare Power of [place];Living will Healthcare Power of [place];Living will Healthcare Power of [place];Living will Healthcare Power of [place];Living will Healthcare Power of [place];Living will   Copy of Healthcare Power of Attorney in Chart? No - copy requested No - copy requested No - copy requested No - copy requested No - copy requested    Would patient like information on creating a medical advance directive?       Yes (MAU/Ambulatory/Procedural Areas - Information given)    Current Medications (verified) Outpatient Encounter Medications as of 09/12/2022  Medication Sig   acetaminophen (TYLENOL) 650 MG CR tablet Take 1,300 mg by mouth every 8 (eight) hours as needed for pain.   Calcium Carb-Cholecalciferol (CALCIUM 600 + D PO) Take 1 tablet by mouth daily.   diphenhydrAMINE-zinc acetate (BENADRYL) cream Apply 1 Application topically 3 (three) times  daily as needed for itching.   fluticasone (FLONASE) 50 MCG/ACT nasal spray Place 1 spray into both nostrils daily as needed for allergies.   furosemide (LASIX) 20 MG tablet Take 20 mg by mouth daily.   GLUCOSAMINE-VITAMIN D PO Take 2 tablets by mouth daily.   ibuprofen (ADVIL) 200 MG tablet Take 400 mg by mouth every 6 (six) hours as needed.   Melatonin 10 MG TABS Take 10 mg by mouth at bedtime as needed (sleep).   Multiple Vitamins-Minerals (MULTIVITAMIN PO) Take 1 tablet by mouth daily.   Omega 3-6-9 Fatty Acids (TRIPLE OMEGA-3-6-9) CAPS Take 2 capsules by mouth daily.   oxyCODONE (OXY IR/ROXICODONE) 5 MG immediate release tablet Take 5 mg by mouth 4 (four) times daily as needed.   potassium chloride SA (KLOR-CON M) 20 MEQ tablet Take 1 tablet (20 mEq total) by mouth daily.   Probiotic Product (PROBIOTIC DAILY PO) Take 1 capsule by mouth daily.   sertraline (ZOLOFT) 100 MG tablet Take 1 tablet bu mouth daily   TURMERIC PO Take 1,200 mg by mouth daily.   Evolocumab (REPATHA) 140 MG/ML SOSY Inject 140 mg into the skin every 14 (fourteen) days. (Patient not taking: Reported on 09/12/2022)   No facility-administered encounter medications on file as of 09/12/2022.    Allergies (verified) Lisinopril, Nsaids, Statins, Sulfa antibiotics, and Latex   History: Past Medical History:  Diagnosis Date   Allergy    Anxiety    Arthritis  Atypical ductal hyperplasia of right breast    Cancer (HCC)    basal cell CA- below eye   Depression    Family history of brain cancer    Family history of breast cancer    Family history of cervical cancer    Family history of colon cancer    Family history of lung cancer    Family history of melanoma    Family history of pancreatic cancer    Family history of uterine cancer    GERD (gastroesophageal reflux disease)    History of kidney stones    History of nonmelanoma skin cancer    Hypertension    no meds, has white coat syndrome   IFG (impaired  fasting glucose) 2014   Osteoporosis    Pre-diabetes    Wears glasses    Wears partial dentures    upper partial   Past Surgical History:  Procedure Laterality Date   ABDOMINAL HYSTERECTOMY     BREAST LUMPECTOMY WITH RADIOACTIVE SEED LOCALIZATION Right 12/27/2019   Procedure: RIGHT BREAST LUMPECTOMY WITH RADIOACTIVE SEED LOCALIZATION;  Surgeon: Griselda Miner, MD;  Location: [facility];  Service: General;  Laterality: Right;   CARPAL TUNNEL RELEASE Left 07/11/2013   Procedure: LEFT CARPAL TUNNEL RELEASE;  Surgeon: Tami Ribas, MD;  Location: [facility];  Service: Orthopedics;  Laterality: Left;   carpel tunnel  2005   right wrist   COLONOSCOPY     hysterectomy  2002   KNEE SURGERY Left    LAPAROSCOPIC GASTRIC BANDING  2010   LAPAROSCOPIC GASTRIC RESECTION N/A 12/28/2021   Procedure: LAPAROSCOPIC GASTRIC BAND REMOVAL;  Surgeon: Quentin Ore, MD;  Location: [facility];  Service: General;  Laterality: N/A;   ROTATOR CUFF REPAIR Right    TOTAL MASTECTOMY Bilateral 07/17/2020   Procedure: BILATERAL MASTECTOMY;  Surgeon: Griselda Miner, MD;  Location: [facility];  Service: General;  Laterality: Bilateral;   Family History  Problem Relation Age of Onset   Breast cancer Mother 58   Heart attack Father    Cervical cancer Sister 31   Uterine cancer Maternal Aunt 46   Melanoma Cousin 70       head (maternal first cousin)   Lung cancer Cousin 69       (paternal first cousin)   Colon cancer Cousin 67       (paternal first cousin)   Lung cancer Cousin 76       (paternal first cousin)   Colon cancer Cousin 39       (paternal first cousin)   Brain cancer Cousin 73       (paternal first cousin)   Lung cancer Cousin 80       (paternal first cousin)   Pancreatic cancer Cousin 29       (paternal first cousin)   Esophageal cancer Neg Hx    Rectal cancer Neg Hx    Stomach cancer Neg Hx    Social History   Socioeconomic History    Marital status: Divorced    Spouse name: Not on file   Number of children: 1   Years of education: Not on file   Highest education level: Some college, no degree  Occupational History   Occupation: retired  Tobacco Use   Smoking status: Former    Types: Cigarettes    Quit date: 07/09/2000    Years since quitting: 22.1   Smokeless tobacco: Never  Vaping Use   Vaping  Use: Never used  Substance and Sexual Activity   Alcohol use: No   Drug use: No   Sexual activity: Not on file  Other Topics Concern   Not on file  Social History Narrative   Lives with significant other   Double mastectomy in 07/2020 - Dr Carolynne Edouard   Routine visits with Dr Pamelia Hoit, Oncologist   Social Determinants of Health   Financial Resource Strain: Low Risk  (09/12/2022)   Overall Financial Resource Strain (CARDIA)    Difficulty of Paying Living Expenses: Not hard at all  Recent Concern: Financial Resource Strain - Medium Risk (08/12/2022)   Overall Financial Resource Strain (CARDIA)    Difficulty of Paying Living Expenses: Somewhat hard  Food Insecurity: No Food Insecurity (09/12/2022)   Hunger Vital Sign    Worried About Running Out of Food in the Last Year: Never true    Ran Out of Food in the Last Year: Never true  Transportation Needs: No Transportation Needs (09/12/2022)   PRAPARE - Administrator, Civil Service (Medical): No    Lack of Transportation (Non-Medical): No  Physical Activity: Insufficiently Active (09/12/2022)   Exercise Vital Sign    Days of Exercise per Week: 7 days    Minutes of Exercise per Session: 20 min  Stress: No Stress Concern Present (09/12/2022)   [employer] of Occupational Health - Occupational Stress Questionnaire    Feeling of Stress : Not at all  Recent Concern: Stress - Stress Concern Present (08/12/2022)   [employer] of Occupational Health - Occupational Stress Questionnaire    Feeling of Stress : Rather much  Social Connections: Moderately Isolated (09/12/2022)    Social Connection and Isolation Panel [NHANES]    Frequency of Communication with Friends and Family: More than three times a week    Frequency of Social Gatherings with Friends and Family: More than three times a week    Attends Religious Services: Never    Database administrator or Organizations: Yes    Attends Engineer, structural: More than 4 times per year    Marital Status: Divorced    Tobacco Counseling Counseling given: Not Answered   Clinical Intake:  Pre-visit preparation completed: Yes  Pain : No/denies pain     Nutritional Risks: None Diabetes: No  How often do you need to have someone help you when you read instructions, pamphlets, or other written materials from your doctor or pharmacy?: 1 - Never  Diabetic?no   Interpreter Needed?: No  Information entered by :: Renie Ora, LPN   Activities of Daily Living    09/12/2022    2:58 PM 09/08/2022   10:45 PM  In your present state of health, do you have any difficulty performing the following activities:  Hearing? 0 0  Vision? 0 0  Difficulty concentrating or making decisions? 0 0  Walking or climbing stairs? 0 0  Dressing or bathing? 0 0  Doing errands, shopping? 0 0  Preparing Food and eating ? N N  Using the Toilet? N N  In the past six months, have you accidently leaked urine? N N  Do you have problems with loss of bowel control? N N  Managing your Medications? N N  Managing your Finances? N N  Housekeeping or managing your Housekeeping? N N    Patient Care Team: Mechele Claude, MD as PCP - General (Family Medicine) Mallipeddi, Orion Modest, MD as PCP - Cardiology (Cardiology) Serena Croissant, MD as Consulting Physician (Hematology and  Oncology) Marcelino Duster, MD as Referring Physician (Dermatology)  Indicate any recent Medical Services you may have received from other than [facility] providers in the past year (date may be approximate).     Assessment:   This is a routine wellness  examination for [facility].  Hearing/Vision screen Vision Screening - Comments:: Wears rx glasses - up to date with routine eye exams with  Dr.Lee   Dietary issues and exercise activities discussed: Current Exercise Habits: Home exercise routine, Type of exercise: walking, Time (Minutes): 20, Frequency (Times/Week): 7, Weekly Exercise (Minutes/Week): 140, Intensity: Mild, Exercise limited by: None identified   Goals Addressed             This Visit's Progress    DIET - INCREASE WATER INTAKE         Depression Screen    09/12/2022    2:57 PM 08/16/2022   11:29 AM 01/28/2022   10:28 AM 09/10/2021    2:50 PM 01/26/2021   11:12 AM 11/06/2020    9:34 AM 09/09/2020    3:01 PM  PHQ 2/9 Scores  PHQ - 2 Score 0 2 0 0 0 0 1  PHQ- 9 Score 0 7 3   0     Fall Risk    09/12/2022    2:55 PM 09/08/2022   10:45 PM 08/16/2022   11:28 AM 01/28/2022    9:58 AM 09/10/2021    2:48 PM  Fall Risk   Falls in the past year? 0 1 1 0 0  Number falls in past yr: 0 0 0  0  Injury with Fall? 0 1 1  0  Risk for fall due to : No Fall Risks    No Fall Risks  Follow up Falls prevention discussed  Falls prevention discussed  Falls prevention discussed    FALL RISK PREVENTION PERTAINING TO THE HOME:  Any stairs in or around the home? No  If so, are there any without handrails? No  Home free of loose throw rugs in walkways, pet beds, electrical cords, etc? Yes  Adequate lighting in your home to reduce risk of falls? Yes   ASSISTIVE DEVICES UTILIZED TO PREVENT FALLS:  Life alert? No  Use of a cane, walker or w/c? No  Grab bars in the bathroom? No  Shower chair or bench in shower? Yes  Elevated toilet seat or a handicapped toilet? No          09/12/2022    2:58 PM 09/10/2021    2:56 PM 09/09/2020    3:13 PM  6CIT Screen  What Year? 0 points 0 points 0 points  What month? 0 points 0 points 0 points  What time? 0 points 0 points 0 points  Count back from 20 0 points 0 points 0 points  Months in reverse 0  points 0 points 0 points  Repeat phrase 0 points 2 points 0 points  Total Score 0 points 2 points 0 points    Immunizations Immunization History  Administered Date(s) Administered   Fluad Quad(high Dose 65+) 01/27/2020, 01/26/2021   Influenza Inj Mdck Quad Pf 01/25/2018   Influenza Split 01/29/2013   Influenza, High Dose Seasonal PF 01/02/2019   Influenza-Unspecified 01/10/2015   PFIZER(Purple Top)SARS-COV-2 Vaccination 04/26/2019, 05/17/2019, 01/07/2020   PNEUMOCOCCAL CONJUGATE-20 01/26/2021   Pneumococcal Polysaccharide-23 12/03/2014   Tdap 06/05/2014   Zoster Recombinat (Shingrix) 02/07/2017, 10/30/2017   Zoster, Live 11/20/2012, 01/09/2013    TDAP status: Up to date  Flu Vaccine status: Up to date  Pneumococcal vaccine status: Up to date  Covid-19 vaccine status: Completed vaccines  Qualifies for Shingles Vaccine? Yes   Zostavax completed Yes   Shingrix Completed?: Yes  Screening Tests Health Maintenance  Topic Date Due   COVID-19 Vaccine (4 - 2023-24 season) 12/10/2021   Colonoscopy  05/08/2022   INFLUENZA VACCINE  11/10/2022   Diabetic kidney evaluation - Urine ACR  01/29/2023   Diabetic kidney evaluation - eGFR measurement  08/16/2023   Medicare Annual Wellness (AWV)  09/12/2023   DEXA SCAN  05/09/2024   DTaP/Tdap/Td (2 - Td or Tdap) 06/05/2024   Pneumonia Vaccine 62+ Years old  Completed   Hepatitis C Screening  Completed   Zoster Vaccines- Shingrix  Completed   HPV VACCINES  Aged Out   FOOT EXAM  Discontinued   HEMOGLOBIN A1C  Discontinued   OPHTHALMOLOGY EXAM  Discontinued    Health Maintenance  Health Maintenance Due  Topic Date Due   COVID-19 Vaccine (4 - 2023-24 season) 12/10/2021   Colonoscopy  05/08/2022    Colorectal cancer screening: Referral to GI placed 09/12/2022. Pt aware the office will call re: appt.  Mammogram status: No longer required due to mastectomy .  Bone Density status: Completed 05/10/2019. Results reflect: Bone density  results: OSTEOPENIA. Repeat every 5 years.  Lung Cancer Screening: (Low Dose CT Chest recommended if Age 49-80 years, 30 pack-year currently smoking OR have quit w/in 15years.) does not qualify.   Lung Cancer Screening Referral: n/a  Additional Screening:  Hepatitis C Screening: does not qualify; Completed 01/28/2020  Vision Screening: Recommended annual ophthalmology exams for early detection of glaucoma and other disorders of the eye. Is the patient up to date with their annual eye exam?  Yes  Who is the provider or what is the name of the office in which the patient attends annual eye exams? Dr.Lee  If pt is not established with a provider, would they like to be referred to a provider to establish care? No .   Dental Screening: Recommended annual dental exams for proper oral hygiene  Community Resource Referral / Chronic Care Management: CRR required this visit?  No   CCM required this visit?  No      Plan:     I have personally reviewed and noted the following in the patient's chart:   Medical and social history Use of alcohol, tobacco or illicit drugs  Current medications and supplements including opioid prescriptions. Patient is not currently taking opioid prescriptions. Functional ability and status Nutritional status Physical activity Advanced directives List of other physicians Hospitalizations, surgeries, and ER visits in previous 12 months Vitals Screenings to include cognitive, depression, and falls Referrals and appointments  In addition, I have reviewed and discussed with patient certain preventive protocols, quality metrics, and best practice recommendations. A written personalized care plan for preventive services as well as general preventive health recommendations were provided to patient.     Lorrene Reid, LPN   04/16/1094   Nurse Notes: none

## 2022-09-12 NOTE — Patient Instructions (Signed)
Bridget Conway , Thank you for taking time to come for your Medicare Wellness Visit. I appreciate your ongoing commitment to your health goals. Please review the following plan we discussed and let me know if I can assist you in the future.   These are the goals we discussed:  Goals      DIET - INCREASE WATER INTAKE     Patient Stated     She would like to lose weight so that she can get knee operation to be able to get around better and not hurt        This is a list of the screening recommended for you and due dates:  Health Maintenance  Topic Date Due   COVID-19 Vaccine (4 - 2023-24 season) 12/10/2021   Colon Cancer Screening  05/08/2022   Flu Shot  11/10/2022   Yearly kidney health urinalysis for diabetes  01/29/2023   Yearly kidney function blood test for diabetes  08/16/2023   Medicare Annual Wellness Visit  09/12/2023   DEXA scan (bone density measurement)  05/09/2024   DTaP/Tdap/Td vaccine (2 - Td or Tdap) 06/05/2024   Pneumonia Vaccine  Completed   Hepatitis C Screening  Completed   Zoster (Shingles) Vaccine  Completed   HPV Vaccine  Aged Out   Complete foot exam   Discontinued   Hemoglobin A1C  Discontinued   Eye exam for diabetics  Discontinued    Advanced directives: Advance directive discussed with you today. I have provided a copy for you to complete at home and have notarized. Once this is complete please bring a copy in to our office so we can scan it into your chart.   Conditions/risks identified: Aim for 30 minutes of exercise or brisk walking, 6-8 glasses of water, and 5 servings of fruits and vegetables each day.   Next appointment: Follow up in one year for your annual wellness visit    Preventive Care 65 Years and Older, Female Preventive care refers to lifestyle choices and visits with your health care provider that can promote health and wellness. What does preventive care include? A yearly physical exam. This is also called an annual well check. Dental  exams once or twice a year. Routine eye exams. Ask your health care provider how often you should have your eyes checked. Personal lifestyle choices, including: Daily care of your teeth and gums. Regular physical activity. Eating a healthy diet. Avoiding tobacco and drug use. Limiting alcohol use. Practicing safe sex. Taking low-dose aspirin every day. Taking vitamin and mineral supplements as recommended by your health care provider. What happens during an annual well check? The services and screenings done by your health care provider during your annual well check will depend on your age, overall health, lifestyle risk factors, and family history of disease. Counseling  Your health care provider may ask you questions about your: Alcohol use. Tobacco use. Drug use. Emotional well-being. Home and relationship well-being. Sexual activity. Eating habits. History of falls. Memory and ability to understand (cognition). Work and work Astronomer. Reproductive health. Screening  You may have the following tests or measurements: Height, weight, and BMI. Blood pressure. Lipid and cholesterol levels. These may be checked every 5 years, or more frequently if you are over 48 years old. Skin check. Lung cancer screening. You may have this screening every year starting at age 93 if you have a 30-pack-year history of smoking and currently smoke or have quit within the past 15 years. Fecal occult blood test (FOBT)  of the stool. You may have this test every year starting at age 94. Flexible sigmoidoscopy or colonoscopy. You may have a sigmoidoscopy every 5 years or a colonoscopy every 10 years starting at age 16. Hepatitis C blood test. Hepatitis B blood test. Sexually transmitted disease (STD) testing. Diabetes screening. This is done by checking your blood sugar (glucose) after you have not eaten for a while (fasting). You may have this done every 1-3 years. Bone density scan. This is done  to screen for osteoporosis. You may have this done starting at age 27. Mammogram. This may be done every 1-2 years. Talk to your health care provider about how often you should have regular mammograms. Talk with your health care provider about your test results, treatment options, and if necessary, the need for more tests. Vaccines  Your health care provider may recommend certain vaccines, such as: Influenza vaccine. This is recommended every year. Tetanus, diphtheria, and acellular pertussis (Tdap, Td) vaccine. You may need a Td booster every 10 years. Zoster vaccine. You may need this after age 63. Pneumococcal 13-valent conjugate (PCV13) vaccine. One dose is recommended after age 51. Pneumococcal polysaccharide (PPSV23) vaccine. One dose is recommended after age 51. Talk to your health care provider about which screenings and vaccines you need and how often you need them. This information is not intended to replace advice given to you by your health care provider. Make sure you discuss any questions you have with your health care provider. Document Released: 04/24/2015 Document Revised: 12/16/2015 Document Reviewed: 01/27/2015 Elsevier Interactive Patient Education  2017 [employer].  Fall Prevention in the Home Falls can cause injuries. They can happen to people of all ages. There are many things you can do to make your home safe and to help prevent falls. What can I do on the outside of my home? Regularly fix the edges of walkways and driveways and fix any cracks. Remove anything that might make you trip as you walk through a door, such as a raised step or threshold. Trim any bushes or trees on the path to your home. Use bright outdoor lighting. Clear any walking paths of anything that might make someone trip, such as rocks or tools. Regularly check to see if handrails are loose or broken. Make sure that both sides of any steps have handrails. Any raised decks and porches should have  guardrails on the edges. Have any leaves, snow, or ice cleared regularly. Use sand or salt on walking paths during winter. Clean up any spills in your garage right away. This includes oil or grease spills. What can I do in the bathroom? Use night lights. Install grab bars by the toilet and in the tub and shower. Do not use towel bars as grab bars. Use non-skid mats or decals in the tub or shower. If you need to sit down in the shower, use a plastic, non-slip stool. Keep the floor dry. Clean up any water that spills on the floor as soon as it happens. Remove soap buildup in the tub or shower regularly. Attach bath mats securely with double-sided non-slip rug tape. Do not have throw rugs and other things on the floor that can make you trip. What can I do in the bedroom? Use night lights. Make sure that you have a light by your bed that is easy to reach. Do not use any sheets or blankets that are too big for your bed. They should not hang down onto the floor. Have a firm  chair that has side arms. You can use this for support while you get dressed. Do not have throw rugs and other things on the floor that can make you trip. What can I do in the kitchen? Clean up any spills right away. Avoid walking on wet floors. Keep items that you use a lot in easy-to-reach places. If you need to reach something above you, use a strong step stool that has a grab bar. Keep electrical cords out of the way. Do not use floor polish or wax that makes floors slippery. If you must use wax, use non-skid floor wax. Do not have throw rugs and other things on the floor that can make you trip. What can I do with my stairs? Do not leave any items on the stairs. Make sure that there are handrails on both sides of the stairs and use them. Fix handrails that are broken or loose. Make sure that handrails are as long as the stairways. Check any carpeting to make sure that it is firmly attached to the stairs. Fix any carpet  that is loose or worn. Avoid having throw rugs at the top or bottom of the stairs. If you do have throw rugs, attach them to the floor with carpet tape. Make sure that you have a light switch at the top of the stairs and the bottom of the stairs. If you do not have them, ask someone to add them for you. What else can I do to help prevent falls? Wear shoes that: Do not have high heels. Have rubber bottoms. Are comfortable and fit you well. Are closed at the toe. Do not wear sandals. If you use a stepladder: Make sure that it is fully opened. Do not climb a closed stepladder. Make sure that both sides of the stepladder are locked into place. Ask someone to hold it for you, if possible. Clearly mark and make sure that you can see: Any grab bars or handrails. First and last steps. Where the edge of each step is. Use tools that help you move around (mobility aids) if they are needed. These include: Canes. Walkers. Scooters. Crutches. Turn on the lights when you go into a dark area. Replace any light bulbs as soon as they burn out. Set up your furniture so you have a clear path. Avoid moving your furniture around. If any of your floors are uneven, fix them. If there are any pets around you, be aware of where they are. Review your medicines with your doctor. Some medicines can make you feel dizzy. This can increase your chance of falling. Ask your doctor what other things that you can do to help prevent falls. This information is not intended to replace advice given to you by your health care provider. Make sure you discuss any questions you have with your health care provider. Document Released: 01/22/2009 Document Revised: 09/03/2015 Document Reviewed: 05/02/2014 Elsevier Interactive Patient Education  2017 [employer].

## 2022-09-13 ENCOUNTER — Ambulatory Visit: Payer: Medicare Other | Admitting: Family Medicine

## 2022-09-13 ENCOUNTER — Encounter: Payer: Self-pay | Admitting: Gastroenterology

## 2022-09-22 ENCOUNTER — Telehealth: Payer: Self-pay | Admitting: Family Medicine

## 2022-09-22 ENCOUNTER — Encounter: Payer: Self-pay | Admitting: Family Medicine

## 2022-09-22 ENCOUNTER — Ambulatory Visit (INDEPENDENT_AMBULATORY_CARE_PROVIDER_SITE_OTHER): Payer: Medicare Other | Admitting: Family Medicine

## 2022-09-22 VITALS — BP 139/86 | HR 83 | Temp 97.1°F | Ht 65.0 in | Wt 258.4 lb

## 2022-09-22 DIAGNOSIS — I10 Essential (primary) hypertension: Secondary | ICD-10-CM | POA: Diagnosis not present

## 2022-09-22 DIAGNOSIS — Z789 Other specified health status: Secondary | ICD-10-CM | POA: Diagnosis not present

## 2022-09-22 DIAGNOSIS — E785 Hyperlipidemia, unspecified: Secondary | ICD-10-CM | POA: Diagnosis not present

## 2022-09-22 DIAGNOSIS — M2550 Pain in unspecified joint: Secondary | ICD-10-CM

## 2022-09-22 DIAGNOSIS — F339 Major depressive disorder, recurrent, unspecified: Secondary | ICD-10-CM

## 2022-09-22 MED ORDER — DULOXETINE HCL 30 MG PO CPEP: 30.0000 mg | ORAL_CAPSULE | Freq: Every day | ORAL | 0 refills | Status: DC

## 2022-09-22 NOTE — Telephone Encounter (Signed)
Patient said she was supposed to have Duloxetine called in for arthritidis pain but it has not been sent in. Discussed at appt 6/13

## 2022-09-22 NOTE — Telephone Encounter (Signed)
Please let the patient know that I sent their prescription to their pharmacy. Thanks, WS 

## 2022-09-22 NOTE — Progress Notes (Signed)
Subjective:  Patient ID: Bridget Conway, female    DOB: 01-27-1956  Age: 67 y.o. MRN: [id number]  CC: Follow-up and ARTHRALGIAS   HPI Bridget Conway presents for recheck. Arthralgias went away when she stopped taking repatha. Celebrex caused fluid retention. Can't take any NSAIDs. Still having arthritis pains in multiple joints. Pain is moderate. Can be severe at times. Labs showed no tick - borne disease. Will not pursue further since sx resolved. However, she cannot tolerate statins, and now repatha. Wants to consider other options.  She still has arthritis pain, much less than while taking the repatha. Needs med since she cannot take NSAIDs and tylenol not effective.  Pt. Stable with depression tx, but willing to change if cymbalta will work for the arhralgias as well.     09/22/2022   11:18 AM 09/12/2022    2:57 PM 08/16/2022   11:29 AM  Depression screen PHQ 2/9  Decreased Interest 0 0 1  Down, Depressed, Hopeless 0 0 1  PHQ - 2 Score 0 0 2  Altered sleeping  0 2  Tired, decreased energy  0 2  Change in appetite  0 0  Feeling bad or failure about yourself   0 0  Trouble concentrating  0 1  Moving slowly or fidgety/restless  0 0  Suicidal thoughts  0 0  PHQ-9 Score  0 7  Difficult doing work/chores  Not difficult at all Somewhat difficult    History Bridget Conway has a past medical history of Allergy, Anxiety, Arthritis, Atypical ductal hyperplasia of right breast, Cancer (HCC), Depression, Family history of brain cancer, Family history of breast cancer, Family history of cervical cancer, Family history of colon cancer, Family history of lung cancer, Family history of melanoma, Family history of pancreatic cancer, Family history of uterine cancer, GERD (gastroesophageal reflux disease), History of kidney stones, History of nonmelanoma skin cancer, Hypertension, IFG (impaired fasting glucose) (2014), Osteoporosis, Pre-diabetes, Wears glasses, and Wears partial dentures.   She has a  past surgical history that includes carpel tunnel (2005); hysterectomy (2002); Laparoscopic gastric banding (2010); Colonoscopy; Carpal tunnel release (Left, 07/11/2013); Knee surgery (Left); Abdominal hysterectomy; Breast lumpectomy with radioactive seed localization (Right, 12/27/2019); Total mastectomy (Bilateral, 07/17/2020); Laparoscopic gastric resection (N/A, 12/28/2021); and Rotator cuff repair (Right).   Her family history includes Brain cancer (age of onset: 52) in her cousin; Breast cancer (age of onset: 51) in her mother; Cervical cancer (age of onset: 28) in her sister; Colon cancer (age of onset: 58) in her cousin; Colon cancer (age of onset: 32) in her cousin; Heart attack in her father; Lung cancer (age of onset: 48) in her cousin; Lung cancer (age of onset: 34) in her cousin; Lung cancer (age of onset: 14) in her cousin; Melanoma (age of onset: 21) in her cousin; Pancreatic cancer (age of onset: 66) in her cousin; Uterine cancer (age of onset: 4) in her maternal aunt.She reports that she quit smoking about 22 years ago. Her smoking use included cigarettes. She has never used smokeless tobacco. She reports that she does not drink alcohol and does not use drugs.    ROS Review of Systems  Constitutional: Negative.   HENT:  Negative for congestion.   Eyes:  Negative for visual disturbance.  Respiratory:  Negative for shortness of breath.   Cardiovascular:  Negative for chest pain.  Gastrointestinal:  Negative for abdominal pain and nausea.  Genitourinary:  Negative for difficulty urinating.  Musculoskeletal:  Positive for arthralgias and myalgias.  Neurological:  Positive for headaches.  Psychiatric/Behavioral:  Negative for sleep disturbance.     Objective:  BP 139/86   Pulse 83   Temp (!) 97.1 F (36.2 C)   Ht 5\' 5"  (1.651 m)   Wt 258 lb 6.4 oz (117.2 kg)   SpO2 96%   BMI 43.00 kg/m   BP Readings from Last 3 Encounters:  09/22/22 139/86  08/16/22 (!) 169/87  06/14/22  138/84    Wt Readings from Last 3 Encounters:  09/22/22 258 lb 6.4 oz (117.2 kg)  09/12/22 247 lb (112 kg)  08/16/22 262 lb (118.8 kg)     Physical Exam Constitutional:      General: She is not in acute distress.    Appearance: She is well-developed. She is obese.  Cardiovascular:     Rate and Rhythm: Normal rate and regular rhythm.  Pulmonary:     Breath sounds: Normal breath sounds.  Musculoskeletal:        General: Normal range of motion.  Skin:    General: Skin is warm and dry.  Neurological:     Mental Status: She is alert and oriented to person, place, and time.       Assessment & Plan:   Bridget Conway was seen today for follow-up and arthralgias.  Diagnoses and all orders for this visit:  Primary hypertension -     Lipid panel; Future -     CMP14+EGFR; Future -     CBC with Differential/Platelet; Future  Hyperlipidemia, unspecified hyperlipidemia type -     Lipid panel; Future -     CMP14+EGFR; Future -     CBC with Differential/Platelet; Future  Statin intolerance  Depression, recurrent (HCC)  Arthralgia of multiple sites  Other orders -     DULoxetine (CYMBALTA) 30 MG capsule; Take 1 capsule (30 mg total) by mouth daily. For one week then two daily. Take with a full stomach at suppertime       I have discontinued Bridget Conway "Bridget Conway Melatonin, sertraline, Repatha, ibuprofen, and oxyCODONE. I am also having her start on DULoxetine. Additionally, I am having her maintain her Multiple Vitamins-Minerals (MULTIVITAMIN PO), Probiotic Product (PROBIOTIC DAILY PO), GLUCOSAMINE-VITAMIN D PO, Triple Omega-3-6-9, Calcium Carb-Cholecalciferol (CALCIUM 600 + D PO), TURMERIC PO, acetaminophen, diphenhydrAMINE-zinc acetate, fluticasone, furosemide, and potassium chloride SA.  Allergies as of 09/22/2022       Reactions   Lisinopril Cough   Nsaids    Fluid retention    Repatha [evolocumab]    arthralgia   Statins    Muscle pain   Sulfa Antibiotics Hives    Latex Rash        Medication List        Accurate as of September 22, 2022  4:49 PM. If you have any questions, ask your nurse or doctor.          STOP taking these medications    ibuprofen 200 MG tablet Commonly known as: ADVIL Stopped by: Bridget Conway Claude, MD   Melatonin 10 MG Tabs Stopped by: Bridget Conway Claude, MD   oxyCODONE 5 MG immediate release tablet Commonly known as: Oxy IR/ROXICODONE Stopped by: Bridget Conway Claude, MD   Repatha 140 MG/ML Sosy Generic drug: Evolocumab Stopped by: Bridget Conway Claude, MD   sertraline 100 MG tablet Commonly known as: ZOLOFT Stopped by: Bridget Conway Claude, MD       TAKE these medications    acetaminophen 650 MG CR tablet Commonly known as: TYLENOL Take 1,300 mg by mouth every 8 (  eight) hours as needed for pain.   CALCIUM 600 + D PO Take 1 tablet by mouth daily.   diphenhydrAMINE-zinc acetate cream Commonly known as: BENADRYL Apply 1 Application topically 3 (three) times daily as needed for itching.   DULoxetine 30 MG capsule Commonly known as: Cymbalta Take 1 capsule (30 mg total) by mouth daily. For one week then two daily. Take with a full stomach at suppertime Started by: Bridget Conway Claude, MD   fluticasone 50 MCG/ACT nasal spray Commonly known as: FLONASE Place 1 spray into both nostrils daily as needed for allergies.   furosemide 20 MG tablet Commonly known as: LASIX Take 20 mg by mouth daily.   GLUCOSAMINE-VITAMIN D PO Take 2 tablets by mouth daily.   MULTIVITAMIN PO Take 1 tablet by mouth daily.   potassium chloride SA 20 MEQ tablet Commonly known as: KLOR-CON M Take 1 tablet (20 mEq total) by mouth daily.   PROBIOTIC DAILY PO Take 1 capsule by mouth daily.   Triple Omega-3-6-9 Caps Take 2 capsules by mouth daily.   TURMERIC PO Take 1,200 mg by mouth daily.         Follow-up: Return in about 1 month (around 10/22/2022).  Bridget Conway Claude, M.D.

## 2022-09-22 NOTE — Telephone Encounter (Signed)
Called patient, no answer 

## 2022-10-05 DIAGNOSIS — M546 Pain in thoracic spine: Secondary | ICD-10-CM | POA: Diagnosis not present

## 2022-10-05 DIAGNOSIS — M9903 Segmental and somatic dysfunction of lumbar region: Secondary | ICD-10-CM | POA: Diagnosis not present

## 2022-10-05 DIAGNOSIS — M47816 Spondylosis without myelopathy or radiculopathy, lumbar region: Secondary | ICD-10-CM | POA: Diagnosis not present

## 2022-10-05 DIAGNOSIS — M9902 Segmental and somatic dysfunction of thoracic region: Secondary | ICD-10-CM | POA: Diagnosis not present

## 2022-10-05 DIAGNOSIS — M542 Cervicalgia: Secondary | ICD-10-CM | POA: Diagnosis not present

## 2022-10-05 DIAGNOSIS — M9901 Segmental and somatic dysfunction of cervical region: Secondary | ICD-10-CM | POA: Diagnosis not present

## 2022-10-10 DIAGNOSIS — B079 Viral wart, unspecified: Secondary | ICD-10-CM | POA: Diagnosis not present

## 2022-10-10 DIAGNOSIS — L570 Actinic keratosis: Secondary | ICD-10-CM | POA: Diagnosis not present

## 2022-10-10 DIAGNOSIS — D485 Neoplasm of uncertain behavior of skin: Secondary | ICD-10-CM | POA: Diagnosis not present

## 2022-10-17 ENCOUNTER — Ambulatory Visit (AMBULATORY_SURGERY_CENTER): Payer: Medicare Other

## 2022-10-17 ENCOUNTER — Other Ambulatory Visit: Payer: Self-pay | Admitting: Family Medicine

## 2022-10-17 ENCOUNTER — Encounter: Payer: Self-pay | Admitting: Gastroenterology

## 2022-10-17 VITALS — Ht 65.0 in | Wt 250.0 lb

## 2022-10-17 DIAGNOSIS — Z1211 Encounter for screening for malignant neoplasm of colon: Secondary | ICD-10-CM

## 2022-10-17 DIAGNOSIS — F331 Major depressive disorder, recurrent, moderate: Secondary | ICD-10-CM

## 2022-10-17 NOTE — Progress Notes (Signed)
No egg or soy allergy known to patient  No issues known to pt with past sedation with any surgeries or procedures Patient denies ever being told they had issues or difficulty with intubation  No FH of Malignant Hyperthermia Pt is not on diet pills Pt is not on  home 02  Pt is not on blood thinners  Pt denies issues with constipation  No A fib or A flutter Have any cardiac testing pending--no  LOA: independent  Prep: Miralax   Patient's chart reviewed by Cathlyn Parsons CNRA prior to previsit and patient appropriate for the [facility].  Previsit completed and red dot placed by patient's name on their procedure day (on provider's schedule).     PV competed with patient. Prep instructions sent via mychart and home address.

## 2022-10-18 ENCOUNTER — Encounter: Payer: Self-pay | Admitting: Family Medicine

## 2022-10-19 ENCOUNTER — Other Ambulatory Visit: Payer: Self-pay | Admitting: Family Medicine

## 2022-10-19 DIAGNOSIS — F331 Major depressive disorder, recurrent, moderate: Secondary | ICD-10-CM

## 2022-10-19 MED ORDER — SERTRALINE HCL 100 MG PO TABS
ORAL_TABLET | ORAL | 0 refills | Status: DC
Start: 2022-10-19 — End: 2022-12-05

## 2022-10-20 DIAGNOSIS — M47816 Spondylosis without myelopathy or radiculopathy, lumbar region: Secondary | ICD-10-CM | POA: Diagnosis not present

## 2022-10-20 DIAGNOSIS — M9901 Segmental and somatic dysfunction of cervical region: Secondary | ICD-10-CM | POA: Diagnosis not present

## 2022-10-20 DIAGNOSIS — M9902 Segmental and somatic dysfunction of thoracic region: Secondary | ICD-10-CM | POA: Diagnosis not present

## 2022-10-20 DIAGNOSIS — M546 Pain in thoracic spine: Secondary | ICD-10-CM | POA: Diagnosis not present

## 2022-10-20 DIAGNOSIS — M542 Cervicalgia: Secondary | ICD-10-CM | POA: Diagnosis not present

## 2022-10-20 DIAGNOSIS — M9903 Segmental and somatic dysfunction of lumbar region: Secondary | ICD-10-CM | POA: Diagnosis not present

## 2022-10-21 ENCOUNTER — Telehealth: Payer: Self-pay | Admitting: Family Medicine

## 2022-10-21 ENCOUNTER — Other Ambulatory Visit: Payer: Medicare Other

## 2022-10-21 DIAGNOSIS — I10 Essential (primary) hypertension: Secondary | ICD-10-CM

## 2022-10-21 DIAGNOSIS — E785 Hyperlipidemia, unspecified: Secondary | ICD-10-CM

## 2022-10-21 LAB — LIPID PANEL

## 2022-10-21 LAB — CBC WITH DIFFERENTIAL/PLATELET
Basophils Absolute: 0.1 10*3/uL (ref 0.0–0.2)
Basos: 1 %
Eos: 3 %
Hemoglobin: 13.5 g/dL (ref 11.1–15.9)
Immature Grans (Abs): 0 10*3/uL (ref 0.0–0.1)
Immature Granulocytes: 0 %
Lymphocytes Absolute: 2.1 10*3/uL (ref 0.7–3.1)
Lymphs: 41 %
MCV: 96 fL (ref 79–97)
Monocytes Absolute: 0.5 10*3/uL (ref 0.1–0.9)
Monocytes: 9 %
Neutrophils Absolute: 2.4 10*3/uL (ref 1.4–7.0)
WBC: 5.2 10*3/uL (ref 3.4–10.8)

## 2022-10-21 LAB — CMP14+EGFR
Chloride: 103 mmol/L (ref 96–106)
Sodium: 141 mmol/L (ref 134–144)

## 2022-10-21 NOTE — Telephone Encounter (Signed)
Pt received email through [facility] asking if she wanted to do a cologuard. Pt is already scheduled for a colonscopy so she does not need to have cologuard done.

## 2022-10-22 LAB — CMP14+EGFR
AST: 18 IU/L (ref 0–40)
Alkaline Phosphatase: 71 IU/L (ref 44–121)
Calcium: 8.7 mg/dL (ref 8.7–10.3)
Creatinine, Ser: 0.61 mg/dL (ref 0.57–1.00)
Potassium: 4.2 mmol/L (ref 3.5–5.2)
eGFR: 97 mL/min/{1.73_m2} (ref >59)

## 2022-10-22 LAB — CBC WITH DIFFERENTIAL/PLATELET
EOS (ABSOLUTE): 0.2 10*3/uL (ref 0.0–0.4)
Hematocrit: 40.9 % (ref 34.0–46.6)
MCH: 31.8 pg (ref 26.6–33.0)
MCHC: 33 g/dL (ref 31.5–35.7)
Neutrophils: 46 %
Platelets: 257 10*3/uL (ref 150–450)
RBC: 4.25 x10E6/uL (ref 3.77–5.28)
RDW: 12.3 % (ref 11.7–15.4)

## 2022-10-22 LAB — LIPID PANEL
Cholesterol, Total: 205 mg/dL — ABNORMAL HIGH (ref 100–199)
HDL: 38 mg/dL — ABNORMAL LOW (ref >39)

## 2022-10-24 ENCOUNTER — Ambulatory Visit (INDEPENDENT_AMBULATORY_CARE_PROVIDER_SITE_OTHER): Payer: Medicare Other | Admitting: Family Medicine

## 2022-10-24 ENCOUNTER — Encounter: Payer: Self-pay | Admitting: Family Medicine

## 2022-10-24 VITALS — BP 109/77 | HR 77 | Temp 96.8°F | Ht 65.0 in | Wt 260.4 lb

## 2022-10-24 DIAGNOSIS — M2550 Pain in unspecified joint: Secondary | ICD-10-CM

## 2022-10-24 DIAGNOSIS — Z789 Other specified health status: Secondary | ICD-10-CM

## 2022-10-24 DIAGNOSIS — E785 Hyperlipidemia, unspecified: Secondary | ICD-10-CM

## 2022-10-24 DIAGNOSIS — F339 Major depressive disorder, recurrent, unspecified: Secondary | ICD-10-CM | POA: Diagnosis not present

## 2022-10-24 DIAGNOSIS — Z6841 Body Mass Index (BMI) 40.0 and over, adult: Secondary | ICD-10-CM

## 2022-10-24 MED ORDER — EZETIMIBE 10 MG PO TABS: 10.0000 mg | ORAL_TABLET | Freq: Every day | ORAL | 3 refills | Status: DC

## 2022-10-24 MED ORDER — PREGABALIN 50 MG PO CAPS: ORAL_CAPSULE | ORAL | 0 refills | Status: DC

## 2022-10-24 NOTE — Progress Notes (Signed)
Subjective:  Patient ID: Bridget Conway, female    DOB: 26-Oct-1955  Age: 67 y.o. MRN: [id number]  CC: Follow-up and Hypertension   HPI Bridget Conway presents for continued arthritis pain. Taking aleve. Was not comfortable with the switch from sertraline to duloxetine. Wants something for pain. Taking aleve again. Pain affecting multiple  joints, including spine.  She also has had myalgias with statin drugs. Could not tolerate repatha due to diarrhea. Cholesterol elevated, raising risk for CVD & CAD     10/24/2022    9:03 AM 09/22/2022   11:18 AM 09/12/2022    2:57 PM  Depression screen PHQ 2/9  Decreased Interest 0 0 0  Down, Depressed, Hopeless 0 0 0  PHQ - 2 Score 0 0 0  Altered sleeping   0  Tired, decreased energy   0  Change in appetite   0  Feeling bad or failure about yourself    0  Trouble concentrating   0  Moving slowly or fidgety/restless   0  Suicidal thoughts   0  PHQ-9 Score   0  Difficult doing work/chores   Not difficult at all    History Bridget Conway has a past medical history of Allergy, Anxiety, Arthritis, Atypical ductal hyperplasia of right breast, Cancer (HCC), Depression, Family history of brain cancer, Family history of breast cancer, Family history of cervical cancer, Family history of colon cancer, Family history of lung cancer, Family history of melanoma, Family history of pancreatic cancer, Family history of uterine cancer, GERD (gastroesophageal reflux disease), Heart murmur, History of kidney stones, History of nonmelanoma skin cancer, Hypertension, IFG (impaired fasting glucose) (2014), Osteoporosis, Pre-diabetes, Wears glasses, and Wears partial dentures.   She has a past surgical history that includes carpel tunnel (2005); hysterectomy (2002); Laparoscopic gastric banding (2010); Colonoscopy; Carpal tunnel release (Left, 07/11/2013); Knee surgery (Left); Abdominal hysterectomy; Breast lumpectomy with radioactive seed localization (Right, 12/27/2019);  Total mastectomy (Bilateral, 07/17/2020); Laparoscopic gastric resection (N/A, 12/28/2021); and Rotator cuff repair (Right).   Her family history includes Brain cancer (age of onset: 25) in her cousin; Breast cancer (age of onset: 76) in her mother; Cervical cancer (age of onset: 89) in her sister; Colon cancer (age of onset: 29) in her cousin; Colon cancer (age of onset: 58) in her cousin; Colon polyps in her daughter; Heart attack in her father; Lung cancer (age of onset: 2) in her cousin; Lung cancer (age of onset: 7) in her cousin; Lung cancer (age of onset: 64) in her cousin; Melanoma (age of onset: 2) in her cousin; Pancreatic cancer (age of onset: 61) in her cousin; Uterine cancer (age of onset: 69) in her maternal aunt.She reports that she quit smoking about 22 years ago. Her smoking use included cigarettes. She has never used smokeless tobacco. She reports that she does not drink alcohol and does not use drugs.    ROS Review of Systems  Constitutional: Negative.   HENT: Negative.    Eyes:  Negative for visual disturbance.  Respiratory:  Negative for shortness of breath.   Cardiovascular:  Negative for chest pain.  Gastrointestinal:  Negative for abdominal pain.  Musculoskeletal:  Positive for arthralgias.    Objective:  BP 109/77   Pulse 77   Temp (!) 96.8 F (36 C)   Ht 5\' 5"  (1.651 m)   Wt 260 lb 6.4 oz (118.1 kg)   SpO2 96%   BMI 43.33 kg/m   BP Readings from Last 3 Encounters:  10/24/22 109/77  09/22/22 139/86  08/16/22 (!) 169/87    Wt Readings from Last 3 Encounters:  10/24/22 260 lb 6.4 oz (118.1 kg)  10/17/22 250 lb (113.4 kg)  09/22/22 258 lb 6.4 oz (117.2 kg)     Physical Exam Constitutional:      General: She is not in acute distress.    Appearance: She is well-developed.  Cardiovascular:     Rate and Rhythm: Normal rate and regular rhythm.  Pulmonary:     Breath sounds: Normal breath sounds.  Musculoskeletal:        General: Normal range of  motion.  Skin:    General: Skin is warm and dry.  Neurological:     Mental Status: She is alert and oriented to person, place, and time.       Assessment & Plan:   Bridget Conway "Bridget Conway" was seen today for follow-up and hypertension.  Diagnoses and all orders for this visit:  Arthralgia of multiple sites  Hyperlipidemia, unspecified hyperlipidemia type  Morbid obesity (HCC)  Statin intolerance  Depression, recurrent (HCC)  Other orders -     ezetimibe (ZETIA) 10 MG tablet; Take 1 tablet (10 mg total) by mouth daily. For cholesterol -     pregabalin (LYRICA) 50 MG capsule; 1 qhs X7 days , then 2 qhs X 7d, then 3 qhs X 7d, then 4 qhs       I am having Bridget Conway "Bridget Conway" start on ezetimibe and pregabalin. I am also having her maintain her Multiple Vitamins-Minerals (MULTIVITAMIN PO), Probiotic Product (PROBIOTIC DAILY PO), GLUCOSAMINE-VITAMIN D PO, Triple Omega-3-6-9, Calcium Carb-Cholecalciferol (CALCIUM 600 + D PO), TURMERIC PO, acetaminophen, diphenhydrAMINE-zinc acetate, fluticasone, furosemide, potassium chloride SA, and sertraline.  Allergies as of 10/24/2022       Reactions   Lisinopril Cough   Nsaids    Fluid retention    Repatha [evolocumab]    arthralgia   Statins    Muscle pain   Sulfa Antibiotics Hives   Latex Rash        Medication List        Accurate as of October 24, 2022  7:46 PM. If you have any questions, ask your nurse or doctor.          acetaminophen 650 MG CR tablet Commonly known as: TYLENOL Take 1,300 mg by mouth every 8 (eight) hours as needed for pain.   CALCIUM 600 + D PO Take 1 tablet by mouth daily.   diphenhydrAMINE-zinc acetate cream Commonly known as: BENADRYL Apply 1 Application topically 3 (three) times daily as needed for itching.   ezetimibe 10 MG tablet Commonly known as: Zetia Take 1 tablet (10 mg total) by mouth daily. For cholesterol Started by: Bridget Conway   fluticasone 50 MCG/ACT nasal  spray Commonly known as: FLONASE Place 1 spray into both nostrils daily as needed for allergies.   furosemide 20 MG tablet Commonly known as: LASIX Take 20 mg by mouth daily.   GLUCOSAMINE-VITAMIN D PO Take 2 tablets by mouth daily.   MULTIVITAMIN PO Take 1 tablet by mouth daily.   potassium chloride SA 20 MEQ tablet Commonly known as: KLOR-CON M Take 1 tablet (20 mEq total) by mouth daily.   pregabalin 50 MG capsule Commonly known as: Lyrica 1 qhs X7 days , then 2 qhs X 7d, then 3 qhs X 7d, then 4 qhs Started by: Django Nguyen   PROBIOTIC DAILY PO Take 1 capsule by mouth daily.   sertraline 100 MG tablet Commonly  known as: ZOLOFT Take 1 tablet bu mouth daily   Triple Omega-3-6-9 Caps Take 2 capsules by mouth daily.   TURMERIC PO Take 1,200 mg by mouth daily.         Follow-up: Return in about 1 month (around 11/24/2022).  Mechele Claude, M.D.

## 2022-10-31 NOTE — Telephone Encounter (Signed)
Stop taking the pregabalin. There is no clear substitute. We can look at that next time you are in the office.

## 2022-11-16 ENCOUNTER — Ambulatory Visit (AMBULATORY_SURGERY_CENTER): Payer: Medicare Other | Admitting: Gastroenterology

## 2022-11-16 ENCOUNTER — Encounter: Payer: Self-pay | Admitting: Gastroenterology

## 2022-11-16 VITALS — BP 106/80 | HR 69 | Temp 96.8°F | Resp 18 | Ht 65.0 in | Wt 250.0 lb

## 2022-11-16 DIAGNOSIS — Z800 Family history of malignant neoplasm of digestive organs: Secondary | ICD-10-CM | POA: Diagnosis not present

## 2022-11-16 DIAGNOSIS — D123 Benign neoplasm of transverse colon: Secondary | ICD-10-CM

## 2022-11-16 DIAGNOSIS — F32A Depression, unspecified: Secondary | ICD-10-CM | POA: Diagnosis not present

## 2022-11-16 DIAGNOSIS — Z1211 Encounter for screening for malignant neoplasm of colon: Secondary | ICD-10-CM

## 2022-11-16 DIAGNOSIS — D120 Benign neoplasm of cecum: Secondary | ICD-10-CM

## 2022-11-16 DIAGNOSIS — I10 Essential (primary) hypertension: Secondary | ICD-10-CM | POA: Diagnosis not present

## 2022-11-16 MED ORDER — SODIUM CHLORIDE 0.9 % IV SOLN: 500.0000 mL | Freq: Once | INTRAVENOUS | Status: DC

## 2022-11-16 NOTE — Progress Notes (Signed)
Uneventful anesthetic. Report to pacu rn. Vss. Care resumed by rn. 

## 2022-11-16 NOTE — Op Note (Signed)
[facility] Patient Name: Bridget Conway Procedure Date: 11/16/2022 11:02 AM MRN: [id number] Endoscopist: Corliss Parish , MD, [id number] Age: 67 Referring MD:  Date of Birth: 07-15-1955 Gender: Female Account #: [id number] Procedure:                Colonoscopy Indications:              Screening for colorectal malignant neoplasm, Family                            history of colon cancer in multiple second-degree                            relatives Medicines:                Monitored Anesthesia Care Procedure:                Pre-Anesthesia Assessment:                           - Prior to the procedure, a History and Physical                            was performed, and patient medications and                            allergies were reviewed. The patient's tolerance of                            previous anesthesia was also reviewed. The risks                            and benefits of the procedure and the sedation                            options and risks were discussed with the patient.                            All questions were answered, and informed consent                            was obtained. Prior Anticoagulants: The patient has                            taken no anticoagulant or antiplatelet agents. ASA                            Grade Assessment: III - A patient with severe                            systemic disease. After reviewing the risks and                            benefits, the patient was deemed in satisfactory  condition to undergo the procedure.                           After obtaining informed consent, the colonoscope                            was passed under direct vision. Throughout the                            procedure, the patient's blood pressure, pulse, and                            oxygen saturations were monitored continuously. The                            CF [id number] #[id number] was introduced  through the anus                            and advanced to the the cecum, identified by                            appendiceal orifice and ileocecal valve. The                            colonoscopy was performed without difficulty. The                            patient tolerated the procedure. The quality of the                            bowel preparation was adequate. The ileocecal                            valve, appendiceal orifice, and rectum were                            photographed. Scope In: 11:20:57 AM Scope Out: 11:36:35 AM Scope Withdrawal Time: 0 hours 11 minutes 23 seconds  Total Procedure Duration: 0 hours 15 minutes 38 seconds  Findings:                 The digital rectal exam findings include                            hemorrhoids. Pertinent negatives include no                            palpable rectal lesions.                           The colon (entire examined portion) revealed                            moderately excessive looping.  Multiple small-mouthed diverticula were found in                            the recto-sigmoid colon, sigmoid colon, descending                            colon and transverse colon.                           Two sessile polyps were found in the hepatic                            flexure and cecum. The polyps were 3 to 7 mm in                            size. These polyps were removed with a cold snare.                            Resection and retrieval were complete.                           Normal mucosa was found in the entire colon                            otherwise.                           Non-bleeding non-thrombosed internal hemorrhoids                            were found during retroflexion, during perianal                            exam and during digital exam. The hemorrhoids were                            Grade II (internal hemorrhoids that prolapse but                            reduce  spontaneously). Complications:            No immediate complications. Estimated Blood Loss:     Estimated blood loss was minimal. Impression:               - Hemorrhoids found on digital rectal exam.                           - There was significant looping of the colon.                           - Diverticulosis in the recto-sigmoid colon, in the                            sigmoid colon, in the descending colon and in the  transverse colon.                           - Two 3 to 7 mm polyps at the hepatic flexure and                            in the cecum, removed with a cold snare. Resected                            and retrieved.                           - Normal mucosa in the entire examined colon                            otherwise.                           - Non-bleeding non-thrombosed internal hemorrhoids. Recommendation:           - The patient will be observed post-procedure,                            until all discharge criteria are met.                           - Discharge patient to home.                           - Patient has a contact number available for                            emergencies. The signs and symptoms of potential                            delayed complications were discussed with the                            patient. Return to normal activities tomorrow.                            Written discharge instructions were provided to the                            patient.                           - High fiber diet.                           - Use FiberCon 1-2 tablets PO daily.                           - Continue present medications.                           - Await pathology results.                           -  Repeat colonoscopy in 5-7 years for surveillance.                            With patient having multiple family members with                            history of colon cancer could consider, no matter                             pathology a 5-year follow-up. Will discuss with                            patient further and see what final pathology is.                           - The findings and recommendations were discussed                            with the patient. Corliss Parish, MD 11/16/2022 11:42:34 AM

## 2022-11-16 NOTE — Progress Notes (Signed)
Called to room to assist during endoscopic procedure.  Patient ID and intended procedure confirmed with present staff. Received instructions for my participation in the procedure from the performing physician.  

## 2022-11-16 NOTE — Progress Notes (Signed)
GASTROENTEROLOGY PROCEDURE H&P NOTE   Primary Care Physician: Mechele Claude, MD  HPI: Bridget Conway is a 67 y.o. female who presents for Colonoscopy for screening and previous history of adenomas (normal colon in 2014 - though has history of multiple 2nd degree relatives with colon cancer as well).  Past Medical History:  Diagnosis Date   Allergy    Anxiety    Arthritis    Atypical ductal hyperplasia of right breast    Cancer (HCC)    basal cell CA- below eye   Depression    Family history of brain cancer    Family history of breast cancer    Family history of cervical cancer    Family history of colon cancer    Family history of lung cancer    Family history of melanoma    Family history of pancreatic cancer    Family history of uterine cancer    GERD (gastroesophageal reflux disease)    Heart murmur    History of kidney stones    History of nonmelanoma skin cancer    Hypertension    no meds, has white coat syndrome   IFG (impaired fasting glucose) 2014   Osteoporosis    Pre-diabetes    Wears glasses    Wears partial dentures    upper partial   Past Surgical History:  Procedure Laterality Date   ABDOMINAL HYSTERECTOMY     BREAST LUMPECTOMY WITH RADIOACTIVE SEED LOCALIZATION Right 12/27/2019   Procedure: RIGHT BREAST LUMPECTOMY WITH RADIOACTIVE SEED LOCALIZATION;  Surgeon: Griselda Miner, MD;  Location: [facility];  Service: General;  Laterality: Right;   CARPAL TUNNEL RELEASE Left 07/11/2013   Procedure: LEFT CARPAL TUNNEL RELEASE;  Surgeon: Tami Ribas, MD;  Location: [facility];  Service: Orthopedics;  Laterality: Left;   carpel tunnel  2005   right wrist   COLONOSCOPY     hysterectomy  2002   KNEE SURGERY Left    LAPAROSCOPIC GASTRIC BANDING  2010   LAPAROSCOPIC GASTRIC RESECTION N/A 12/28/2021   Procedure: LAPAROSCOPIC GASTRIC BAND REMOVAL;  Surgeon: Quentin Ore, MD;  Location: [facility];  Service: General;   Laterality: N/A;   ROTATOR CUFF REPAIR Right    TOTAL MASTECTOMY Bilateral 07/17/2020   Procedure: BILATERAL MASTECTOMY;  Surgeon: Chevis Pretty III, MD;  Location: [facility];  Service: General;  Laterality: Bilateral;   Current Outpatient Medications  Medication Sig Dispense Refill   acetaminophen (TYLENOL) 650 MG CR tablet Take 1,300 mg by mouth every 8 (eight) hours as needed for pain.     Calcium Carb-Cholecalciferol (CALCIUM 600 + D PO) Take 1 tablet by mouth daily.     diphenhydrAMINE-zinc acetate (BENADRYL) cream Apply 1 Application topically 3 (three) times daily as needed for itching.     ezetimibe (ZETIA) 10 MG tablet Take 1 tablet (10 mg total) by mouth daily. For cholesterol 90 tablet 3   fluticasone (FLONASE) 50 MCG/ACT nasal spray Place 1 spray into both nostrils daily as needed for allergies. 11.1 mL 5   furosemide (LASIX) 20 MG tablet Take 20 mg by mouth daily.     GLUCOSAMINE-VITAMIN D PO Take 2 tablets by mouth daily.     Multiple Vitamins-Minerals (MULTIVITAMIN PO) Take 1 tablet by mouth daily.     Omega 3-6-9 Fatty Acids (TRIPLE OMEGA-3-6-9) CAPS Take 2 capsules by mouth daily.     potassium chloride SA (KLOR-CON M) 20 MEQ tablet Take 1 tablet (20 mEq  total) by mouth daily. 90 tablet 3   pregabalin (LYRICA) 50 MG capsule 1 qhs X7 days , then 2 qhs X 7d, then 3 qhs X 7d, then 4 qhs 120 capsule 0   Probiotic Product (PROBIOTIC DAILY PO) Take 1 capsule by mouth daily.     sertraline (ZOLOFT) 100 MG tablet Take 1 tablet bu mouth daily 90 tablet 0   TURMERIC PO Take 1,200 mg by mouth daily.     No current facility-administered medications for this visit.    Current Outpatient Medications:    acetaminophen (TYLENOL) 650 MG CR tablet, Take 1,300 mg by mouth every 8 (eight) hours as needed for pain., Disp: , Rfl:    Calcium Carb-Cholecalciferol (CALCIUM 600 + D PO), Take 1 tablet by mouth daily., Disp: , Rfl:    diphenhydrAMINE-zinc acetate (BENADRYL) cream, Apply  1 Application topically 3 (three) times daily as needed for itching., Disp: , Rfl:    ezetimibe (ZETIA) 10 MG tablet, Take 1 tablet (10 mg total) by mouth daily. For cholesterol, Disp: 90 tablet, Rfl: 3   fluticasone (FLONASE) 50 MCG/ACT nasal spray, Place 1 spray into both nostrils daily as needed for allergies., Disp: 11.1 mL, Rfl: 5   furosemide (LASIX) 20 MG tablet, Take 20 mg by mouth daily., Disp: , Rfl:    GLUCOSAMINE-VITAMIN D PO, Take 2 tablets by mouth daily., Disp: , Rfl:    Multiple Vitamins-Minerals (MULTIVITAMIN PO), Take 1 tablet by mouth daily., Disp: , Rfl:    Omega 3-6-9 Fatty Acids (TRIPLE OMEGA-3-6-9) CAPS, Take 2 capsules by mouth daily., Disp: , Rfl:    potassium chloride SA (KLOR-CON M) 20 MEQ tablet, Take 1 tablet (20 mEq total) by mouth daily., Disp: 90 tablet, Rfl: 3   pregabalin (LYRICA) 50 MG capsule, 1 qhs X7 days , then 2 qhs X 7d, then 3 qhs X 7d, then 4 qhs, Disp: 120 capsule, Rfl: 0   Probiotic Product (PROBIOTIC DAILY PO), Take 1 capsule by mouth daily., Disp: , Rfl:    sertraline (ZOLOFT) 100 MG tablet, Take 1 tablet bu mouth daily, Disp: 90 tablet, Rfl: 0   TURMERIC PO, Take 1,200 mg by mouth daily., Disp: , Rfl:  Allergies  Allergen Reactions   Lisinopril Cough   Nsaids     Fluid retention    Repatha [Evolocumab]     arthralgia   Statins     Muscle pain   Sulfa Antibiotics Hives   Latex Rash   Family History  Problem Relation Age of Onset   Breast cancer Mother 64   Heart attack Father    Cervical cancer Sister 58   Uterine cancer Maternal Aunt 35   Melanoma Cousin 70       head (maternal first cousin)   Lung cancer Cousin 18       (paternal first cousin)   Colon cancer Cousin 93       (paternal first cousin)   Lung cancer Cousin 1       (paternal first cousin)   Colon cancer Cousin 81       (paternal first cousin)   Brain cancer Cousin 42       (paternal first cousin)   Lung cancer Cousin 62       (paternal first cousin)   Pancreatic  cancer Cousin 31       (paternal first cousin)   Colon polyps Daughter    Esophageal cancer Neg Hx    Rectal cancer Neg Hx  Stomach cancer Neg Hx    Social History   Socioeconomic History   Marital status: Divorced    Spouse name: Not on file   Number of children: 1   Years of education: Not on file   Highest education level: Some college, no degree  Occupational History   Occupation: retired  Tobacco Use   Smoking status: Former    Current packs/day: 0.00    Types: Cigarettes    Quit date: 07/09/2000    Years since quitting: 22.3   Smokeless tobacco: Never  Vaping Use   Vaping status: Never Used  Substance and Sexual Activity   Alcohol use: No   Drug use: No   Sexual activity: Not on file  Other Topics Concern   Not on file  Social History Narrative   Lives with significant other   Double mastectomy in 07/2020 - Dr Carolynne Edouard   Routine visits with Dr Pamelia Hoit, Oncologist   Social Determinants of Health   Financial Resource Strain: Low Risk  (09/12/2022)   Overall Financial Resource Strain (CARDIA)    Difficulty of Paying Living Expenses: Not hard at all  Recent Concern: Financial Resource Strain - Medium Risk (08/12/2022)   Overall Financial Resource Strain (CARDIA)    Difficulty of Paying Living Expenses: Somewhat hard  Food Insecurity: No Food Insecurity (09/12/2022)   Hunger Vital Sign    Worried About Running Out of Food in the Last Year: Never true    Ran Out of Food in the Last Year: Never true  Transportation Needs: No Transportation Needs (09/12/2022)   PRAPARE - Administrator, Civil Service (Medical): No    Lack of Transportation (Non-Medical): No  Physical Activity: Insufficiently Active (09/12/2022)   Exercise Vital Sign    Days of Exercise per Week: 7 days    Minutes of Exercise per Session: 20 min  Stress: No Stress Concern Present (09/12/2022)   [employer] of Occupational Health - Occupational Stress Questionnaire    Feeling of Stress : Not at  all  Recent Concern: Stress - Stress Concern Present (08/12/2022)   [employer] of Occupational Health - Occupational Stress Questionnaire    Feeling of Stress : Rather much  Social Connections: Moderately Isolated (09/12/2022)   Social Connection and Isolation Panel [NHANES]    Frequency of Communication with Friends and Family: More than three times a week    Frequency of Social Gatherings with Friends and Family: More than three times a week    Attends Religious Services: Never    Database administrator or Organizations: Yes    Attends Engineer, structural: More than 4 times per year    Marital Status: Divorced  Intimate Partner Violence: Not At Risk (09/12/2022)   Humiliation, Afraid, Rape, and Kick questionnaire    Fear of Current or Ex-Partner: No    Emotionally Abused: No    Physically Abused: No    Sexually Abused: No    Physical Exam: Today's Vitals   11/16/22 1057 11/16/22 1058  BP: (!) 164/94   Pulse: 75   Temp: (!) 96.8 F (36 C) (!) 96.8 F (36 C)  TempSrc: Skin   SpO2: 97%   Weight: 250 lb (113.4 kg)   Height: 5\' 5"  (1.651 m)    Body mass index is 41.6 kg/m. GEN: NAD EYE: Sclerae anicteric ENT: MMM CV: Non-tachycardic GI: Soft, NT/ND NEURO:  Alert & Oriented x 3  Lab Results: No results for input(s): "WBC", "HGB", "HCT", "PLT" in  the last 72 hours. BMET No results for input(s): "NA", "K", "CL", "CO2", "GLUCOSE", "BUN", "CREATININE", "CALCIUM" in the last 72 hours. LFT No results for input(s): "PROT", "ALBUMIN", "AST", "ALT", "ALKPHOS", "BILITOT", "BILIDIR", "IBILI" in the last 72 hours. PT/INR No results for input(s): "LABPROT", "INR" in the last 72 hours.   Impression / Plan: This is a 67 y.o.female who presents for Colonoscopy for screening and previous history of adenomas (normal colon in 2014 - though has history of multiple 2nd degree relatives with colon cancer as well).  The risks and benefits of endoscopic evaluation/treatment were  discussed with the patient and/or family; these include but are not limited to the risk of perforation, infection, bleeding, missed lesions, lack of diagnosis, severe illness requiring hospitalization, as well as anesthesia and sedation related illnesses.  The patient's history has been reviewed, patient examined, no change in status, and deemed stable for procedure.  The patient and/or family is agreeable to proceed.    Corliss Parish, MD [facility] Office # [id number]

## 2022-11-16 NOTE — Progress Notes (Signed)
Pt's states no medical or surgical changes since previsit or office visit. 

## 2022-11-16 NOTE — Patient Instructions (Addendum)
High fiber diet. Use FiberCon 1-2 tablets PO daily. Continue present medications. Await pathology results. Repeat colonoscopy in 5-7 years for surveillance.                            With patient having multiple family members with                            history of colon cancer could consider, no matter                            pathology a 5-year follow-up. Will discuss with                            patient further and see what final pathology is.     YOU HAD AN ENDOSCOPIC PROCEDURE TODAY AT THE [facility]:   Refer to the procedure report that was given to you for any specific questions about what was found during the examination.  If the procedure report does not answer your questions, please call your gastroenterologist to clarify.  If you requested that your care partner not be given the details of your procedure findings, then the procedure report has been included in a sealed envelope for you to review at your convenience later.  YOU SHOULD EXPECT: Some feelings of bloating in the abdomen. Passage of more gas than usual.  Walking can help get rid of the air that was put into your GI tract during the procedure and reduce the bloating. If you had a lower endoscopy (such as a colonoscopy or flexible sigmoidoscopy) you may notice spotting of blood in your stool or on the toilet paper. If you underwent a bowel prep for your procedure, you may not have a normal bowel movement for a few days.  Please Note:  You might notice some irritation and congestion in your nose or some drainage.  This is from the oxygen used during your procedure.  There is no need for concern and it should clear up in a day or so.  SYMPTOMS TO REPORT IMMEDIATELY:  Following lower endoscopy (colonoscopy or flexible sigmoidoscopy):  Excessive amounts of blood in the stool  Significant tenderness or worsening of abdominal pains  Swelling of the abdomen that is new, acute  Fever of 100F or  higher For urgent or emergent issues, a gastroenterologist can be reached at any hour by calling [phone]. Do not use MyChart messaging for urgent concerns.    DIET:  We do recommend a small meal at first, but then you may proceed to your regular diet.  Drink plenty of fluids but you should avoid alcoholic beverages for 24 hours.  ACTIVITY:  You should plan to take it easy for the rest of today and you should NOT DRIVE or use heavy machinery until tomorrow (because of the sedation medicines used during the test).    FOLLOW UP: Our staff will call the number listed on your records the next business day following your procedure.  We will call around 7:15- 8:00 am to check on you and address any questions or concerns that you may have regarding the information given to you following your procedure. If we do not reach you, we will leave a message.     If any biopsies were taken you will be contacted  by phone or by letter within the next 1-3 weeks.  Please call us at [phone] if you have not heard about the biopsies in 3 weeks.    SIGNATURES/CONFIDENTIALITY: You and/or your care partner have signed paperwork which will be entered into your electronic medical record.  These signatures attest to the fact that that the information above on your After Visit Summary has been reviewed and is understood.  Full responsibility of the confidentiality of this discharge information lies with you and/or your care-partner.

## 2022-11-17 ENCOUNTER — Telehealth: Payer: Self-pay | Admitting: Pharmacy Technician

## 2022-11-17 ENCOUNTER — Other Ambulatory Visit (HOSPITAL_COMMUNITY): Payer: Self-pay

## 2022-11-17 ENCOUNTER — Telehealth: Payer: Self-pay

## 2022-11-17 NOTE — Telephone Encounter (Signed)
Pharmacy Patient Advocate Encounter   Received notification from CoverMyMeds that prior authorization for Repatha 140MG /ML syringes is required/requested.   Insurance verification completed.   The patient is insured through [facility] .   Per test claim: The current 11/17/2022 day co-pay is, $11.20.  No PA needed at this time. This test claim was processed through [facility]- copay amounts may vary at other pharmacies due to pharmacy/plan contracts, or as the patient moves through the different stages of their insurance plan.

## 2022-11-17 NOTE — Telephone Encounter (Signed)
Left message on follow up call. 

## 2022-11-23 ENCOUNTER — Encounter: Payer: Self-pay | Admitting: Gastroenterology

## 2022-11-24 ENCOUNTER — Ambulatory Visit: Payer: Medicare Other | Admitting: Family Medicine

## 2022-11-24 DIAGNOSIS — M546 Pain in thoracic spine: Secondary | ICD-10-CM | POA: Diagnosis not present

## 2022-11-24 DIAGNOSIS — M47816 Spondylosis without myelopathy or radiculopathy, lumbar region: Secondary | ICD-10-CM | POA: Diagnosis not present

## 2022-11-24 DIAGNOSIS — M542 Cervicalgia: Secondary | ICD-10-CM | POA: Diagnosis not present

## 2022-11-24 DIAGNOSIS — M9903 Segmental and somatic dysfunction of lumbar region: Secondary | ICD-10-CM | POA: Diagnosis not present

## 2022-11-24 DIAGNOSIS — M9901 Segmental and somatic dysfunction of cervical region: Secondary | ICD-10-CM | POA: Diagnosis not present

## 2022-11-24 DIAGNOSIS — M9902 Segmental and somatic dysfunction of thoracic region: Secondary | ICD-10-CM | POA: Diagnosis not present

## 2022-12-05 ENCOUNTER — Encounter: Payer: Self-pay | Admitting: Family Medicine

## 2022-12-05 ENCOUNTER — Ambulatory Visit: Payer: Medicare Other | Admitting: Family Medicine

## 2022-12-05 VITALS — BP 152/76 | HR 78 | Temp 97.7°F | Ht 65.0 in | Wt 254.8 lb

## 2022-12-05 DIAGNOSIS — I10 Essential (primary) hypertension: Secondary | ICD-10-CM

## 2022-12-05 DIAGNOSIS — Z789 Other specified health status: Secondary | ICD-10-CM

## 2022-12-05 DIAGNOSIS — E785 Hyperlipidemia, unspecified: Secondary | ICD-10-CM

## 2022-12-05 DIAGNOSIS — F331 Major depressive disorder, recurrent, moderate: Secondary | ICD-10-CM | POA: Diagnosis not present

## 2022-12-05 MED ORDER — SERTRALINE HCL 100 MG PO TABS
ORAL_TABLET | ORAL | 0 refills | Status: AC
Start: 2022-12-05 — End: ?

## 2022-12-05 NOTE — Progress Notes (Signed)
Subjective:  Patient ID: Bridget Conway, female    DOB: May 21, 1955  Age: 67 y.o. MRN: [id number]  CC: Follow-up   HPI Bridget Conway presents for  in for follow-up of elevated cholesterol. Doing well without complaints. Denies side effects  including myalgia and arthralgia and nausea. Currently no chest pain, shortness of breath or other cardiovascular related symptoms noted. Intolerant of statins. Taking zetia.    presents for  follow-up of hypertension. Patient has no history of headache chest pain or shortness of breath or recent cough. Patient also denies symptoms of TIA such as focal numbness or weakness. Patient denies side effects from medication. States taking it regularly. Taking lasix for BP & edema.        12/05/2022   11:30 AM 10/24/2022    9:03 AM 09/22/2022   11:18 AM  Depression screen PHQ 2/9  Decreased Interest 0 0 0  Down, Depressed, Hopeless 0 0 0  PHQ - 2 Score 0 0 0    History Ahsaki has a past medical history of Allergy, Anxiety, Arthritis, Atypical ductal hyperplasia of right breast, Cancer (HCC), Depression, Family history of brain cancer, Family history of breast cancer, Family history of cervical cancer, Family history of colon cancer, Family history of lung cancer, Family history of melanoma, Family history of pancreatic cancer, Family history of uterine cancer, GERD (gastroesophageal reflux disease), Gross hematuria (02/25/2018), Heart murmur, History of kidney stones, History of nonmelanoma skin cancer, Hypertension, IFG (impaired fasting glucose) (2014), Osteoporosis, Pre-diabetes, Wears glasses, and Wears partial dentures.   She has a past surgical history that includes carpel tunnel (2005); hysterectomy (2002); Laparoscopic gastric banding (2010); Colonoscopy; Carpal tunnel release (Left, 07/11/2013); Knee surgery (Left); Abdominal hysterectomy; Breast lumpectomy with radioactive seed localization (Right, 12/27/2019); Total mastectomy (Bilateral,  07/17/2020); Laparoscopic gastric resection (N/A, 12/28/2021); and Rotator cuff repair (Right).   Her family history includes Brain cancer (age of onset: 41) in her cousin; Breast cancer (age of onset: 82) in her mother; Cervical cancer (age of onset: 75) in her sister; Colon cancer (age of onset: 82) in her cousin; Colon cancer (age of onset: 35) in her cousin; Colon polyps in her daughter; Heart attack in her father; Lung cancer (age of onset: 44) in her cousin; Lung cancer (age of onset: 26) in her cousin; Lung cancer (age of onset: 77) in her cousin; Melanoma (age of onset: 50) in her cousin; Pancreatic cancer (age of onset: 55) in her cousin; Uterine cancer (age of onset: 65) in her maternal aunt.She reports that she quit smoking about 22 years ago. Her smoking use included cigarettes. She has never used smokeless tobacco. She reports that she does not drink alcohol and does not use drugs.    ROS Review of Systems  Constitutional: Negative.   HENT: Negative.    Eyes:  Negative for visual disturbance.  Respiratory:  Negative for shortness of breath.   Cardiovascular:  Negative for chest pain.  Gastrointestinal:  Negative for abdominal pain.  Musculoskeletal:  Negative for arthralgias.    Objective:  BP (!) 152/76   Pulse 78   Temp 97.7 F (36.5 C)   Ht 5\' 5"  (1.651 m)   Wt 254 lb 12.8 oz (115.6 kg)   SpO2 96%   BMI 42.40 kg/m   BP Readings from Last 3 Encounters:  12/05/22 (!) 152/76  11/16/22 106/80  10/24/22 109/77    Wt Readings from Last 3 Encounters:  12/05/22 254 lb 12.8 oz (115.6 kg)  11/16/22 250  lb (113.4 kg)  10/24/22 260 lb 6.4 oz (118.1 kg)     Physical Exam Constitutional:      General: She is not in acute distress.    Appearance: She is well-developed.  Cardiovascular:     Rate and Rhythm: Normal rate and regular rhythm.  Pulmonary:     Breath sounds: Normal breath sounds.  Musculoskeletal:        General: Normal range of motion.  Skin:     General: Skin is warm and dry.  Neurological:     Mental Status: She is alert and oriented to person, place, and time.       Assessment & Plan:   Bridget "Koey Gethers" was seen today for follow-up.  Diagnoses and all orders for this visit:  Hyperlipidemia, unspecified hyperlipidemia type  Moderate episode of recurrent major depressive disorder (HCC) -     sertraline (ZOLOFT) 100 MG tablet; Take 1 tablet bu mouth daily  Primary hypertension  Statin intolerance       I am having [name] "Bridget Conway" maintain her Multiple Vitamins-Minerals (MULTIVITAMIN PO), Probiotic Product (PROBIOTIC DAILY PO), GLUCOSAMINE-VITAMIN D PO, Triple Omega-3-6-9, Calcium Carb-Cholecalciferol (CALCIUM 600 + D PO), TURMERIC PO, acetaminophen, diphenhydrAMINE-zinc acetate, fluticasone, furosemide, potassium chloride SA, ezetimibe, and sertraline.  Allergies as of 12/05/2022       Reactions   Lisinopril Cough   Nsaids    Fluid retention    Repatha [evolocumab]    arthralgia   Statins    Muscle pain   Sulfa Antibiotics Hives   Latex Rash        Medication List        Accurate as of December 05, 2022  8:39 PM. If you have any questions, ask your nurse or doctor.          acetaminophen 650 MG CR tablet Commonly known as: TYLENOL Take 1,300 mg by mouth every 8 (eight) hours as needed for pain.   CALCIUM 600 + D PO Take 1 tablet by mouth daily.   diphenhydrAMINE-zinc acetate cream Commonly known as: BENADRYL Apply 1 Application topically 3 (three) times daily as needed for itching.   ezetimibe 10 MG tablet Commonly known as: Zetia Take 1 tablet (10 mg total) by mouth daily. For cholesterol   fluticasone 50 MCG/ACT nasal spray Commonly known as: FLONASE Place 1 spray into both nostrils daily as needed for allergies.   furosemide 20 MG tablet Commonly known as: LASIX Take 20 mg by mouth daily.   GLUCOSAMINE-VITAMIN D PO Take 2 tablets by mouth daily.   MULTIVITAMIN  PO Take 1 tablet by mouth daily.   potassium chloride SA 20 MEQ tablet Commonly known as: KLOR-CON M Take 1 tablet (20 mEq total) by mouth daily.   PROBIOTIC DAILY PO Take 1 capsule by mouth daily.   sertraline 100 MG tablet Commonly known as: ZOLOFT Take 1 tablet bu mouth daily   Triple Omega-3-6-9 Caps Take 2 capsules by mouth daily.   TURMERIC PO Take 1,200 mg by mouth daily.         Follow-up: Return in about 6 months (around 06/07/2023).  Mechele Claude, M.D.

## 2022-12-21 DIAGNOSIS — M9902 Segmental and somatic dysfunction of thoracic region: Secondary | ICD-10-CM | POA: Diagnosis not present

## 2022-12-21 DIAGNOSIS — M9903 Segmental and somatic dysfunction of lumbar region: Secondary | ICD-10-CM | POA: Diagnosis not present

## 2022-12-21 DIAGNOSIS — M546 Pain in thoracic spine: Secondary | ICD-10-CM | POA: Diagnosis not present

## 2022-12-21 DIAGNOSIS — M47816 Spondylosis without myelopathy or radiculopathy, lumbar region: Secondary | ICD-10-CM | POA: Diagnosis not present

## 2022-12-21 DIAGNOSIS — M9901 Segmental and somatic dysfunction of cervical region: Secondary | ICD-10-CM | POA: Diagnosis not present

## 2022-12-21 DIAGNOSIS — M542 Cervicalgia: Secondary | ICD-10-CM | POA: Diagnosis not present

## 2023-01-11 ENCOUNTER — Other Ambulatory Visit: Payer: Self-pay | Admitting: Family Medicine

## 2023-01-11 DIAGNOSIS — F331 Major depressive disorder, recurrent, moderate: Secondary | ICD-10-CM

## 2023-01-27 ENCOUNTER — Other Ambulatory Visit: Payer: Medicare Other

## 2023-01-27 ENCOUNTER — Other Ambulatory Visit: Payer: Self-pay | Admitting: Family Medicine

## 2023-01-27 DIAGNOSIS — E119 Type 2 diabetes mellitus without complications: Secondary | ICD-10-CM | POA: Diagnosis not present

## 2023-01-27 DIAGNOSIS — I119 Hypertensive heart disease without heart failure: Secondary | ICD-10-CM

## 2023-01-27 DIAGNOSIS — E785 Hyperlipidemia, unspecified: Secondary | ICD-10-CM

## 2023-01-27 DIAGNOSIS — E559 Vitamin D deficiency, unspecified: Secondary | ICD-10-CM

## 2023-01-27 DIAGNOSIS — I10 Essential (primary) hypertension: Secondary | ICD-10-CM

## 2023-01-27 LAB — BAYER DCA HB A1C WAIVED: HB A1C (BAYER DCA - WAIVED): 5.2 % (ref 4.8–5.6)

## 2023-01-28 LAB — CMP14+EGFR
ALT: 13 IU/L (ref 0–32)
AST: 19 IU/L (ref 0–40)
Albumin: 4 g/dL (ref 3.9–4.9)
Alkaline Phosphatase: 82 IU/L (ref 44–121)
BUN/Creatinine Ratio: 23 (ref 12–28)
BUN: 14 mg/dL (ref 8–27)
Bilirubin Total: 0.4 mg/dL (ref 0.0–1.2)
CO2: 25 mmol/L (ref 20–29)
Calcium: 8.7 mg/dL (ref 8.7–10.3)
Chloride: 100 mmol/L (ref 96–106)
Creatinine, Ser: 0.61 mg/dL (ref 0.57–1.00)
Globulin, Total: 2.9 g/dL (ref 1.5–4.5)
Glucose: 97 mg/dL (ref 70–99)
Potassium: 3.8 mmol/L (ref 3.5–5.2)
Sodium: 141 mmol/L (ref 134–144)
Total Protein: 6.9 g/dL (ref 6.0–8.5)
eGFR: 97 mL/min/{1.73_m2} (ref >59)

## 2023-01-28 LAB — CBC WITH DIFFERENTIAL/PLATELET
Basophils Absolute: 0 10*3/uL (ref 0.0–0.2)
Basos: 1 %
EOS (ABSOLUTE): 0.1 10*3/uL (ref 0.0–0.4)
Eos: 2 %
Hematocrit: 42.7 % (ref 34.0–46.6)
Hemoglobin: 14.3 g/dL (ref 11.1–15.9)
Immature Grans (Abs): 0 10*3/uL (ref 0.0–0.1)
Immature Granulocytes: 0 %
Lymphocytes Absolute: 2.3 10*3/uL (ref 0.7–3.1)
Lymphs: 35 %
MCH: 32.5 pg (ref 26.6–33.0)
MCHC: 33.5 g/dL (ref 31.5–35.7)
MCV: 97 fL (ref 79–97)
Monocytes Absolute: 0.5 10*3/uL (ref 0.1–0.9)
Monocytes: 8 %
Neutrophils Absolute: 3.6 10*3/uL (ref 1.4–7.0)
Neutrophils: 54 %
Platelets: 248 10*3/uL (ref 150–450)
RBC: 4.4 x10E6/uL (ref 3.77–5.28)
RDW: 12.2 % (ref 11.7–15.4)
WBC: 6.6 10*3/uL (ref 3.4–10.8)

## 2023-01-28 LAB — MICROALBUMIN / CREATININE URINE RATIO
Creatinine, Urine: 107.3 mg/dL
Microalb/Creat Ratio: 20 mg/g{creat} (ref 0–29)
Microalbumin, Urine: 21.7 ug/mL

## 2023-01-28 LAB — VITAMIN D 25 HYDROXY (VIT D DEFICIENCY, FRACTURES): Vit D, 25-Hydroxy: 38.4 ng/mL (ref 30.0–100.0)

## 2023-01-28 LAB — LIPID PANEL
Cholesterol, Total: 184 mg/dL (ref 100–199)
HDL: 43 mg/dL (ref >39)
LDL CALC COMMENT:: 4.3 ratio (ref 0.0–4.4)
LDL Chol Calc (NIH): 114 mg/dL — ABNORMAL HIGH (ref 0–99)
Triglycerides: 155 mg/dL — ABNORMAL HIGH (ref 0–149)
VLDL Cholesterol Cal: 27 mg/dL (ref 5–40)

## 2023-01-30 ENCOUNTER — Encounter: Payer: Self-pay | Admitting: Family Medicine

## 2023-01-30 ENCOUNTER — Ambulatory Visit (INDEPENDENT_AMBULATORY_CARE_PROVIDER_SITE_OTHER): Payer: Medicare Other | Admitting: Family Medicine

## 2023-01-30 VITALS — BP 133/70 | HR 77 | Temp 97.4°F | Ht 65.0 in | Wt 251.0 lb

## 2023-01-30 DIAGNOSIS — Z0001 Encounter for general adult medical examination with abnormal findings: Secondary | ICD-10-CM

## 2023-01-30 DIAGNOSIS — M2550 Pain in unspecified joint: Secondary | ICD-10-CM

## 2023-01-30 DIAGNOSIS — E785 Hyperlipidemia, unspecified: Secondary | ICD-10-CM | POA: Diagnosis not present

## 2023-01-30 DIAGNOSIS — R011 Cardiac murmur, unspecified: Secondary | ICD-10-CM

## 2023-01-30 DIAGNOSIS — F339 Major depressive disorder, recurrent, unspecified: Secondary | ICD-10-CM

## 2023-01-30 DIAGNOSIS — R12 Heartburn: Secondary | ICD-10-CM | POA: Diagnosis not present

## 2023-01-30 DIAGNOSIS — Z23 Encounter for immunization: Secondary | ICD-10-CM

## 2023-01-30 DIAGNOSIS — I119 Hypertensive heart disease without heart failure: Secondary | ICD-10-CM

## 2023-01-30 DIAGNOSIS — R1011 Right upper quadrant pain: Secondary | ICD-10-CM

## 2023-01-30 DIAGNOSIS — Z789 Other specified health status: Secondary | ICD-10-CM

## 2023-01-30 MED ORDER — ACETAMINOPHEN 500 MG PO TABS: 1000.0000 mg | ORAL_TABLET | Freq: Three times a day (TID) | ORAL | 99 refills | Status: AC

## 2023-01-30 MED ORDER — FENOFIBRATE 160 MG PO TABS: 160.0000 mg | ORAL_TABLET | Freq: Every day | ORAL | 3 refills | Status: DC

## 2023-01-30 NOTE — Progress Notes (Signed)
Subjective:  Patient ID: Bridget Conway, female    DOB: 04/03/1956  Age: 67 y.o. MRN: [id number]  CC: Annual Exam   HPI Bridget Conway presents for physical exam.      01/30/2023   10:57 AM 12/05/2022   11:30 AM 10/24/2022    9:03 AM  Depression screen PHQ 2/9  Decreased Interest 0 0 0  Down, Depressed, Hopeless 0 0 0  PHQ - 2 Score 0 0 0    History Bridget Conway has a past medical history of Allergy, Anxiety, Arthritis, Atypical ductal hyperplasia of both breasts (07/17/2020), Atypical ductal hyperplasia of right breast, Cancer (HCC), Depression, Family history of brain cancer, Family history of breast cancer, Family history of cervical cancer, Family history of colon cancer, Family history of lung cancer, Family history of melanoma, Family history of pancreatic cancer, Family history of uterine cancer, GERD (gastroesophageal reflux disease), Gross hematuria (02/25/2018), Heart murmur, History of kidney stones, History of nonmelanoma skin cancer, Hypertension, IFG (impaired fasting glucose) (2014), Osteoporosis, Pre-diabetes, 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   She has a past surgical history that includes carpel tunnel (2005); hysterectomy (2002); Laparoscopic gastric banding (2010); Colonoscopy; Carpal tunnel release (Left, 07/11/2013); Knee surgery (Left); Abdominal hysterectomy; Breast lumpectomy with radioactive seed localization (Right, 12/27/2019); Total mastectomy (Bilateral, 07/17/2020); Laparoscopic gastric resection (N/A, 12/28/2021); and Rotator cuff repair (Right).   Her family history includes Brain cancer (age of onset: 36) in her cousin; Breast cancer (age of onset: 63) in her mother; Cervical cancer (age of onset: 68) in her sister; Colon cancer (age of onset: 82) in her cousin; Colon cancer (age of onset: 87) in her cousin; Colon polyps in her daughter; Heart attack in her father; Lung cancer (age  of onset: 74) in her cousin; Lung cancer (age of onset: 83) in her cousin; Lung cancer (age of onset: 70) in her cousin; Melanoma (age of onset: 3) in her cousin; Pancreatic cancer (age of onset: 6) in her cousin; Uterine cancer (age of onset: 98) in her maternal aunt.She reports that she quit smoking about 22 years ago. Her smoking use included cigarettes. She has never used smokeless tobacco. She reports that she does not drink alcohol and does not use drugs.    ROS Review of Systems  Constitutional:  Negative for appetite change, chills, diaphoresis, fatigue, fever and unexpected weight change.  HENT:  Negative for congestion, ear pain, hearing loss, postnasal drip, rhinorrhea, sneezing, sore throat and trouble swallowing.   Eyes:  Negative for pain.  Respiratory:  Negative for cough, chest tightness and shortness of breath.   Cardiovascular:  Negative for chest pain and palpitations.  Gastrointestinal:  Negative for abdominal pain, constipation, diarrhea, nausea and vomiting.  Endocrine: Negative for cold intolerance, heat intolerance, polydipsia, polyphagia and polyuria.  Genitourinary:  Negative for dysuria, frequency and menstrual problem.  Musculoskeletal:  Positive for arthralgias (hips, knees & shoulders uses Aleve prn) and back pain. Negative for joint swelling.  Skin:  Negative for rash.  Allergic/Immunologic: Negative for environmental allergies.  Neurological:  Negative for dizziness, weakness, numbness and headaches.  Psychiatric/Behavioral:  Negative for agitation and dysphoric mood.     Objective:  BP 133/70   Pulse 77   Temp (!) 97.4 F (36.3 C)   Ht 5\' 5"  (1.651 m)   Wt 251 lb (113.9 kg)   SpO2 94%   BMI 41.77 kg/m   BP Readings from Last 3 Encounters:  01/30/23 133/70  12/05/22 (!) 152/76  11/16/22 106/80    Wt Readings from Last 3 Encounters:  01/30/23 251 lb (113.9 kg)  12/05/22 254 lb 12.8 oz (115.6 kg)  11/16/22 250 lb (113.4 kg)     Physical  Exam Constitutional:      General: She is not in acute distress.    Appearance: She is well-developed.  HENT:     Head: Normocephalic and atraumatic.  Eyes:     Conjunctiva/sclera: Conjunctivae normal.     Pupils: Pupils are equal, round, and reactive to light.  Neck:     Thyroid: No thyromegaly.  Cardiovascular:     Rate and Rhythm: Normal rate and regular rhythm.     Heart sounds: Normal heart sounds. No murmur heard. Pulmonary:     Effort: Pulmonary effort is normal. No respiratory distress.     Breath sounds: Normal breath sounds. No wheezing or rales.  Abdominal:     General: Bowel sounds are normal. There is no distension.     Palpations: Abdomen is soft.     Tenderness: There is no abdominal tenderness.  Musculoskeletal:        General: Normal range of motion.     Cervical back: Normal range of motion and neck supple.  Lymphadenopathy:     Cervical: No cervical adenopathy.  Skin:    General: Skin is warm and dry.  Neurological:     Mental Status: She is alert and oriented to person, place, and time.  Psychiatric:        Behavior: Behavior normal.        Thought Content: Thought content normal.        Judgment: Judgment normal.       Assessment & Plan:   Bridget "Yoana Laracuente" was seen today for annual exam.  Diagnoses and all orders for this visit:  Hypertension with heart disease  Morbid obesity (HCC)  Depression, recurrent (HCC)  Hyperlipidemia, unspecified hyperlipidemia type  Statin intolerance  Heart murmur  Arthralgia of multiple sites  Heartburn -     US Abdomen Complete; Future  RUQ pain -     US Abdomen Complete; Future  Other orders -     fenofibrate 160 MG tablet; Take 1 tablet (160 mg total) by mouth daily. For cholesterol and triglyceride -     acetaminophen (TYLENOL) 500 MG tablet; Take 2 tablets (1,000 mg total) by mouth 3 (three) times daily.       I have discontinued Bridget Rings "Kyliah Jane"'s GLUCOSAMINE-VITAMIN D PO,  acetaminophen, and fluticasone. I am also having her start on fenofibrate and acetaminophen. Additionally, I am having her maintain her Multiple Vitamins-Minerals (MULTIVITAMIN PO), Probiotic Product (PROBIOTIC DAILY PO), Triple Omega-3-6-9, Calcium Carb-Cholecalciferol (CALCIUM 600 + D PO), TURMERIC PO, diphenhydrAMINE-zinc acetate, furosemide, potassium chloride SA, ezetimibe, and sertraline.  Allergies as of 01/30/2023       Reactions   Lisinopril Cough   Nsaids    Fluid retention    Repatha [evolocumab]    arthralgia   Statins    Muscle pain   Sulfa Antibiotics Hives   Latex Rash        Medication List        Accurate as of January 30, 2023 11:41 AM. If you have any questions, ask your nurse or doctor.          STOP taking these medications    acetaminophen 650 MG CR tablet Commonly known as: TYLENOL Replaced by: acetaminophen 500 MG tablet Stopped by: Broadus John  Bridget Conway   fluticasone 50 MCG/ACT nasal spray Commonly known as: FLONASE Stopped by: Dafna Romo   GLUCOSAMINE-VITAMIN D PO Stopped by: Gifford Ballon       TAKE these medications    acetaminophen 500 MG tablet Commonly known as: TYLENOL Take 2 tablets (1,000 mg total) by mouth 3 (three) times daily. Replaces: acetaminophen 650 MG CR tablet Started by: Falynn Ailey   CALCIUM 600 + D PO Take 1 tablet by mouth daily.   diphenhydrAMINE-zinc acetate cream Commonly known as: BENADRYL Apply 1 Application topically 3 (three) times daily as needed for itching.   ezetimibe 10 MG tablet Commonly known as: Zetia Take 1 tablet (10 mg total) by mouth daily. For cholesterol   fenofibrate 160 MG tablet Take 1 tablet (160 mg total) by mouth daily. For cholesterol and triglyceride Started by: Lance Huaracha   furosemide 20 MG tablet Commonly known as: LASIX Take 20 mg by mouth daily.   MULTIVITAMIN PO Take 1 tablet by mouth daily.   potassium chloride SA 20 MEQ tablet Commonly known as: KLOR-CON  M Take 1 tablet (20 mEq total) by mouth daily.   PROBIOTIC DAILY PO Take 1 capsule by mouth daily.   sertraline 100 MG tablet Commonly known as: ZOLOFT TAKE ONE (1) TABLET BY MOUTH EVERY DAY   Triple Omega-3-6-9 Caps Take 2 capsules by mouth daily.   TURMERIC PO Take 1,200 mg by mouth daily.         Follow-up: Return in about 6 months (around 07/31/2023).  Mechele Claude, M.D.

## 2023-02-09 ENCOUNTER — Ambulatory Visit (HOSPITAL_COMMUNITY)
Admission: RE | Admit: 2023-02-09 | Discharge: 2023-02-09 | Disposition: A | Discharge status: Home / Self Care | Payer: Medicare Other | Source: Ambulatory Visit | Attending: Family Medicine | Admitting: Family Medicine

## 2023-02-09 DIAGNOSIS — R12 Heartburn: Secondary | ICD-10-CM | POA: Insufficient documentation

## 2023-02-09 DIAGNOSIS — K8020 Calculus of gallbladder without cholecystitis without obstruction: Secondary | ICD-10-CM | POA: Diagnosis not present

## 2023-02-09 DIAGNOSIS — R1011 Right upper quadrant pain: Secondary | ICD-10-CM | POA: Diagnosis not present

## 2023-02-09 DIAGNOSIS — K219 Gastro-esophageal reflux disease without esophagitis: Secondary | ICD-10-CM | POA: Diagnosis not present

## 2023-02-09 DIAGNOSIS — I10 Essential (primary) hypertension: Secondary | ICD-10-CM | POA: Diagnosis not present

## 2023-02-22 ENCOUNTER — Encounter: Payer: Self-pay | Admitting: Family Medicine

## 2023-03-15 ENCOUNTER — Other Ambulatory Visit: Payer: Self-pay | Admitting: Internal Medicine

## 2023-03-30 ENCOUNTER — Other Ambulatory Visit: Payer: Self-pay | Admitting: Family Medicine

## 2023-03-30 DIAGNOSIS — F331 Major depressive disorder, recurrent, moderate: Secondary | ICD-10-CM

## 2023-04-18 DIAGNOSIS — M546 Pain in thoracic spine: Secondary | ICD-10-CM | POA: Diagnosis not present

## 2023-04-18 DIAGNOSIS — M47816 Spondylosis without myelopathy or radiculopathy, lumbar region: Secondary | ICD-10-CM | POA: Diagnosis not present

## 2023-04-18 DIAGNOSIS — M47812 Spondylosis without myelopathy or radiculopathy, cervical region: Secondary | ICD-10-CM | POA: Diagnosis not present

## 2023-04-20 ENCOUNTER — Ambulatory Visit: Payer: Medicare Other | Admitting: Internal Medicine

## 2023-04-24 ENCOUNTER — Ambulatory Visit: Payer: Self-pay | Admitting: Internal Medicine

## 2023-05-16 DIAGNOSIS — M47812 Spondylosis without myelopathy or radiculopathy, cervical region: Secondary | ICD-10-CM | POA: Diagnosis not present

## 2023-05-16 DIAGNOSIS — M546 Pain in thoracic spine: Secondary | ICD-10-CM | POA: Diagnosis not present

## 2023-05-16 DIAGNOSIS — M47816 Spondylosis without myelopathy or radiculopathy, lumbar region: Secondary | ICD-10-CM | POA: Diagnosis not present

## 2023-05-25 ENCOUNTER — Other Ambulatory Visit: Payer: Self-pay | Admitting: Internal Medicine

## 2023-05-29 DIAGNOSIS — L814 Other melanin hyperpigmentation: Secondary | ICD-10-CM | POA: Diagnosis not present

## 2023-05-29 DIAGNOSIS — L821 Other seborrheic keratosis: Secondary | ICD-10-CM | POA: Diagnosis not present

## 2023-05-29 DIAGNOSIS — Z85828 Personal history of other malignant neoplasm of skin: Secondary | ICD-10-CM | POA: Diagnosis not present

## 2023-06-13 DIAGNOSIS — M47812 Spondylosis without myelopathy or radiculopathy, cervical region: Secondary | ICD-10-CM | POA: Diagnosis not present

## 2023-06-13 DIAGNOSIS — M47816 Spondylosis without myelopathy or radiculopathy, lumbar region: Secondary | ICD-10-CM | POA: Diagnosis not present

## 2023-06-13 DIAGNOSIS — M546 Pain in thoracic spine: Secondary | ICD-10-CM | POA: Diagnosis not present

## 2023-06-22 ENCOUNTER — Other Ambulatory Visit: Payer: Self-pay | Admitting: Nurse Practitioner

## 2023-06-22 ENCOUNTER — Other Ambulatory Visit: Payer: Self-pay | Admitting: Family Medicine

## 2023-06-26 ENCOUNTER — Ambulatory Visit: Payer: Self-pay | Attending: Internal Medicine | Admitting: Internal Medicine

## 2023-06-26 ENCOUNTER — Ambulatory Visit

## 2023-06-26 ENCOUNTER — Telehealth: Payer: Self-pay | Admitting: Nurse Practitioner

## 2023-06-26 ENCOUNTER — Encounter: Payer: Self-pay | Admitting: Internal Medicine

## 2023-06-26 ENCOUNTER — Other Ambulatory Visit: Payer: Self-pay | Admitting: Internal Medicine

## 2023-06-26 VITALS — BP 138/80 | HR 76 | Ht 65.0 in | Wt 248.6 lb

## 2023-06-26 DIAGNOSIS — I350 Nonrheumatic aortic (valve) stenosis: Secondary | ICD-10-CM | POA: Diagnosis not present

## 2023-06-26 DIAGNOSIS — R002 Palpitations: Secondary | ICD-10-CM | POA: Diagnosis not present

## 2023-06-26 DIAGNOSIS — I491 Atrial premature depolarization: Secondary | ICD-10-CM

## 2023-06-26 DIAGNOSIS — I10 Essential (primary) hypertension: Secondary | ICD-10-CM | POA: Diagnosis not present

## 2023-06-26 NOTE — Progress Notes (Signed)
 Cardiology Office Note  Date: 06/26/2023   ID: Bridget Conway, [place] [birth year], MRN [id number]  PCP:  Mechele Claude, MD  Cardiologist:  Marjo Bicker, MD Electrophysiologist:  None   Reason for Office Visit: Follow-up visit for HLD   History of Present Illness: Bridget Conway is a 68 y.o. female known to have HTN, prediabetes, HLD present to cardiology clinic for follow-up visit.  Patient was prior seen by [facility] cardiovascular in [place] when echo and stress test was performed in 2019. Echo report not available and stress test reviewed from 2019 which showed no evidence of ischemia.  She has been intolerant to statins.  She reported that she has no issues with Zetia.  She was started on Repatha but she had severe arthralgia and stated this was the worst she felt.  Currently on fenofibrate 160 mg once daily, Zetia 10 mg once daily and fish oil supplements.  She has no symptoms except for palpitations where she describes them as skipped beats occurs almost daily.  She never underwent event monitor before.  She is active at baseline but does not have any exercise regimen.  She denies have any symptoms of angina, DOE, dizziness, presyncope, syncope.  Denied smoking cigarettes, alcohol use or illicit drug abuse. Family history is positive for heart attack in her dad and he died in his 26s, her sister had valve replacement and had a stroke.  Patient tried multiple statins (3 of them, unable to recall the names) with low and high doses and with decreased frequency, however she continued to have myalgias and hence statins were discontinued.  Has symptoms with Zetia as well.  Does not qualify for bempedoic acid due to no history of FH.   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  Last Assessment & Plan:   Formatting of this note might be different from the original.  Mammogram 10/10/2019: Indeterminate calcifications spanning 1.2 cm superior central right breast  Right breast biopsy 10/17/2019: Focal atypical ductal hyperplasia, microscopic intraductal papilloma with calcifications  12/27/2019: Right lumpectomy: Loraine Leriche   Wears glasses    Wears partial dentures    upper partial    Past Surgical History:  Procedure Laterality Date   ABDOMINAL HYSTERECTOMY     BREAST LUMPECTOMY WITH RADIOACTIVE  SEED LOCALIZATION Right 12/27/2019   Procedure: RIGHT BREAST LUMPECTOMY WITH RADIOACTIVE SEED LOCALIZATION;  Surgeon: Griselda Miner, MD;  Location: [facility];  Service: General;  Laterality: Right;   CARPAL TUNNEL RELEASE Left 07/11/2013   Procedure: LEFT CARPAL TUNNEL RELEASE;  Surgeon: Tami Ribas, MD;  Location: [facility];  Service: Orthopedics;  Laterality: Left;   carpel tunnel  2005   right wrist   COLONOSCOPY     hysterectomy  2002   KNEE SURGERY Left    LAPAROSCOPIC GASTRIC BANDING  2010   LAPAROSCOPIC GASTRIC RESECTION N/A 12/28/2021   Procedure: LAPAROSCOPIC GASTRIC  BAND REMOVAL;  Surgeon: Quentin Ore, MD;  Location: [facility];  Service: General;  Laterality: N/A;   ROTATOR CUFF REPAIR Right    TOTAL MASTECTOMY Bilateral 07/17/2020   Procedure: BILATERAL MASTECTOMY;  Surgeon: Chevis Pretty III, MD;  Location: [facility];  Service: General;  Laterality: Bilateral;    Current Outpatient Medications  Medication Sig Dispense Refill   acetaminophen (TYLENOL) 500 MG tablet Take 2 tablets (1,000 mg total) by mouth 3 (three) times daily. 180 tablet PRN   Calcium Carb-Cholecalciferol (CALCIUM 600 + D PO) Take 1 tablet by mouth daily.     diphenhydrAMINE-zinc acetate (BENADRYL) cream Apply 1 Application topically 3 (three) times daily as needed for itching.     ezetimibe (ZETIA) 10 MG tablet TAKE ONE (1) TABLET BY MOUTH EVERY DAY 90 tablet 0   fenofibrate 160 MG tablet Take 1 tablet (160 mg total) by mouth daily. For cholesterol and triglyceride 90 tablet 3   fluticasone (FLONASE) 50 MCG/ACT nasal spray USE 1-2 SPRAYS IN EACH NOSTRIL DAILY AS NEEDED FOR ALLERGIES 16 g 2   furosemide (LASIX) 20 MG tablet Take 20 mg by mouth daily.     Multiple Vitamins-Minerals (MULTIVITAMIN PO) Take 1 tablet by mouth daily.     Omega 3-6-9 Fatty Acids (TRIPLE OMEGA-3-6-9) CAPS Take 2 capsules by mouth daily.     potassium chloride SA (KLOR-CON M) 20 MEQ tablet TAKE ONE (1) TABLET BY MOUTH EVERY DAY 90 tablet 0   Probiotic Product (PROBIOTIC DAILY PO) Take 1 capsule by mouth daily.     sertraline (ZOLOFT) 100 MG tablet TAKE ONE (1) TABLET BY MOUTH EVERY DAY 90 tablet 1   TURMERIC PO Take 1,200 mg by mouth daily.     No current facility-administered medications for this visit.   Allergies:  Lisinopril, Nsaids, Repatha [evolocumab], Statins, Sulfa antibiotics, and Latex   Social History: The patient  reports that she quit smoking about 22 years ago. Her smoking use included cigarettes. She has never used smokeless tobacco. She reports that she does not drink  alcohol and does not use drugs.   Family History: The patient's family history includes Brain cancer (age of onset: 40) in her cousin; Breast cancer (age of onset: 35) in her mother; Cervical cancer (age of onset: 107) in her sister; Colon cancer (age of onset: 16) in her cousin; Colon cancer (age of onset: 36) in her cousin; Colon polyps in her daughter; Heart attack in her father; Lung cancer (age of onset: 16) in her cousin; Lung cancer (age of onset: 60) in her cousin; Lung cancer (age of onset: 34) in her cousin; Melanoma (age of onset: 53) in her cousin; Pancreatic cancer (age of onset: 44) in her cousin; Uterine cancer (age of onset: 65) in her maternal aunt.   ROS:  Please see the history of present illness.  Otherwise, complete review of systems is positive for none.  All other systems are reviewed and negative.   Physical Exam: VS:  There were no vitals taken for this visit., BMI There is no height or weight on file to calculate BMI.  Wt Readings from Last 3 Encounters:  01/30/23 251 lb (113.9 kg)  12/05/22 254 lb 12.8 oz (115.6 kg)  11/16/22 250 lb (113.4 kg)    General: Patient appears comfortable at rest. HEENT: Conjunctiva and lids normal, oropharynx clear with moist mucosa. Neck: Supple, no elevated JVP or carotid bruits, no thyromegaly. Lungs: Clear to auscultation, nonlabored breathing at rest. Cardiac: Regular rate and rhythm, Grade 2-3 systolic murmur Abdomen: Soft, nontender, no hepatomegaly, bowel sounds present, no guarding or rebound. Extremities: No pitting edema, distal pulses 2+. Skin: Warm and dry. Musculoskeletal: No kyphosis. Neuropsychiatric: Alert and oriented x3, affect grossly appropriate.  ECG:  An ECG dated 12/15/2021 was personally reviewed today and demonstrated:  NSR  Recent Labwork: 08/16/2022: TSH 2.270 01/27/2023: ALT 13; AST 19; BUN 14; Creatinine, Ser 0.61; Hemoglobin 14.3; Platelets 248; Potassium 3.8; Sodium 141     Component Value Date/Time    CHOL 184 01/27/2023 0854   TRIG 155 (H) 01/27/2023 0854   TRIG 231 (H) 12/19/2013 1318   HDL 43 01/27/2023 0854   HDL 47 12/19/2013 1318   CHOLHDL 4.3 01/27/2023 0854   LDLCALC 114 (H) 01/27/2023 0854   LDLCALC 127 (H) 12/19/2013 1318    Other Studies Reviewed Today: Stress test from 2019 No evidence of ischemia  Assessment and Plan:  HLD, nearly at goal: Intolerant to statins, does not meet criteria for bempedoic acid due to absence of FH or her insurance.  Did not tolerate Repatha due to arthralgia.  Doing okay with Zetia 10 mg once daily and fenofibrate 160 mg once daily.  Currently also taking fish oil supplements.  I reviewed her lipid panel from October 2024 that showed TG 158, mildly elevated and at goal.  LDL was 114, nearly at goal (compared to 132 previously).  Strongly recommended to start exercise which can decrease her LDL levels even more.  Will need to repeat lipid panel in the next 3 months.  Can be done by PCP.  If she continues to have elevated LDL compared to previous numbers, will need to start Praluent.  Palpitations: I reviewed her smart watch EKG strips that showed frequent PACs.  She has ongoing palpitations, feels her heart skipping a beat almost daily.  Will obtain 2-week event monitor to quantify the burden of PAC.  Mild aortic valve stenosis in 2024: Asymptomatic.  Repeat echocardiogram every 3 to 5 years.  Bilateral lower extremity pain, PAD was ruled out: ABI was normal.    Medication Adjustments/Labs and Tests Ordered: Current medicines are reviewed at length with the patient today.  Concerns regarding medicines are outlined above.   Tests Ordered: Orders Placed This Encounter  Procedures   EKG 12-Lead    Medication Changes: No orders of the defined types were placed in this encounter.   Disposition:  Follow up 1 year  Signed Luretta Everly Verne Spurr, MD, 06/26/2023 10:51 AM    [facility] at [facility] [address]  [place], [place], [place] [address]

## 2023-06-26 NOTE — Patient Instructions (Addendum)
 Medication Instructions:  Your physician recommends that you continue on your current medications as directed. Please refer to the Current Medication list given to you today.   Labwork: None   Testing/Procedures: Your physician has recommended that you wear a Zio monitor.   This monitor is a medical device that records the heart's electrical activity. Doctors most often use these monitors to diagnose arrhythmias. Arrhythmias are problems with the speed or rhythm of the heartbeat. The monitor is a small device applied to your chest. You can wear one while you do your normal daily activities. While wearing this monitor if you have any symptoms to push the button and record what you felt. Once you have worn this monitor for the period of time provider prescribed (for 14 days), you will return the monitor device in the postage paid box. Once it is returned they will download the data collected and provide [place] with a report which the provider will then review and we will call you with those results. Important tips:  Avoid showering during the first 24 hours of wearing the monitor. Avoid excessive sweating to help maximize wear time. Do not submerge the device, no hot tubs, and no swimming pools. Keep any lotions or oils away from the patch. After 24 hours you may shower with the patch on. Take brief showers with your back facing the shower head.  Do not remove patch once it has been placed because that will interrupt data and decrease adhesive wear time. Push the button when you have any symptoms and write down what you were feeling. Once you have completed wearing your monitor, remove and place into box which has postage paid and place in your outgoing mailbox. Remove on 07/10/2023 11:30 If for some reason you have misplaced your box then call our office and we can provide another box and/or mail it off for you.   Follow-Up: Your physician recommends that you schedule a follow-up appointment in: 1  year. You will receive a reminder call in about 8 months reminding you to schedule your appointment. If you don't receive this call, please contact our office.   Thank you for choosing [facility]!       If you need a refill on your cardiac medications before your next appointment, please call your pharmacy.

## 2023-07-04 ENCOUNTER — Telehealth: Admitting: Physician Assistant

## 2023-07-04 DIAGNOSIS — B3731 Acute candidiasis of vulva and vagina: Secondary | ICD-10-CM | POA: Diagnosis not present

## 2023-07-04 MED ORDER — FLUCONAZOLE 150 MG PO TABS: 150.0000 mg | ORAL_TABLET | Freq: Once | ORAL | 0 refills | Status: AC

## 2023-07-04 NOTE — Progress Notes (Signed)

## 2023-07-11 DIAGNOSIS — M47812 Spondylosis without myelopathy or radiculopathy, cervical region: Secondary | ICD-10-CM | POA: Diagnosis not present

## 2023-07-11 DIAGNOSIS — M47816 Spondylosis without myelopathy or radiculopathy, lumbar region: Secondary | ICD-10-CM | POA: Diagnosis not present

## 2023-07-11 DIAGNOSIS — M546 Pain in thoracic spine: Secondary | ICD-10-CM | POA: Diagnosis not present

## 2023-07-14 ENCOUNTER — Telehealth: Admitting: Physician Assistant

## 2023-07-14 DIAGNOSIS — B9689 Other specified bacterial agents as the cause of diseases classified elsewhere: Secondary | ICD-10-CM

## 2023-07-14 DIAGNOSIS — T3695XA Adverse effect of unspecified systemic antibiotic, initial encounter: Secondary | ICD-10-CM

## 2023-07-14 DIAGNOSIS — B379 Candidiasis, unspecified: Secondary | ICD-10-CM | POA: Diagnosis not present

## 2023-07-14 DIAGNOSIS — J019 Acute sinusitis, unspecified: Secondary | ICD-10-CM

## 2023-07-14 MED ORDER — FLUCONAZOLE 150 MG PO TABS: 150.0000 mg | ORAL_TABLET | ORAL | 0 refills | Status: DC | PRN

## 2023-07-14 MED ORDER — AMOXICILLIN-POT CLAVULANATE 875-125 MG PO TABS: 1.0000 | ORAL_TABLET | Freq: Two times a day (BID) | ORAL | 0 refills | Status: DC

## 2023-07-14 NOTE — Progress Notes (Signed)

## 2023-07-17 DIAGNOSIS — I491 Atrial premature depolarization: Secondary | ICD-10-CM | POA: Diagnosis not present

## 2023-07-18 NOTE — Telephone Encounter (Signed)
 Error

## 2023-07-27 DIAGNOSIS — D485 Neoplasm of uncertain behavior of skin: Secondary | ICD-10-CM | POA: Diagnosis not present

## 2023-08-01 ENCOUNTER — Ambulatory Visit: Payer: Medicare Other | Admitting: Family Medicine

## 2023-08-03 DIAGNOSIS — C44622 Squamous cell carcinoma of skin of right upper limb, including shoulder: Secondary | ICD-10-CM | POA: Diagnosis not present

## 2023-08-07 ENCOUNTER — Ambulatory Visit: Admitting: Family Medicine

## 2023-08-15 DIAGNOSIS — M546 Pain in thoracic spine: Secondary | ICD-10-CM | POA: Diagnosis not present

## 2023-08-15 DIAGNOSIS — M47812 Spondylosis without myelopathy or radiculopathy, cervical region: Secondary | ICD-10-CM | POA: Diagnosis not present

## 2023-08-15 DIAGNOSIS — M47816 Spondylosis without myelopathy or radiculopathy, lumbar region: Secondary | ICD-10-CM | POA: Diagnosis not present

## 2023-08-16 ENCOUNTER — Encounter: Payer: Self-pay | Admitting: Family Medicine

## 2023-08-16 ENCOUNTER — Ambulatory Visit (INDEPENDENT_AMBULATORY_CARE_PROVIDER_SITE_OTHER): Admitting: Family Medicine

## 2023-08-16 VITALS — BP 162/86 | HR 74 | Temp 98.4°F | Ht 65.0 in | Wt 245.0 lb

## 2023-08-16 DIAGNOSIS — Z789 Other specified health status: Secondary | ICD-10-CM | POA: Diagnosis not present

## 2023-08-16 DIAGNOSIS — F331 Major depressive disorder, recurrent, moderate: Secondary | ICD-10-CM

## 2023-08-16 DIAGNOSIS — E785 Hyperlipidemia, unspecified: Secondary | ICD-10-CM | POA: Diagnosis not present

## 2023-08-16 DIAGNOSIS — I119 Hypertensive heart disease without heart failure: Secondary | ICD-10-CM

## 2023-08-16 LAB — LIPID PANEL

## 2023-08-16 MED ORDER — SERTRALINE HCL 50 MG PO TABS: 75.0000 mg | ORAL_TABLET | Freq: Every day | ORAL | 1 refills | Status: DC

## 2023-08-16 MED ORDER — EZETIMIBE 10 MG PO TABS: 10.0000 mg | ORAL_TABLET | Freq: Every day | ORAL | 0 refills | Status: DC

## 2023-08-16 NOTE — Progress Notes (Signed)
 Subjective:  Patient ID: Bridget Conway, female    DOB: 02-24-1956  Age: 68 y.o. MRN: [id number]  CC: Medical Management of Chronic Issues (No concerns at this time. )   HPI Bridget Conway presents for depression. Doing the best she ever has. Wonders if she still needs zoloft .  Willing to try the first up of the taper.  PHQ noted below and reviewed with the patient.     08/16/2023   10:29 AM 01/30/2023   10:57 AM 12/05/2022   11:30 AM  Depression screen PHQ 2/9  Decreased Interest 0 0 0  Down, Depressed, Hopeless 0 0 0  PHQ - 2 Score 0 0 0  Altered sleeping 0    Tired, decreased energy 0    Change in appetite 0    Feeling bad or failure about yourself  0    Trouble concentrating 0    Moving slowly or fidgety/restless 0    Suicidal thoughts 0    PHQ-9 Score 0    Difficult doing work/chores Not difficult at all      Muscle aches with fenofibrate . Dced after 2 months and the ahes went. Away.  She continues to take the Zetia .  She cannot tolerate statins due to past reactions.   Follow-up of hypertension. Patient has no history of headache chest pain or shortness of breath or recent cough. Patient also denies symptoms of TIA such as numbness weakness lateralizing. Patient checks  blood pressure at home and has not had any elevated readings recently. Patient denies side effects from his medication. States taking it regularly.   History Janiyia has a past medical history of Allergy, Anxiety, Arthritis, Atypical ductal hyperplasia of both breasts (07/17/2020), Atypical ductal hyperplasia of right breast, Cancer (HCC), Depression, Family history of brain cancer, Family history of breast cancer, Family history of cervical cancer, Family history of colon cancer, Family history of lung cancer, Family history of melanoma, Family history of pancreatic cancer, Family history of uterine cancer, GERD (gastroesophageal reflux disease), Gross hematuria (02/25/2018), Heart murmur, History of  kidney stones, History of nonmelanoma skin cancer, Hypertension, IFG (impaired fasting glucose) (2014), Osteoporosis, Pre-diabetes, 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   She has a past surgical history that includes carpel tunnel (2005); hysterectomy (2002); Laparoscopic gastric banding (2010); Colonoscopy; Carpal tunnel release (Left, 07/11/2013); Knee surgery (Left); Abdominal hysterectomy; Breast lumpectomy with radioactive seed localization (Right, 12/27/2019); Total mastectomy (Bilateral, 07/17/2020); Laparoscopic gastric resection (N/A, 12/28/2021); Rotator cuff repair (Right); and skin spots removed (2025).   Her family history includes Brain cancer (age of onset: 26) in her cousin; Breast cancer (age of onset: 55) in her mother; Cervical cancer (age of onset: 3) in her sister; Colon cancer (age of onset: 56) in her cousin; Colon cancer (age of onset: 29) in her cousin; Colon polyps in her daughter; Heart attack in her father; Lung cancer (age of onset: 62) in her cousin; Lung cancer (age of onset: 7) in her cousin; Lung cancer (age of onset: 1) in her cousin; Melanoma (age of onset: 53) in her cousin; Pancreatic cancer (age of onset: 29) in her cousin; Uterine cancer (age of onset: 4) in her maternal aunt.She reports that she quit smoking about 23 years ago. Her smoking use included cigarettes. She has never used smokeless tobacco. She reports that she does not drink alcohol and does not use drugs.    ROS Review of Systems  Constitutional: Negative.   HENT:  Negative.    Eyes:  Negative for visual disturbance.  Respiratory:  Positive for cough. Negative for shortness of breath.   Cardiovascular:  Negative for chest pain.  Gastrointestinal:  Negative for abdominal pain.  Musculoskeletal:  Negative for arthralgias.    Objective:  BP (!) 162/86   Pulse 74   Temp 98.4 F (36.9 C)   Ht 5\' 5"  (1.651 m)   Wt 245 lb  (111.1 kg)   SpO2 95%   BMI 40.77 kg/m   BP Readings from Last 3 Encounters:  08/16/23 (!) 162/86  06/26/23 138/80  01/30/23 133/70    Wt Readings from Last 3 Encounters:  08/16/23 245 lb (111.1 kg)  06/26/23 248 lb 9.6 oz (112.8 kg)  01/30/23 251 lb (113.9 kg)     Physical Exam Constitutional:      General: She is not in acute distress.    Appearance: She is well-developed.  Cardiovascular:     Rate and Rhythm: Normal rate and regular rhythm.  Pulmonary:     Breath sounds: Normal breath sounds.  Musculoskeletal:        General: Normal range of motion.  Skin:    General: Skin is warm and dry.  Neurological:     Mental Status: She is alert and oriented to person, place, and time.      Assessment & Plan:  Statin intolerance -     Lipid panel  Moderate episode of recurrent major depressive disorder (HCC) -     Sertraline  HCl; Take 1.5 tablets (75 mg total) by mouth daily.  Dispense: 135 tablet; Refill: 1  Hypertension with heart disease -     CMP14+EGFR -     CBC with Differential/Platelet -     Lipid panel  Hyperlipidemia, unspecified hyperlipidemia type -     CMP14+EGFR -     CBC with Differential/Platelet -     Lipid panel  Other orders -     Ezetimibe ; Take 1 tablet (10 mg total) by mouth daily.  Dispense: 90 tablet; Refill: 0     Follow-up: Return in about 6 months (around 02/16/2024).  Roise Cleaver, M.D.

## 2023-08-17 LAB — CMP14+EGFR
ALT: 15 IU/L (ref 0–32)
AST: 17 IU/L (ref 0–40)
Albumin: 4.1 g/dL (ref 3.9–4.9)
Alkaline Phosphatase: 74 IU/L (ref 44–121)
BUN/Creatinine Ratio: 25 (ref 12–28)
BUN: 19 mg/dL (ref 8–27)
Bilirubin Total: 0.3 mg/dL (ref 0.0–1.2)
CO2: 28 mmol/L (ref 20–29)
Calcium: 9.3 mg/dL (ref 8.7–10.3)
Chloride: 100 mmol/L (ref 96–106)
Creatinine, Ser: 0.77 mg/dL (ref 0.57–1.00)
Globulin, Total: 3 g/dL (ref 1.5–4.5)
Glucose: 95 mg/dL (ref 70–99)
Potassium: 5 mmol/L (ref 3.5–5.2)
Sodium: 141 mmol/L (ref 134–144)
Total Protein: 7.1 g/dL (ref 6.0–8.5)
eGFR: 83 mL/min/{1.73_m2} (ref >59)

## 2023-08-17 LAB — LIPID PANEL
Cholesterol, Total: 193 mg/dL (ref 100–199)
HDL: 43 mg/dL (ref >39)
LDL CALC COMMENT:: 4.5 ratio — ABNORMAL HIGH (ref 0.0–4.4)
LDL Chol Calc (NIH): 117 mg/dL — ABNORMAL HIGH (ref 0–99)
Triglycerides: 190 mg/dL — ABNORMAL HIGH (ref 0–149)
VLDL Cholesterol Cal: 33 mg/dL (ref 5–40)

## 2023-08-17 LAB — CBC WITH DIFFERENTIAL/PLATELET
Basophils Absolute: 0.1 10*3/uL (ref 0.0–0.2)
Basos: 1 %
EOS (ABSOLUTE): 0.1 10*3/uL (ref 0.0–0.4)
Eos: 2 %
Hematocrit: 42.7 % (ref 34.0–46.6)
Hemoglobin: 14.1 g/dL (ref 11.1–15.9)
Immature Grans (Abs): 0 10*3/uL (ref 0.0–0.1)
Immature Granulocytes: 0 %
Lymphocytes Absolute: 2.3 10*3/uL (ref 0.7–3.1)
Lymphs: 33 %
MCH: 31.8 pg (ref 26.6–33.0)
MCHC: 33 g/dL (ref 31.5–35.7)
MCV: 96 fL (ref 79–97)
Monocytes Absolute: 0.6 10*3/uL (ref 0.1–0.9)
Monocytes: 8 %
Neutrophils Absolute: 3.9 10*3/uL (ref 1.4–7.0)
Neutrophils: 56 %
Platelets: 254 10*3/uL (ref 150–450)
RBC: 4.43 x10E6/uL (ref 3.77–5.28)
RDW: 12.1 % (ref 11.7–15.4)
WBC: 7 10*3/uL (ref 3.4–10.8)

## 2023-08-22 ENCOUNTER — Ambulatory Visit: Payer: Self-pay

## 2023-08-25 DIAGNOSIS — I491 Atrial premature depolarization: Secondary | ICD-10-CM

## 2023-09-09 DIAGNOSIS — M5459 Other low back pain: Secondary | ICD-10-CM | POA: Diagnosis not present

## 2023-09-09 DIAGNOSIS — M545 Low back pain, unspecified: Secondary | ICD-10-CM | POA: Insufficient documentation

## 2023-09-12 ENCOUNTER — Encounter: Payer: Self-pay | Admitting: Family Medicine

## 2023-09-13 ENCOUNTER — Ambulatory Visit: Payer: Medicare Other

## 2023-09-13 ENCOUNTER — Encounter: Payer: Self-pay | Admitting: Family Medicine

## 2023-09-13 ENCOUNTER — Other Ambulatory Visit: Payer: Self-pay | Admitting: Internal Medicine

## 2023-09-13 ENCOUNTER — Telehealth: Admitting: Family Medicine

## 2023-09-13 VITALS — BP 162/86 | HR 74 | Ht 65.0 in | Wt 245.0 lb

## 2023-09-13 DIAGNOSIS — Z0000 Encounter for general adult medical examination without abnormal findings: Secondary | ICD-10-CM | POA: Diagnosis not present

## 2023-09-13 DIAGNOSIS — M5416 Radiculopathy, lumbar region: Secondary | ICD-10-CM

## 2023-09-13 MED ORDER — OXYCODONE HCL 5 MG PO TABS: 5.0000 mg | ORAL_TABLET | ORAL | 0 refills | Status: DC | PRN

## 2023-09-13 MED ORDER — PREGABALIN 75 MG PO CAPS: 75.0000 mg | ORAL_CAPSULE | Freq: Every day | ORAL | 1 refills | Status: DC

## 2023-09-13 NOTE — Progress Notes (Signed)
 Subjective:  Patient ID: Bridget Conway, female    DOB: 02-10-1956  Age: 68 y.o. MRN: [id number]  CC: No chief complaint on file.   HPI Bridget Conway presents for back problems. Seeing chiropractor. Severe pain 5 days ago. Went to UC and got pred & methocarb. Used to see Dr. Patero. Never had surgery, though. Wants [facility]. Had XR that showed narrowing of the discs of L3-5. "Worse than a kidney stone." No relief with Ibuprofen pred & metho. Pain at lower lumbar region on left, radiating to posterolateral left thigh      09/13/2023   11:01 AM 08/16/2023   10:29 AM 01/30/2023   10:57 AM  Depression screen PHQ 2/9  Decreased Interest 0 0 0  Down, Depressed, Hopeless 0 0 0  PHQ - 2 Score 0 0 0  Altered sleeping 0 0   Tired, decreased energy 0 0   Change in appetite 0 0   Feeling bad or failure about yourself  0 0   Trouble concentrating 0 0   Moving slowly or fidgety/restless 0 0   Suicidal thoughts 0 0   PHQ-9 Score 0 0   Difficult doing work/chores Not difficult at all Not difficult at all     History Bridget Conway has a past medical history of Allergy, Anxiety, Arthritis, Atypical ductal hyperplasia of both breasts (07/17/2020), Atypical ductal hyperplasia of right breast, Cancer (HCC), Depression, Family history of brain cancer, Family history of breast cancer, Family history of cervical cancer, Family history of colon cancer, Family history of lung cancer, Family history of melanoma, Family history of pancreatic cancer, Family history of uterine cancer, GERD (gastroesophageal reflux disease), Gross hematuria (02/25/2018), Heart murmur, History of kidney stones, History of nonmelanoma skin cancer, Hyperlipidemia, Hypertension, IFG (impaired fasting glucose) (2014), Osteoporosis, Pre-diabetes, 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   She has a past surgical history that includes carpel tunnel (2005);  hysterectomy (2002); Laparoscopic gastric banding (2010); Colonoscopy; Carpal tunnel release (Left, 07/11/2013); Knee surgery (Left); Abdominal hysterectomy; Breast lumpectomy with radioactive seed localization (Right, 12/27/2019); Total mastectomy (Bilateral, 07/17/2020); Laparoscopic gastric resection (N/A, 12/28/2021); Rotator cuff repair (Right); skin spots removed (2025); and Breast surgery.   Her family history includes Alcohol abuse in her father; Arthritis in her mother; Asthma in her sister; Brain cancer (age of onset: 77) in her cousin; Breast cancer (age of onset: 35) in her mother; COPD in her sister; Cancer in her sister; Cervical cancer (age of onset: 110) in her sister; Colon cancer (age of onset: 33) in her cousin; Colon cancer (age of onset: 80) in her cousin; Colon polyps in her daughter; Depression in her sister and sister; Diabetes in her daughter; Drug abuse in her brother; Early death in her father, mother, and sister; Heart attack in her father; Heart disease in her father and sister; Hyperlipidemia in her sister; Hypertension in her sister; Lung cancer (age of onset: 38) in her cousin; Lung cancer (age of onset: 72) in her cousin; Lung cancer (age of onset: 66) in her cousin; Melanoma (age of onset: 53) in her cousin; Obesity in her daughter and sister; Pancreatic cancer (age of onset: 26) in her cousin; Stroke in her sister; Uterine cancer (age of onset: 53) in her maternal aunt; Vision loss in her sister.She reports that she quit smoking about 23 years ago. Her smoking use included cigarettes. She has a 15 pack-year smoking history. She has never used smokeless tobacco. She  reports that she does not drink alcohol and does not use drugs.    ROS Review of Systems  Constitutional: Negative.   HENT: Negative.    Eyes:  Negative for visual disturbance.  Respiratory:  Negative for shortness of breath.   Cardiovascular:  Negative for chest pain.  Gastrointestinal:  Negative for  abdominal pain.  Musculoskeletal:  Positive for back pain. Negative for arthralgias.    Objective:  There were no vitals taken for this visit.  BP Readings from Last 3 Encounters:  09/13/23 (!) 162/86  08/16/23 (!) 162/86  06/26/23 138/80    Wt Readings from Last 3 Encounters:  09/13/23 245 lb (111.1 kg)  08/16/23 245 lb (111.1 kg)  06/26/23 248 lb 9.6 oz (112.8 kg)     Physical Exam Exam deferred. Pt. Harboring due to COVID 19. Phone visit performed.   Assessment & Plan:  Acute left lumbar radiculopathy -     Ambulatory referral to Neurosurgery  Other orders -     Pregabalin ; Take 1 capsule (75 mg total) by mouth at bedtime.  Dispense: 90 capsule; Refill: 1 -     oxyCODONE  HCl; Take 1 tablet (5 mg total) by mouth every 4 (four) hours as needed for severe pain (pain score 7-10).  Dispense: 20 tablet; Refill: 0  Virtual Visit via telephone Note  I discussed the limitations, risks, security and privacy concerns of performing an evaluation and management service by telephone and the availability of in person appointments. I also discussed with the patient that there may be a patient responsible charge related to this service. The patient expressed understanding and agreed to proceed. Pt. Is at home. Dr. Veleta Gerold is in his office.  Follow Up Instructions:   I discussed the assessment and treatment plan with the patient. The patient was provided an opportunity to ask questions and all were answered. The patient agreed with the plan and demonstrated an understanding of the instructions.   The patient was advised to call back or seek an in-person evaluation if the symptoms worsen or if the condition fails to improve as anticipated.  Total minutes including chart review and phone contact time: 21    Follow-up: Return if symptoms worsen or fail to improve.  Roise Cleaver, M.D.

## 2023-09-13 NOTE — Patient Instructions (Signed)
 Bridget Conway , Thank you for taking time out of your busy schedule to complete your Annual Wellness Visit with me. I enjoyed our conversation and look forward to speaking with you again next year. I, as well as your care team,  appreciate your ongoing commitment to your health goals. Please review the following plan we discussed and let me know if I can assist you in the future. Your Game plan/ To Do List    Follow up Visits: Next Medicare AWV with our clinical staff: 09/13/24 at 10:40a.m.    Next Office Visit with your provider: 02/15/24 at 11:20a.m.  Clinician Recommendations:  Aim for 30 minutes of exercise or brisk walking, 6-8 glasses of water, and 5 servings of fruits and vegetables each day.       This is a list of the screening recommended for you and due dates:  Health Maintenance  Topic Date Due   COVID-19 Vaccine (4 - 2024-25 season) 09/28/2024*   Flu Shot  11/10/2023   Yearly kidney health urinalysis for diabetes  01/27/2024   DEXA scan (bone density measurement)  05/09/2024   DTaP/Tdap/Td vaccine (2 - Td or Tdap) 06/05/2024   Yearly kidney function blood test for diabetes  08/15/2024   Medicare Annual Wellness Visit  09/12/2024   Colon Cancer Screening  11/16/2027   Pneumonia Vaccine  Completed   Hepatitis C Screening  Completed   Zoster (Shingles) Vaccine  Completed   HPV Vaccine  Aged Out   Meningitis B Vaccine  Aged Out   Complete foot exam   Discontinued   Hemoglobin A1C  Discontinued   Eye exam for diabetics  Discontinued  *Topic was postponed. The date shown is not the original due date.    Advanced directives: (Copy Requested) Please bring a copy of your health care power of attorney and living will to the office to be added to your chart at your convenience. You can mail to [facility] [address] [place], [place] [address] or email to [email or web address] Advance Care Planning is important because it:  [x]  Makes sure you receive the  medical care that is consistent with your values, goals, and preferences  [x]  It provides guidance to your family and loved ones and reduces their decisional burden about whether or not they are making the right decisions based on your wishes.  Follow the link provided in your after visit summary or read over the paperwork we have mailed to you to help you started getting your Advance Directives in place. If you need assistance in completing these, please reach out to us  so that we can help you!  See attachments for Preventive Care and Fall Prevention Tips.

## 2023-09-13 NOTE — Progress Notes (Signed)
 Subjective:   Bridget Conway is a 68 y.o. who presents for a Medicare Wellness preventive visit.  As a reminder, Annual Wellness Visits don't include a physical exam, and some assessments may be limited, especially if this visit is performed virtually. We may recommend an in-person follow-up visit with your provider if needed.  Visit Complete: Virtual I connected with  Bridget Conway on 09/13/23 by a audio enabled telemedicine application and verified that I am speaking with the correct person using two identifiers.  Patient Location: Home  Provider Location: Home Office  I discussed the limitations of evaluation and management by telemedicine. The patient expressed understanding and agreed to proceed.  Vital Signs: Because this visit was a virtual/telehealth visit, some criteria may be missing or patient reported. Any vitals not documented were not able to be obtained and vitals that have been documented are patient reported.  VideoDeclined- This patient declined Librarian, academic. Therefore the visit was completed with audio only.  Persons Participating in Visit: Patient.  AWV Questionnaire: Yes: Patient Medicare AWV questionnaire was completed by the patient on 09/09/23; I have confirmed that all information answered by patient is correct and no changes since this date.  Cardiac Risk Factors include: advanced age (>81men, >48 women);dyslipidemia;hypertension;obesity (BMI >30kg/m2);smoking/ tobacco exposure;Other (see comment), Risk factor comments: hypertension w/heart disease     Objective:     Today's Vitals   09/13/23 1051  BP: (!) 162/86  Pulse: 74  Weight: 245 lb (111.1 kg)  Height: 5\' 5"  (1.651 m)  PainSc: 10-Worst pain ever   Body mass index is 40.77 kg/m.     09/13/2023   10:57 AM 09/12/2022    2:57 PM 12/15/2021    9:52 AM 09/10/2021    2:52 PM 09/09/2020    3:12 PM 07/17/2020    7:39 AM 07/06/2020    2:36 PM  Advanced Directives   Does Patient Have a Medical Advance Directive? Yes Yes Yes Yes Yes Yes Yes  Type of Advance Directive Living will;Healthcare Power of [employer] Power of [place];Living will Healthcare Power of [place];Living will Healthcare Power of [place];Living will Healthcare Power of [place];Living will Healthcare Power of [place];Living will Healthcare Power of [place];Living will  Copy of Healthcare Power of Attorney in Chart?  No - copy requested No - copy requested No - copy requested No - copy requested No - copy requested     Current Medications (verified) Outpatient Encounter Medications as of 09/13/2023  Medication Sig   acetaminophen  (TYLENOL ) 500 MG tablet Take 2 tablets (1,000 mg total) by mouth 3 (three) times daily.   Calcium Carb-Cholecalciferol (CALCIUM 600 + D PO) Take 1 tablet by mouth daily.   diphenhydrAMINE -zinc acetate (BENADRYL ) cream Apply 1 Application topically 3 (three) times daily as needed for itching.   ezetimibe  (ZETIA ) 10 MG tablet Take 1 tablet (10 mg total) by mouth daily.   fluconazole  (DIFLUCAN ) 150 MG tablet Take 1 tablet (150 mg total) by mouth every 3 (three) days as needed.   fluticasone  (FLONASE ) 50 MCG/ACT nasal spray USE 1-2 SPRAYS IN EACH NOSTRIL DAILY AS NEEDED FOR ALLERGIES   furosemide  (LASIX ) 20 MG tablet Take 20 mg by mouth daily.   Multiple Vitamins-Minerals (MULTIVITAMIN PO) Take 1 tablet by mouth daily.   Omega 3-6-9 Fatty Acids (TRIPLE OMEGA-3-6-9) CAPS Take 2 capsules by mouth daily.   oxyCODONE  (ROXICODONE ) 5 MG immediate release tablet Take 1 tablet (5 mg total) by mouth every 4 (four) hours as needed for  severe pain (pain score 7-10).   potassium chloride  SA (KLOR-CON  M) 20 MEQ tablet TAKE ONE (1) TABLET BY MOUTH EVERY DAY   pregabalin  (LYRICA ) 75 MG capsule Take 1 capsule (75 mg total) by mouth at bedtime.   Probiotic Product (PROBIOTIC DAILY PO) Take 1 capsule by mouth daily.   sertraline  (ZOLOFT ) 50 MG tablet Take 1.5 tablets (75 mg  total) by mouth daily.   TURMERIC PO Take 1,200 mg by mouth daily.   No facility-administered encounter medications on file as of 09/13/2023.    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  [facility] working up     Heart murmur    History of kidney stones    History of nonmelanoma skin cancer    Hyperlipidemia    Hypertension    no meds, has white coat syndrome   IFG (impaired fasting glucose) 2014   Osteoporosis    Pre-diabetes    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 11/11/2019   Formatting of this note might be different from the original.  Last Assessment & Plan:   Formatting of this note might be different from the original.  Mammogram 10/10/2019: Indeterminate calcifications spanning 1.2 cm superior central right breast  Right breast biopsy 10/17/2019: Focal atypical ductal hyperplasia, microscopic intraductal papilloma with calcifications  12/27/2019: Right lumpectomy: Bridget Conway   Wears glasses    Wears partial dentures    upper partial   Past Surgical History:  Procedure Laterality Date   ABDOMINAL HYSTERECTOMY     BREAST LUMPECTOMY WITH RADIOACTIVE SEED LOCALIZATION Right 12/27/2019   Procedure: RIGHT BREAST LUMPECTOMY WITH RADIOACTIVE SEED  LOCALIZATION;  Surgeon: Bridget Chandler, MD;  Location: [facility];  Service: General;  Laterality: Right;   BREAST SURGERY     CARPAL TUNNEL RELEASE Left 07/11/2013   Procedure: LEFT CARPAL TUNNEL RELEASE;  Surgeon: Milagros Alf, MD;  Location: [facility];  Service: Orthopedics;  Laterality: Left;   carpel tunnel  2005   right wrist   COLONOSCOPY     hysterectomy  2002   KNEE SURGERY Left    LAPAROSCOPIC GASTRIC BANDING  2010   LAPAROSCOPIC GASTRIC RESECTION N/A 12/28/2021   Procedure: LAPAROSCOPIC GASTRIC BAND REMOVAL;  Surgeon: Junie Olds, MD;  Location: [facility];  Service: General;  Laterality: N/A;   ROTATOR CUFF REPAIR Right    skin spots removed  2025   TOTAL MASTECTOMY Bilateral 07/17/2020   Procedure: BILATERAL MASTECTOMY;  Surgeon: Bridget Chandler, MD;  Location: [facility];  Service: General;  Laterality: Bilateral;   Family History  Problem Relation Age of Onset  Breast cancer Mother 51   Arthritis Mother    Early death Mother    Heart attack Father    Alcohol abuse Father    Early death Father    Heart disease Father    Asthma Sister    Depression Sister    Early death Sister    Hyperlipidemia Sister    Hypertension Sister    Obesity Sister    Cervical cancer Sister 71   Cancer Sister    COPD Sister    Depression Sister    Heart disease Sister    Stroke Sister    Vision loss Sister    Drug abuse Brother    Uterine cancer Maternal Aunt 29   Melanoma Cousin 70       head (maternal first cousin)   Lung cancer Cousin 47       (paternal first cousin)   Colon cancer Cousin 34       (paternal first cousin)   Lung cancer Cousin 65       (paternal first cousin)   Colon cancer Cousin 40       (paternal first cousin)   Brain cancer Cousin 76       (paternal first cousin)   Lung cancer Cousin 21       (paternal first cousin)   Pancreatic cancer Cousin 20       (paternal first cousin)   Colon polyps Daughter     Diabetes Daughter    Obesity Daughter    Esophageal cancer Neg Hx    Rectal cancer Neg Hx    Stomach cancer Neg Hx    Social History   Socioeconomic History   Marital status: Divorced    Spouse name: Not on file   Number of children: 1   Years of education: Not on file   Highest education level: Some college, no degree  Occupational History   Occupation: retired  Tobacco Use   Smoking status: Former    Current packs/day: 0.00    Average packs/day: 1 pack/day for 15.0 years (15.0 ttl pk-yrs)    Types: Cigarettes    Quit date: 07/09/2000    Years since quitting: 23.1   Smokeless tobacco: Never   Tobacco comments:    I have been quit 20+ years  Vaping Use   Vaping status: Never Used  Substance and Sexual Activity   Alcohol use: Never   Drug use: Never   Sexual activity: Not Currently  Other Topics Concern   Not on file  Social History Narrative   Lives with significant other   Double mastectomy in 07/2020 - Dr Alethea Andes   Routine visits with Dr Lee Public, Oncologist   Social Drivers of Health   Financial Resource Strain: Low Risk  (09/13/2023)   Overall Financial Resource Strain (CARDIA)    Difficulty of Paying Living Expenses: Not hard at all  Food Insecurity: No Food Insecurity (09/13/2023)   Hunger Vital Sign    Worried About Running Out of Food in the Last Year: Never true    Ran Out of Food in the Last Year: Never true  Transportation Needs: No Transportation Needs (09/13/2023)   PRAPARE - Administrator, Civil Service (Medical): No    Lack of Transportation (Non-Medical): No  Physical Activity: Sufficiently Active (09/13/2023)   Exercise Vital Sign    Days of Exercise per Week: 7 days    Minutes of Exercise per Session: 30 min  Recent Concern: Physical Activity - Insufficiently Active (  08/09/2023)   Exercise Vital Sign    Days of Exercise per Week: 3 days    Minutes of Exercise per Session: 10 min  Stress: No Stress Concern Present (09/13/2023)   [employer] of Occupational Health - Occupational Stress Questionnaire    Feeling of Stress : Not at all  Social Connections: Socially Isolated (09/13/2023)   Social Connection and Isolation Panel [NHANES]    Frequency of Communication with Friends and Family: More than three times a week    Frequency of Social Gatherings with Friends and Family: More than three times a week    Attends Religious Services: Never    Database administrator or Organizations: No    Attends Engineer, structural: Never    Marital Status: Divorced    Tobacco Counseling Counseling given: Yes Tobacco comments: I have been quit 20+ years    Clinical Intake:  Pre-visit preparation completed: Yes  Pain : 0-10 (back pain) Pain Score: 10-Worst pain ever Pain Type: Chronic pain Pain Location: Back Pain Orientation: Lower Pain Descriptors / Indicators: Throbbing, Aching, Radiating Pain Onset: 1 to 4 weeks ago Pain Frequency: Constant Pain Relieving Factors: Rx pains Effect of Pain on Daily Activities: no  Pain Relieving Factors: Rx pains  BMI - recorded: 40.77 Nutritional Status: BMI > 30  Obese Nutritional Risks: None Diabetes: No  Lab Results  Component Value Date   HGBA1C 5.2 01/27/2023   HGBA1C 5.4 12/15/2021   HGBA1C 5.5 12/19/2013     How often do you need to have someone help you when you read instructions, pamphlets, or other written materials from your doctor or pharmacy?: 1 - Never  Interpreter Needed?: No  Information entered by :: Alia T/cma   Activities of Daily Living     09/09/2023    9:46 AM  In your present state of health, do you have any difficulty performing the following activities:  Hearing? 0  Vision? 0  Difficulty concentrating or making decisions? 0  Walking or climbing stairs? 1  Dressing or bathing? 0  Doing errands, shopping? 0  Preparing Food and eating ? N  Using the Toilet? N  In the past six months, have you accidently leaked urine? N  Do you have  problems with loss of bowel control? N  Managing your Medications? N  Managing your Finances? N  Housekeeping or managing your Housekeeping? N    Patient Care Team: Roise Cleaver, MD as PCP - General (Family Medicine) Mallipeddi, Kennyth Pean, MD as PCP - Cardiology (Cardiology) Cameron Cea, MD as Consulting Physician (Hematology and Oncology) Lorry Rouge, MD as Referring Physician (Dermatology)  I have updated your Care Teams any recent Medical Services you may have received from other providers in the past year.     Assessment:    This is a routine wellness examination for [facility]  Hearing/Vision screen Hearing Screening - Comments:: Pt denies hearing dif Vision Screening - Comments:: Pt denies vision w/glasses/pt [facility] to [facility] Last ov 12/24   Goals Addressed             This Visit's Progress    Patient Stated       To loose 25lbs       Depression Screen     09/13/2023   11:01 AM 08/16/2023   10:29 AM 01/30/2023   10:57 AM 12/05/2022   11:30 AM 10/24/2022    9:03 AM 09/22/2022   11:18 AM 09/12/2022    2:57 PM  PHQ 2/9  Scores  PHQ - 2 Score 0 0 0 0 0 0 0  PHQ- 9 Score 0 0     0    Fall Risk     09/09/2023    9:46 AM 01/30/2023   10:56 AM 12/05/2022   11:29 AM 10/24/2022    9:08 AM 10/24/2022    9:03 AM  Fall Risk   Falls in the past year? 0 1 1 1  0  Number falls in past yr:  0 0 0   Injury with Fall?  1 1 1    Risk for fall due to :  History of fall(s) History of fall(s) No Fall Risks   Follow up  Falls evaluation completed       MEDICARE RISK AT HOME:  Medicare Risk at Home Any stairs in or around the home?: (Patient-Rptd) No Home free of loose throw rugs in walkways, pet beds, electrical cords, etc?: (Patient-Rptd) Yes Adequate lighting in your home to reduce risk of falls?: (Patient-Rptd) Yes Life alert?: (Patient-Rptd) Yes Use of a cane, walker or w/c?: (Patient-Rptd) No Grab bars in the bathroom?: (Patient-Rptd) No Shower chair or  bench in shower?: (Patient-Rptd) Yes Elevated toilet seat or a handicapped toilet?: (Patient-Rptd) Yes  TIMED UP AND GO:  Was the test performed?  no  Cognitive Function: 6CIT completed        09/13/2023   11:02 AM 09/12/2022    2:58 PM 09/10/2021    2:56 PM 09/09/2020    3:13 PM  6CIT Screen  What Year? 0 points 0 points 0 points 0 points  What month? 0 points 0 points 0 points 0 points  What time? 0 points 0 points 0 points 0 points  Count back from 20 0 points 0 points 0 points 0 points  Months in reverse 0 points 0 points 0 points 0 points  Repeat phrase 0 points 0 points 2 points 0 points  Total Score 0 points 0 points 2 points 0 points    Immunizations Immunization History  Administered Date(s) Administered   Fluad Quad(high Dose 65+) 01/27/2020, 01/26/2021   Fluad Trivalent(High Dose 65+) 01/30/2023   Influenza Inj Mdck Quad Pf 01/25/2018   Influenza Split 01/29/2013   Influenza, High Dose Seasonal PF 01/02/2019   Influenza-Unspecified 01/10/2015   PFIZER(Purple Top)SARS-COV-2 Vaccination 04/26/2019, 05/17/2019, 01/07/2020   PNEUMOCOCCAL CONJUGATE-20 01/26/2021   Pneumococcal Polysaccharide-23 12/03/2014   Tdap 06/05/2014   Zoster Recombinant(Shingrix) 02/07/2017, 10/30/2017   Zoster, Live 11/20/2012, 01/09/2013    Screening Tests Health Maintenance  Topic Date Due   COVID-19 Vaccine (4 - 2024-25 season) 09/28/2024 (Originally 12/11/2022)   INFLUENZA VACCINE  11/10/2023   Diabetic kidney evaluation - Urine ACR  01/27/2024   DEXA SCAN  05/09/2024   DTaP/Tdap/Td (2 - Td or Tdap) 06/05/2024   Diabetic kidney evaluation - eGFR measurement  08/15/2024   Medicare Annual Wellness (AWV)  09/12/2024   Colonoscopy  11/16/2027   Pneumonia Vaccine 61+ Years old  Completed   Hepatitis C Screening  Completed   Zoster Vaccines- Shingrix  Completed   HPV VACCINES  Aged Out   Meningococcal B Vaccine  Aged Out   FOOT EXAM  Discontinued   HEMOGLOBIN A1C  Discontinued    OPHTHALMOLOGY EXAM  Discontinued    Health Maintenance  There are no preventive care reminders to display for this patient.  Health Maintenance Items Addressed: See Nurse Notes at the end of this note  Additional Screening:  Vision Screening: Recommended annual ophthalmology exams for early  detection of glaucoma and other disorders of the eye. Would you like a referral to an eye doctor? No    Dental Screening: Recommended annual dental exams for proper oral hygiene  Community Resource Referral / Chronic Care Management: CRR required this visit?  No   CCM required this visit?  No   Plan:    I have personally reviewed and noted the following in the patient's chart:   Medical and social history Use of alcohol, tobacco or illicit drugs  Current medications and supplements including opioid prescriptions. Patient is currently taking opioid prescriptions. Information provided to patient regarding non-opioid alternatives. Patient advised to discuss non-opioid treatment plan with their provider. Functional ability and status Nutritional status Physical activity Advanced directives List of other physicians Hospitalizations, surgeries, and ER visits in previous 12 months Vitals Screenings to include cognitive, depression, and falls Referrals and appointments  In addition, I have reviewed and discussed with patient certain preventive protocols, quality metrics, and best practice recommendations. A written personalized care plan for preventive services as well as general preventive health recommendations were provided to patient.   Michaelle Adolphus, CMA   09/13/2023   After Visit Summary: (MyChart) Due to this being a telephonic visit, the after visit summary with patients personalized plan was offered to patient via MyChart   Notes: Nothing significant to report at this time.

## 2023-09-14 ENCOUNTER — Other Ambulatory Visit: Payer: Self-pay

## 2023-09-14 ENCOUNTER — Encounter (HOSPITAL_BASED_OUTPATIENT_CLINIC_OR_DEPARTMENT_OTHER): Payer: Self-pay | Admitting: Emergency Medicine

## 2023-09-14 ENCOUNTER — Emergency Department (HOSPITAL_BASED_OUTPATIENT_CLINIC_OR_DEPARTMENT_OTHER)
Admission: EM | Admit: 2023-09-14 | Discharge: 2023-09-14 | Disposition: A | Discharge status: Home / Self Care | Attending: Emergency Medicine | Admitting: Emergency Medicine

## 2023-09-14 ENCOUNTER — Emergency Department (HOSPITAL_BASED_OUTPATIENT_CLINIC_OR_DEPARTMENT_OTHER)

## 2023-09-14 ENCOUNTER — Other Ambulatory Visit: Payer: Self-pay | Admitting: Family Medicine

## 2023-09-14 ENCOUNTER — Ambulatory Visit (HOSPITAL_BASED_OUTPATIENT_CLINIC_OR_DEPARTMENT_OTHER): Admitting: Student

## 2023-09-14 ENCOUNTER — Telehealth: Payer: Self-pay

## 2023-09-14 ENCOUNTER — Encounter (HOSPITAL_BASED_OUTPATIENT_CLINIC_OR_DEPARTMENT_OTHER): Payer: Self-pay | Admitting: Student

## 2023-09-14 DIAGNOSIS — Z91040 Latex allergy status: Secondary | ICD-10-CM | POA: Insufficient documentation

## 2023-09-14 DIAGNOSIS — M5442 Lumbago with sciatica, left side: Secondary | ICD-10-CM | POA: Diagnosis not present

## 2023-09-14 DIAGNOSIS — M545 Low back pain, unspecified: Secondary | ICD-10-CM | POA: Diagnosis not present

## 2023-09-14 DIAGNOSIS — M48061 Spinal stenosis, lumbar region without neurogenic claudication: Secondary | ICD-10-CM | POA: Diagnosis not present

## 2023-09-14 DIAGNOSIS — M5416 Radiculopathy, lumbar region: Secondary | ICD-10-CM

## 2023-09-14 DIAGNOSIS — M47816 Spondylosis without myelopathy or radiculopathy, lumbar region: Secondary | ICD-10-CM | POA: Diagnosis not present

## 2023-09-14 LAB — URINALYSIS, ROUTINE W REFLEX MICROSCOPIC
Bilirubin Urine: NEGATIVE
Glucose, UA: NEGATIVE mg/dL
Hgb urine dipstick: NEGATIVE
Ketones, ur: NEGATIVE mg/dL
Leukocytes,Ua: NEGATIVE
Nitrite: NEGATIVE
Protein, ur: NEGATIVE mg/dL
Specific Gravity, Urine: 1.023 (ref 1.005–1.030)
pH: 6.5 (ref 5.0–8.0)

## 2023-09-14 MED ORDER — HYDROMORPHONE HCL 1 MG/ML IJ SOLN
0.5000 mg | Freq: Once | INTRAMUSCULAR | Status: AC
  Administered 2023-09-14: 0.5 mg via INTRAVENOUS
  Filled 2023-09-14: qty 1

## 2023-09-14 MED ORDER — OXYCODONE HCL 5 MG PO TABS
5.0000 mg | ORAL_TABLET | Freq: Once | ORAL | Status: AC
  Administered 2023-09-14: 5 mg via ORAL
  Filled 2023-09-14: qty 1

## 2023-09-14 MED ORDER — ONDANSETRON HCL 4 MG/2ML IJ SOLN
4.0000 mg | Freq: Once | INTRAMUSCULAR | Status: AC
  Administered 2023-09-14: 4 mg via INTRAVENOUS
  Filled 2023-09-14: qty 2

## 2023-09-14 MED ORDER — PREGABALIN 75 MG PO CAPS: 75.0000 mg | ORAL_CAPSULE | Freq: Every day | ORAL | 0 refills | Status: DC

## 2023-09-14 NOTE — Telephone Encounter (Signed)
 LEFT DETAILED MESSAGE THAT MRI WAS ORDERED.

## 2023-09-14 NOTE — ED Notes (Signed)
 Pt aware of the need for a urine... Unable to currently provide the sample.Marland KitchenMarland Kitchen

## 2023-09-14 NOTE — ED Provider Notes (Signed)
  Physical Exam  BP (!) 155/81 (BP Location: Right Arm)   Pulse 73   Temp 98.3 F (36.8 C)   Resp 18   SpO2 97%   Physical Exam Vitals and nursing note reviewed.  Constitutional:      Appearance: Normal appearance.  HENT:     Head: Normocephalic and atraumatic.  Eyes:     General:        Right eye: No discharge.        Left eye: No discharge.     Conjunctiva/sclera: Conjunctivae normal.  Pulmonary:     Effort: Pulmonary effort is normal.  Skin:    General: Skin is warm and dry.     Findings: No rash.  Neurological:     General: No focal deficit present.     Mental Status: She is alert.  Psychiatric:        Mood and Affect: Mood normal.        Behavior: Behavior normal.     Procedures  Procedures  ED Course / MDM    Medical Decision Making Amount and/or Complexity of Data Reviewed Labs: ordered. Radiology: ordered.  Risk Prescription drug management.   Accepted handoff at shift change from [place] PA-C. Please see prior provider note for more detail.   Briefly: Patient is 68 y.o. female who presents to the emergency department today for further evaluation of low back pain and radiation into the left hip.  DDX: concern for radiculopathy  Plan: Pain control and discharge.  Gabapentin  sent to pharmacy.  Will follow-up with in the outpatient setting.  Patient's pain currently down to 0.  She is safe for discharge.              Angelyn Kennel [place], PA-C 09/14/23 1951    Scarlette Currier, MD 09/16/23 [phone]

## 2023-09-14 NOTE — ED Provider Notes (Signed)
 [facility] AT [facility] Provider Note   CSN: [id number] Arrival date & time: 09/14/23  1505     History  Chief Complaint  Patient presents with   Back Pain    Bridget Conway is a 68 y.o. female.  Patient with history of kidney stones --presents to the emergency department for evaluation of low back pain with radiation to the left hip.  Symptoms started about 1 week ago.  No injuries, falls, or other inciting events.  Pain was more mild and then progressed.  She was seen at urgent care/[facility] on 5/31 and prescribed methocarbamol  and steroids.  She had an x-ray at that time which suggested narrowing of the L3-L5 levels.  She had a video visit with PCP on 09/13/2023.  They were starting to arrange for MRI.  She was started on oxycodone  and pregabalin .  Thus far, those medications have not been helping.  Patient did follow-up with [facility] today.  They recommended following up with MRI.  They do not really have any further treatment recommendations at this point.  It was discussed the patient could come to the emergency department to potentially have CT done.  Her pain is severe, "worse than a kidney stone".  She states that she does not feel like she can continue like this.  She is ambulatory.  Driven to the hospital today by a friend.  Patient denies warning symptoms of back pain including: fecal incontinence, urinary retention or overflow incontinence, night sweats, waking from sleep with back pain, unexplained fevers or weight loss, h/o cancer, IVDU, recent trauma.          Home Medications Prior to Admission medications   Medication Sig Start Date End Date Taking? Authorizing Provider  acetaminophen  (TYLENOL ) 500 MG tablet Take 2 tablets (1,000 mg total) by mouth 3 (three) times daily. 01/30/23   Roise Cleaver, MD  Calcium Carb-Cholecalciferol (CALCIUM 600 + D PO) Take 1 tablet by mouth daily.    [provider]   diphenhydrAMINE -zinc acetate (BENADRYL ) cream Apply 1 Application topically 3 (three) times daily as needed for itching.    [provider]  ezetimibe  (ZETIA ) 10 MG tablet Take 1 tablet (10 mg total) by mouth daily. 08/16/23   Roise Cleaver, MD  fluconazole  (DIFLUCAN ) 150 MG tablet Take 1 tablet (150 mg total) by mouth every 3 (three) days as needed. 07/14/23   Angelia Kelp, PA-C  fluticasone  (FLONASE ) 50 MCG/ACT nasal spray USE 1-2 SPRAYS IN EACH NOSTRIL DAILY AS NEEDED FOR ALLERGIES 06/22/23   Jerrlyn Morel, NP  furosemide  (LASIX ) 20 MG tablet Take 20 mg by mouth daily.    [provider]  Multiple Vitamins-Minerals (MULTIVITAMIN PO) Take 1 tablet by mouth daily.    [provider]  Omega 3-6-9 Fatty Acids (TRIPLE OMEGA-3-6-9) CAPS Take 2 capsules by mouth daily.    [provider]  oxyCODONE  (ROXICODONE ) 5 MG immediate release tablet Take 1 tablet (5 mg total) by mouth every 4 (four) hours as needed for severe pain (pain score 7-10). 09/13/23   Roise Cleaver, MD  potassium chloride  SA (KLOR-CON  M) 20 MEQ tablet TAKE ONE (1) TABLET BY MOUTH EVERY DAY 09/13/23   Mallipeddi, Vishnu P, MD  pregabalin  (LYRICA ) 75 MG capsule Take 1 capsule (75 mg total) by mouth at bedtime. 09/13/23   Roise Cleaver, MD  Probiotic Product (PROBIOTIC DAILY PO) Take 1 capsule by mouth daily.    [provider]  sertraline  (ZOLOFT ) 50  MG tablet Take 1.5 tablets (75 mg total) by mouth daily. 08/16/23   Roise Cleaver, MD  TURMERIC PO Take 1,200 mg by mouth daily.    [provider]      Allergies    Lisinopril, Nsaids, Repatha  [evolocumab ], Statins, Sulfa antibiotics, and Latex    Review of Systems   Review of Systems  Physical Exam Updated Vital Signs BP (!) 182/94 (BP Location: Right Arm)   Pulse 75   Temp 98.3 F (36.8 C)   Resp 17   SpO2 95%  Physical Exam Vitals and nursing note reviewed.  Constitutional:      General: She is in acute distress.      Appearance: She is well-developed.     Comments: Patient appears uncomfortable, difficult for her to find a comfortable position.  HENT:     Head: Normocephalic and atraumatic.  Eyes:     Conjunctiva/sclera: Conjunctivae normal.  Pulmonary:     Effort: Pulmonary effort is normal.  Abdominal:     Palpations: Abdomen is soft.     Tenderness: There is no abdominal tenderness.  Musculoskeletal:        General: Normal range of motion.     Cervical back: Normal range of motion and neck supple. No tenderness or bony tenderness.     Thoracic back: No tenderness. Normal range of motion.     Lumbar back: Tenderness present. No bony tenderness.       Back:     Comments: No step-off noted with palpation of spine.  Patient has tenderness over the lower lumbar spine, left paraspinous musculature and into the hip.  Patient winces when I push in this area.  She has worsening pain with movement of the left hip.  Skin:    General: Skin is warm and dry.     Findings: No rash.  Neurological:     Mental Status: She is alert.     Sensory: No sensory deficit.     Deep Tendon Reflexes: Reflexes are normal and symmetric.     Comments: 5/5 strength in entire lower extremities bilaterally. No sensation deficit.      ED Results / Procedures / Treatments   Labs (all labs ordered are listed, but only abnormal results are displayed) Labs Reviewed  URINALYSIS, ROUTINE W REFLEX MICROSCOPIC    EKG None  Radiology CT Lumbar Spine Wo Contrast Result Date: 09/14/2023 CLINICAL DATA:  Low back pain radiating to left hip for 1 week EXAM: CT LUMBAR SPINE WITHOUT CONTRAST TECHNIQUE: Multidetector CT imaging of the lumbar spine was performed without intravenous contrast administration. Multiplanar CT image reconstructions were also generated. RADIATION DOSE REDUCTION: This exam was performed according to the departmental dose-optimization program which includes automated exposure control, adjustment of the mA and/or  kV according to patient size and/or use of iterative reconstruction technique. COMPARISON:  None Available. FINDINGS: Segmentation: 5 lumbar type vertebrae. Alignment: Normal. Vertebrae: Bones are osteopenic. There are no acute displaced fractures. No destructive bony abnormalities. Paraspinal and other soft tissues: The paraspinal soft tissues are unremarkable. Calcified gallstone is seen dependent within the gallbladder. No evidence of cholecystitis. Atherosclerosis of the aorta. Disc levels: Findings at individual levels are as follows: T12/L1: Mild bilateral facet hypertrophy and broad-based disc bulge without significant compressive sequela. L1/L2: Circumferential disc bulge and bilateral facet hypertrophy with left greater than right neural foraminal encroachment. L2/L3: Circumferential disc bulge with bilateral facet and ligamentum flavum hypertrophy. Mild central canal stenosis. Symmetrical bilateral neural foraminal encroachment. L3/L4: Circumferential disc  bulge with bilateral facet and ligamentum flavum hypertrophy. Moderate central canal stenosis. Symmetrical neural foraminal narrowing. L4/L5: Circumferential disc bulge with bilateral facet and ligamentum flavum hypertrophy. Moderate central canal stenosis. Symmetrical neural foraminal encroachment. L5/S1: Mild circumferential disc bulge and prominent bilateral facet hypertrophic changes. No significant compressive sequela. Reconstructed images demonstrate no additional findings. IMPRESSION: 1. No acute lumbar spine fracture. 2. Osteopenia. 3. Multilevel lumbar spondylosis and facet hypertrophy, most pronounced at L3-4 and L4-5. 4. Incidental cholelithiasis without evidence of cholecystitis. 5.  Aortic Atherosclerosis (ICD10-I70.0). Electronically Signed   By: Bobbye Burrow M.D.   On: 09/14/2023 17:25    Procedures Procedures    Medications Ordered in ED Medications  HYDROmorphone  (DILAUDID ) injection 0.5 mg (has no administration in time  range)  oxyCODONE  (Oxy IR/ROXICODONE ) immediate release tablet 5 mg (has no administration in time range)  HYDROmorphone  (DILAUDID ) injection 0.5 mg (0.5 mg Intravenous Given 09/14/23 1659)  ondansetron  (ZOFRAN ) injection 4 mg (4 mg Intravenous Given 09/14/23 1658)  HYDROmorphone  (DILAUDID ) injection 0.5 mg (0.5 mg Intravenous Given 09/14/23 1745)    ED Course/ Medical Decision Making/ A&P    Patient seen and examined. History obtained directly from patient.  Recent PCP and Ortho notes reviewed.  Labs/EKG: Ordered UA.  Imaging: Ordered CT lumbar spine  Medications/Fluids: Ordered: Hydromorphone  0.5 mg IV, Zofran  4 mg IV.   Most recent vital signs reviewed and are as follows: BP (!) 182/94 (BP Location: Right Arm)   Pulse 75   Temp 98.3 F (36.8 C)   Resp 17   SpO2 95%   Initial impression: Patient with lower back pain with radiation to the left hip, quite possibly radicular in nature.  Pain control has been very difficult for the patient thus far.  She is opioid nave.  Will attempt to better control her pain and obtain CT imaging of the lumbar spine for further evaluation.  Patient without red flags other than age.  5:40 PM Reassessment performed. Patient appears stable, still uncomfortable.  Imaging personally visualized and interpreted including: CT of the cervical spine, gree no obvious compression fractures.  Patient with degenerative disc disease at every level but without other concerning unexpected findings.  Reviewed pertinent lab work and imaging with patient at bedside. Questions answered.   Most current vital signs reviewed and are as follows: BP 139/71 (BP Location: Right Arm)   Pulse 72   Temp 98.3 F (36.8 C)   Resp 18   SpO2 97%   Plan: Will continue to work on pain control.  Will give additional dose of IV medication.  Patient tolerated the first dose well.  Reports only minimal improvement to this point.  6:47 PM Reassessment performed. Patient appears stable.   Reports pain from 10-7/10.  Reviewed pertinent lab work and imaging with patient at bedside. Questions answered.   Most current vital signs reviewed and are as follows: BP 139/71 (BP Location: Right Arm)   Pulse 72   Temp 98.3 F (36.8 C)   Resp 18   SpO2 97%   Plan: Will reduce IV pain medicine and give dose of oral pain medication.  Hopefully this will be sufficient.  Patient is making some improvement but still appears uncomfortable.  She is sitting up on the side of the bed now.  Patient was not not able to fill the pregabalin .  She states that it was not at her medication.  I can see that her primary care prescribed this to her.  It looks like they prescription may  have been printed instead of e-prescribed.  Will send this in for the patient so that she can try this as well given the neuropathic nature of the pain.  6:53 PM Signout to [employer] at shift change.   Plan for discharge to home if doing well after latest dose of pain medication.                                 Medical Decision Making Amount and/or Complexity of Data Reviewed Labs: ordered. Radiology: ordered.  Risk Prescription drug management.   Patient with low back pain with radiation to the hip, consistent with lumbar radiculopathy.  She has disease throughout her lumbar spine.  No compression fractures.  Patient does not have any symptoms of infection and low concern for epidural abscess or discitis.  She does not have lower extremity pain or swelling to suggest DVT.  No urinary symptoms to suggest UA or pyelo.  Attempt to control pain in ED.  Patient has appropriate follow-up.  I made sure that her pregabalin  was sent in for her.  She will continue outpatient follow-up.        Final Clinical Impression(s) / ED Diagnoses Final diagnoses:  Lumbar radiculopathy    Rx / DC Orders ED Discharge Orders          Ordered    pregabalin  (LYRICA ) 75 MG capsule  Daily at bedtime        09/14/23 1849               Lyna Sandhoff, PA-C 09/14/23 Delfin Fell, MD 09/16/23 [phone]

## 2023-09-14 NOTE — ED Notes (Signed)
 DC paperwork given and verbally understood.

## 2023-09-14 NOTE — Discharge Instructions (Signed)
 Please read and follow all provided instructions.  Your diagnoses today include:  1. Lumbar radiculopathy     Tests performed today include: Vital signs - see below for your results today CT scan of your lumbar spine -does not show any fractures, does show general disc disease throughout the lower spine  Medications prescribed:  Sent in pregabalin  prescription.   Take any prescribed medications only as directed.  Home care instructions:  Follow any educational materials contained in this packet Please rest, use ice or heat on your back for the next several days Do not lift, push, pull anything more than 10 pounds for the next week  Follow-up instructions: Please follow-up with your primary care provider in the next 1 week for further evaluation of your symptoms.   Return instructions:  SEEK IMMEDIATE MEDICAL ATTENTION IF YOU HAVE: New numbness, tingling, weakness, or problem with the use of your arms or legs Severe back pain not relieved with medications Loss control of your bowels or bladder Increasing pain in any areas of the body (such as chest or abdominal pain) Shortness of breath, dizziness, or fainting.  Worsening nausea (feeling sick to your stomach), vomiting, fever, or sweats Any other emergent concerns regarding your health   Additional Information:  Your vital signs today were: BP 139/71 (BP Location: Right Arm)   Pulse 72   Temp 98.3 F (36.8 C)   Resp 18   SpO2 97%  If your blood pressure (BP) was elevated above 135/85 this visit, please have this repeated by your doctor within one month. --------------

## 2023-09-14 NOTE — Telephone Encounter (Signed)
 Test ordered

## 2023-09-14 NOTE — Progress Notes (Signed)
 Chief Complaint: Low back pain     History of Present Illness:    Bridget Conway is a pleasant 68 y.o. female presenting to clinic today for evaluation of severe low back pain.  Pain began approximately 1 week ago without any injury or overuse.  She states that pain is unbearable and there are no positions that give her any relief which makes it particularly difficult to sleep.  Pain is located across the low back and radiates some down into the back of the left thigh.  She has been seen by [facility] about 5 days ago and received prednisone  and methocarbamol  without any relief.  Her PCP gave her oxycodone  5 mg which she states also brings zero relief.  An MRI of the lumbar spine was ordered earlier today.  Patient is also trialed ibuprofen 800 mg, ice, heat, Voltaren, and a TENS unit.  Denies any significant weakness, saddle anesthesia, or bowel/bladder dysfunction.   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  Last Assessment & Plan:   Formatting of this note might be different from the original.  Mammogram 10/10/2019: Indeterminate calcifications spanning 1.2 cm superior central right breast  Right breast biopsy 10/17/2019: Focal atypical ductal hyperplasia, microscopic intraductal papilloma with calcifications  12/27/2019: Right lumpectomy: [name]   Wears glasses    Wears partial dentures    upper partial   Past Surgical History:  Procedure Laterality Date   ABDOMINAL HYSTERECTOMY     BREAST LUMPECTOMY WITH RADIOACTIVE SEED LOCALIZATION Right 12/27/2019   Procedure: RIGHT BREAST LUMPECTOMY WITH RADIOACTIVE SEED LOCALIZATION;  Surgeon: Caralyn Chandler, MD;  Location: [facility];  Service: General;  Laterality: Right;   BREAST SURGERY     CARPAL TUNNEL RELEASE Left 07/11/2013   Procedure: LEFT CARPAL TUNNEL RELEASE;  Surgeon: Milagros Alf, MD;  Location: [facility];  Service: Orthopedics;  Laterality: Left;   carpel tunnel  2005   right wrist   COLONOSCOPY     hysterectomy  2002  KNEE SURGERY Left    LAPAROSCOPIC GASTRIC BANDING  2010   LAPAROSCOPIC GASTRIC RESECTION N/A 12/28/2021   Procedure: LAPAROSCOPIC GASTRIC BAND REMOVAL;  Surgeon: Junie Olds, MD;  Location: [facility];  Service: General;  Laterality: N/A;   ROTATOR CUFF REPAIR Right    skin spots removed  2025   TOTAL MASTECTOMY Bilateral 07/17/2020   Procedure: BILATERAL MASTECTOMY;  Surgeon: Caralyn Chandler, MD;  Location: [facility];  Service: General;  Laterality: Bilateral;   Social History   Socioeconomic History   Marital status: Divorced    Spouse name: Not on file   Number of children: 1   Years of education: Not on file   Highest education level: Some college, no degree  Occupational History   Occupation: retired  Tobacco Use   Smoking status:  Former    Current packs/day: 0.00    Average packs/day: 1 pack/day for 15.0 years (15.0 ttl pk-yrs)    Types: Cigarettes    Quit date: 07/09/2000    Years since quitting: 23.1   Smokeless tobacco: Never   Tobacco comments:    I have been quit 20+ years  Vaping Use   Vaping status: Never Used  Substance and Sexual Activity   Alcohol use: Never   Drug use: Never   Sexual activity: Not Currently  Other Topics Concern   Not on file  Social History Narrative   Lives with significant other   Double mastectomy in 07/2020 - Dr Alethea Andes   Routine visits with Dr Lee Public, Oncologist   Social Drivers of Health   Financial Resource Strain: Low Risk  (09/13/2023)   Overall Financial Resource Strain (CARDIA)    Difficulty of Paying Living Expenses: Not hard at all  Food Insecurity: No Food Insecurity (09/13/2023)   Hunger Vital Sign    Worried About Running Out of Food in the Last Year: Never true    Ran Out of Food in the Last Year: Never true  Transportation Needs: No Transportation Needs (09/13/2023)   PRAPARE - Administrator, Civil Service (Medical): No    Lack of Transportation (Non-Medical): No  Physical Activity: Sufficiently Active (09/13/2023)   Exercise Vital Sign    Days of Exercise per Week: 7 days    Minutes of Exercise per Session: 30 min  Recent Concern: Physical Activity - Insufficiently Active (08/09/2023)   Exercise Vital Sign    Days of Exercise per Week: 3 days    Minutes of Exercise per Session: 10 min  Stress: No Stress Concern Present (09/13/2023)   [employer] of Occupational Health - Occupational Stress Questionnaire    Feeling of Stress : Not at all  Social Connections: Socially Isolated (09/13/2023)   Social Connection and Isolation Panel [NHANES]    Frequency of Communication with Friends and Family: More than three times a week    Frequency of Social Gatherings with Friends and Family: More than three times a week    Attends Religious Services: Never     Database administrator or Organizations: No    Attends Banker Meetings: Never    Marital Status: Divorced   Family History  Problem Relation Age of Onset   Breast cancer Mother 27   Arthritis Mother    Early death Mother    Heart attack Father    Alcohol abuse Father    Early death Father    Heart disease Father    Asthma Sister    Depression Sister  Early death Sister    Hyperlipidemia Sister    Hypertension Sister    Obesity Sister    Cervical cancer Sister 3   Cancer Sister    COPD Sister    Depression Sister    Heart disease Sister    Stroke Sister    Vision loss Sister    Drug abuse Brother    Uterine cancer Maternal Aunt 84   Melanoma Cousin 70       head (maternal first cousin)   Lung cancer Cousin 51       (paternal first cousin)   Colon cancer Cousin 25       (paternal first cousin)   Lung cancer Cousin 31       (paternal first cousin)   Colon cancer Cousin 76       (paternal first cousin)   Brain cancer Cousin 54       (paternal first cousin)   Lung cancer Cousin 30       (paternal first cousin)   Pancreatic cancer Cousin 62       (paternal first cousin)   Colon polyps Daughter    Diabetes Daughter    Obesity Daughter    Esophageal cancer Neg Hx    Rectal cancer Neg Hx    Stomach cancer Neg Hx    Allergies  Allergen Reactions   Lisinopril Cough   Nsaids     Fluid retention    Repatha  [Evolocumab ]     arthralgia   Statins     Muscle pain   Sulfa Antibiotics Hives   Latex Rash   Current Outpatient Medications  Medication Sig Dispense Refill   acetaminophen  (TYLENOL ) 500 MG tablet Take 2 tablets (1,000 mg total) by mouth 3 (three) times daily. 180 tablet PRN   Calcium Carb-Cholecalciferol (CALCIUM 600 + D PO) Take 1 tablet by mouth daily.     diphenhydrAMINE -zinc acetate (BENADRYL ) cream Apply 1 Application topically 3 (three) times daily as needed for itching.     ezetimibe  (ZETIA ) 10 MG tablet Take 1 tablet (10 mg total) by  mouth daily. 90 tablet 0   fluconazole  (DIFLUCAN ) 150 MG tablet Take 1 tablet (150 mg total) by mouth every 3 (three) days as needed. 2 tablet 0   fluticasone  (FLONASE ) 50 MCG/ACT nasal spray USE 1-2 SPRAYS IN EACH NOSTRIL DAILY AS NEEDED FOR ALLERGIES 16 g 2   furosemide  (LASIX ) 20 MG tablet Take 20 mg by mouth daily.     Multiple Vitamins-Minerals (MULTIVITAMIN PO) Take 1 tablet by mouth daily.     Omega 3-6-9 Fatty Acids (TRIPLE OMEGA-3-6-9) CAPS Take 2 capsules by mouth daily.     oxyCODONE  (ROXICODONE ) 5 MG immediate release tablet Take 1 tablet (5 mg total) by mouth every 4 (four) hours as needed for severe pain (pain score 7-10). 20 tablet 0   potassium chloride  SA (KLOR-CON  M) 20 MEQ tablet TAKE ONE (1) TABLET BY MOUTH EVERY DAY 90 tablet 0   pregabalin  (LYRICA ) 75 MG capsule Take 1 capsule (75 mg total) by mouth at bedtime. 90 capsule 1   Probiotic Product (PROBIOTIC DAILY PO) Take 1 capsule by mouth daily.     sertraline  (ZOLOFT ) 50 MG tablet Take 1.5 tablets (75 mg total) by mouth daily. 135 tablet 1   TURMERIC PO Take 1,200 mg by mouth daily.     No current facility-administered medications for this visit.   No results found.  Review of Systems:   A ROS was performed including pertinent  positives and negatives as documented in the HPI.  Physical Exam :   Constitutional: NAD and appears stated age Neurological: Alert and oriented Psych: Appropriate affect and cooperative There were no vitals taken for this visit.   Comprehensive Musculoskeletal Exam:    Exam of the low back demonstrates tenderness throughout the midline of the lumbar spine extending into the left lumbar musculature radiating down into the posterior left hip.  Passive left hip range of motion to 100 degrees flexion and 20 degrees of internal and external rotation.  Bilateral knee flexion/extension, ankle dorsiflexion/plantarflexion, and EHL strength is 5/5.  Neurosensory exam to the lower extremities  intact.  Imaging:     Assessment:   68 y.o. female with 1 week history of severe low back pain radiating into the posterior left thigh.  Pain continues despite conservative treatments including oral prednisone , methocarbamol , ibuprofen, oxycodone , and heat/ice.  No cauda equina symptoms present.  She had x-rays completed at [facility] with no acute findings so this was not repeated today.  An MRI has been ordered by her PCP, although this appears to be of routine priority so I discussed that it would be worth following up to see if this can be performed stat.  If not I would be willing to put in a stat order for a lumbar MRI.  She is wanting to follow-up with [facility].  Discussed that this would be the main priority as there is not much else I can provide for pain or symptom control.  If symptoms continue to be severe without any relief, I would recommend evaluation in the emergency department.  This would provide better methods of pain control and can likely workup via CT scan if indicated to evaluate for possible causes.  Patient is in agreement with plan.  Plan :    - Obtain MRI of the lumbar spine     I personally saw and evaluated the patient, and participated in the management and treatment plan.  Sharrell Deck, PA-C Orthopedics

## 2023-09-14 NOTE — ED Triage Notes (Signed)
 Back pain into left hip x 1 weeks Started steroid and narc pain meds and OTC meds Mri is ordered but unable to schedule yet

## 2023-09-14 NOTE — Telephone Encounter (Signed)
 Pt is requesting mri   Copied from CRM [id number]. Topic: Clinical - Medical Advice >> Sep 14, 2023 11:40 AM Wynona Hedger wrote: Reason for CRM: Patient sent Dr. Veleta Gerold a message, she needs an Mri order at [facility]

## 2023-09-15 ENCOUNTER — Other Ambulatory Visit: Payer: Self-pay

## 2023-09-15 ENCOUNTER — Ambulatory Visit
Admission: RE | Admit: 2023-09-15 | Discharge: 2023-09-15 | Disposition: A | Discharge status: Home / Self Care | Source: Ambulatory Visit | Attending: Family Medicine | Admitting: Family Medicine

## 2023-09-15 ENCOUNTER — Ambulatory Visit: Payer: Self-pay | Admitting: Internal Medicine

## 2023-09-15 DIAGNOSIS — M5126 Other intervertebral disc displacement, lumbar region: Secondary | ICD-10-CM | POA: Diagnosis not present

## 2023-09-15 DIAGNOSIS — M5416 Radiculopathy, lumbar region: Secondary | ICD-10-CM

## 2023-09-15 DIAGNOSIS — M4316 Spondylolisthesis, lumbar region: Secondary | ICD-10-CM | POA: Diagnosis not present

## 2023-09-18 ENCOUNTER — Ambulatory Visit (INDEPENDENT_AMBULATORY_CARE_PROVIDER_SITE_OTHER): Admitting: Family Medicine

## 2023-09-18 ENCOUNTER — Encounter: Payer: Self-pay | Admitting: Family Medicine

## 2023-09-18 ENCOUNTER — Ambulatory Visit: Payer: Self-pay

## 2023-09-18 VITALS — BP 127/82 | HR 74 | Temp 98.3°F | Ht 65.0 in | Wt 247.0 lb

## 2023-09-18 DIAGNOSIS — M5416 Radiculopathy, lumbar region: Secondary | ICD-10-CM | POA: Diagnosis not present

## 2023-09-18 MED ORDER — KETOROLAC TROMETHAMINE 60 MG/2ML IM SOLN
60.0000 mg | Freq: Once | INTRAMUSCULAR | Status: AC
  Administered 2023-09-18: 60 mg via INTRAMUSCULAR

## 2023-09-18 MED ORDER — OXYCODONE-ACETAMINOPHEN 5-325 MG PO TABS: 1.0000 | ORAL_TABLET | ORAL | 0 refills | Status: AC | PRN

## 2023-09-18 NOTE — Telephone Encounter (Signed)
 Appointment made

## 2023-09-18 NOTE — Telephone Encounter (Signed)
 FYI Only or Action Required?: Action required by provider  Patient was last seen in primary care on 09/13/2023 by Roise Cleaver, MD. Called Nurse Triage reporting Back Pain. Symptoms began several weeks ago. Interventions attempted: Prescription medications: oxycodone . Symptoms are: gradually worsening.  Triage Disposition: See HCP Within 4 Hours (Or PCP Triage)  Patient/caregiver understands and will follow disposition?: YesCopied from CRM [id number]. Topic: Clinical - Red Word Triage >> Sep 18, 2023  8:25 AM Tisa Forester wrote: Red Word that prompted transfer to Nurse Triage: severe back pain ,legs or numb   went to Emergency Room on last thursday ,have mri on friday  waiting for mri result  patient call back number  [phone] Reason for Disposition  [1] SEVERE back pain (e.g., excruciating, unable to do any normal activities) AND [2] not improved 2 hours after pain medicine  Answer Assessment - Initial Assessment Questions 1. ONSET: "When did the pain begin?"      2 weeks ago  2. LOCATION: "Where does it hurt?" (upper, mid or lower back)     Lower back of left side  3. SEVERITY: "How bad is the pain?"  (e.g., Scale 1-10; mild, moderate, or severe)   - MILD (1-3): Doesn't interfere with normal activities.    - MODERATE (4-7): Interferes with normal activities or awakens from sleep.    - SEVERE (8-10): Excruciating pain, unable to do any normal activities.      10 4. PATTERN: "Is the pain constant?" (e.g., yes, no; constant, intermittent)      Constant  5. RADIATION: "Does the pain shoot into your legs or somewhere else?"     Yes, radiates to leg 6. CAUSE:  "What do you think is causing the back pain?"      Pinched nerve  7. BACK OVERUSE:  "Any recent lifting of heavy objects, strenuous work or exercise?"     Na  8. MEDICINES: "What have you taken so far for the pain?" (e.g., nothing, acetaminophen , NSAIDS)     Oxycodone   9. NEUROLOGIC SYMPTOMS: "Do you have any weakness, numbness, or  problems with bowel/bladder control?"     Numbness in left leg 10. OTHER SYMPTOMS: "Do you have any other symptoms?" (e.g., fever, abdomen pain, burning with urination, blood in urine)       Denies      Pt seen in ED last Thursday. MRI was Friday. Pt is wanting to be seen and to have more pain mediation ordered so she doesn't run out.  Protocols used: Back Pain-A-AH

## 2023-09-18 NOTE — Progress Notes (Signed)
 Subjective:  Patient ID: Bridget Conway, female    DOB: [birth year]  Age: 68 y.o. MRN: [id number]  CC: Back Pain (Started two weeks with lower back and left hip. No injury. Left leg numb and occasional foot numbness. Worse during the night and morning while laying down. Tens unit, hot and cold therapy, and  UC gave dilaudid , prednisone  and oxy but those are all out now. )   HPI Bridget Conway presents for left hip pain worse with left leg burning and numb onset yesterday.  Patient has tried multiple interventions for her pain.  She was given a legally limited supply of oxycodone  5 days ago.  She is in today stating that her pain is worse than before as noted above.  She needs a refill of the medication.  She is doing her best to cope with 10/10 pain.  She had an MRI but it has not been read as of today.  CT scan did show multiple areas of foraminal neural encroachment at 3 levels.     09/13/2023   11:01 AM 08/16/2023   10:29 AM 01/30/2023   10:57 AM  Depression screen PHQ 2/9  Decreased Interest 0 0 0  Down, Depressed, Hopeless 0 0 0  PHQ - 2 Score 0 0 0  Altered sleeping 0 0   Tired, decreased energy 0 0   Change in appetite 0 0   Feeling bad or failure about yourself  0 0   Trouble concentrating 0 0   Moving slowly or fidgety/restless 0 0   Suicidal thoughts 0 0   PHQ-9 Score 0 0   Difficult doing work/chores Not difficult at all Not difficult at all     History Bridget Conway has a past medical history of Allergy, Anxiety, Arthritis, Atypical ductal hyperplasia of both breasts (07/17/2020), Atypical ductal hyperplasia of right breast, Cancer (HCC), Depression, Family history of brain cancer, Family history of breast cancer, Family history of cervical cancer, Family history of colon cancer, Family history of lung cancer, Family history of melanoma, Family history of pancreatic cancer, Family history of uterine cancer, GERD (gastroesophageal reflux disease), Gross hematuria (02/25/2018),  Heart murmur, History of kidney stones, History of nonmelanoma skin cancer, Hyperlipidemia, Hypertension, IFG (impaired fasting glucose) (2014), Osteoporosis, Pre-diabetes, 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   She has a past surgical history that includes carpel tunnel (2005); hysterectomy (2002); Laparoscopic gastric banding (2010); Colonoscopy; Carpal tunnel release (Left, 07/11/2013); Knee surgery (Left); Abdominal hysterectomy; Breast lumpectomy with radioactive seed localization (Right, 12/27/2019); Total mastectomy (Bilateral, 07/17/2020); Laparoscopic gastric resection (N/A, 12/28/2021); Rotator cuff repair (Right); skin spots removed (2025); and Breast surgery.   Her family history includes Alcohol abuse in her father; Arthritis in her mother; Asthma in her sister; Brain cancer (age of onset: 39) in her cousin; Breast cancer (age of onset: 54) in her mother; COPD in her sister; Cancer in her sister; Cervical cancer (age of onset: 66) in her sister; Colon cancer (age of onset: 84) in her cousin; Colon cancer (age of onset: 104) in her cousin; Colon polyps in her daughter; Depression in her sister and sister; Diabetes in her daughter; Drug abuse in her brother; Early death in her father, mother, and sister; Heart attack in her father; Heart disease in her father and sister; Hyperlipidemia in her sister; Hypertension in her sister; Lung cancer (age of onset: 39) in her cousin; Lung cancer (age of onset: 51) in her cousin; Lung  cancer (age of onset: 40) in her cousin; Melanoma (age of onset: 81) in her cousin; Obesity in her daughter and sister; Pancreatic cancer (age of onset: 48) in her cousin; Stroke in her sister; Uterine cancer (age of onset: 32) in her maternal aunt; Vision loss in her sister.She reports that she quit smoking about 23 years ago. Her smoking use included cigarettes. She has a 15 pack-year smoking history. She has  never used smokeless tobacco. She reports that she does not drink alcohol and does not use drugs.    ROS Review of Systems  Constitutional: Negative.   HENT: Negative.    Eyes:  Negative for visual disturbance.  Respiratory:  Negative for shortness of breath.   Cardiovascular:  Negative for chest pain.  Gastrointestinal:  Negative for abdominal pain.  Musculoskeletal:  Negative for arthralgias.    Objective:  BP 127/82   Pulse 74   Temp 98.3 F (36.8 C)   Ht 5\' 5"  (1.651 m)   Wt 247 lb (112 kg)   SpO2 92%   BMI 41.10 kg/m   BP Readings from Last 3 Encounters:  09/18/23 127/82  09/14/23 (!) 141/69  09/13/23 (!) 162/86    Wt Readings from Last 3 Encounters:  09/18/23 247 lb (112 kg)  09/13/23 245 lb (111.1 kg)  08/16/23 245 lb (111.1 kg)     Physical Exam Constitutional:      General: She is not in acute distress.    Appearance: She is well-developed.  Cardiovascular:     Rate and Rhythm: Normal rate and regular rhythm.  Pulmonary:     Breath sounds: Normal breath sounds.  Musculoskeletal:        General: Normal range of motion.  Skin:    General: Skin is warm and dry.  Neurological:     Mental Status: She is alert and oriented to person, place, and time.      Assessment & Plan:  Lumbar radiculopathy, acute -     Ketorolac  Tromethamine   Other orders -     oxyCODONE -Acetaminophen ; Take 1 tablet by mouth every 4 (four) hours as needed for up to 14 days for severe pain (pain score 7-10).  Dispense: 60 tablet; Refill: 0     Follow-up: No follow-ups on file.  Roise Cleaver, M.D.

## 2023-09-28 ENCOUNTER — Ambulatory Visit: Payer: Self-pay | Admitting: Family Medicine

## 2023-10-02 ENCOUNTER — Ambulatory Visit: Admitting: Family Medicine

## 2023-10-02 DIAGNOSIS — Z6841 Body Mass Index (BMI) 40.0 and over, adult: Secondary | ICD-10-CM | POA: Diagnosis not present

## 2023-10-02 DIAGNOSIS — M5126 Other intervertebral disc displacement, lumbar region: Secondary | ICD-10-CM | POA: Diagnosis not present

## 2023-10-11 DIAGNOSIS — M5126 Other intervertebral disc displacement, lumbar region: Secondary | ICD-10-CM | POA: Diagnosis not present

## 2023-11-09 ENCOUNTER — Ambulatory Visit (HOSPITAL_BASED_OUTPATIENT_CLINIC_OR_DEPARTMENT_OTHER): Admitting: Physician Assistant

## 2023-11-09 ENCOUNTER — Encounter (HOSPITAL_BASED_OUTPATIENT_CLINIC_OR_DEPARTMENT_OTHER): Payer: Self-pay | Admitting: Physician Assistant

## 2023-11-09 ENCOUNTER — Ambulatory Visit (HOSPITAL_BASED_OUTPATIENT_CLINIC_OR_DEPARTMENT_OTHER)

## 2023-11-09 DIAGNOSIS — M85811 Other specified disorders of bone density and structure, right shoulder: Secondary | ICD-10-CM | POA: Diagnosis not present

## 2023-11-09 DIAGNOSIS — M19011 Primary osteoarthritis, right shoulder: Secondary | ICD-10-CM | POA: Diagnosis not present

## 2023-11-09 DIAGNOSIS — M25511 Pain in right shoulder: Secondary | ICD-10-CM

## 2023-11-09 DIAGNOSIS — M25521 Pain in right elbow: Secondary | ICD-10-CM | POA: Diagnosis not present

## 2023-11-09 NOTE — Progress Notes (Signed)
 Office Visit Note   Patient: Bridget Conway           Date of Birth: December 20, 1955           MRN: [id number] Visit Date: 11/09/2023              Requested by: Zollie Lowers, MD [address] [place],  [place] [address] PCP: Zollie Lowers, MD  Chief Complaint  Patient presents with   Right Shoulder - Pain      HPI: Patient is a 68 year old woman who comes in today 3 days status post fall onto her right side.  She has a history of a rotator cuff repair.  She is complaining of right shoulder pain and weakness.  She is 4 weeks status post effects lower back surgery.  Rotator cuff surgery was a year and a half ago she [place] it seems similar.  She has been taking Tylenol  ice  Assessment & Plan: Visit Diagnoses:  1. Acute pain of right shoulder     Plan: Bridget Conway is 3 days status post fall onto her right shoulder after she was trying to get her chickens back in their coop.  This was purely mechanical fall she did not have any predisposing factors.  She is a year and a half status post right rotator cuff repair with Dr. Evalene Chancy.  She is also 4 weeks status post lumbar discectomy with Dr. Gillie.  She is not complaining of any low back issues.  No paresthesias no real weakness her biggest complaint is her right shoulder and her right elbow.  She does have some limited motion secondary to pain however she does have fair resisted abduction and external/internal rotation.  She does not have any ecchymosis.  In her elbow she has good flexion and extension supination and pronation the only thing that reproduces her pain is triceps testing but her triceps is intact.  I talked her I thought a lot of this could just be contusions.  She does have a small bruising on the elbow.  No real evidence of radial head fracture.  I have asked that she treat this symptomatically the next couple weeks she can use Tylenol  ice alternating with heat.  Also topical Voltaren gel.  I instructed her which she has  done before and pendulum exercises and wall walking exercises so her shoulder does not get stiff.  Can follow-up with me if she wishes in 2 weeks  Follow-Up Instructions: Return in about 2 weeks (around 11/23/2023).   Ortho Exam  Patient is alert, oriented, no adenopathy, well-dressed, normal affect, normal respiratory effort. Examination of her right shoulder she has no bruising no deformity no erythema she has forward active elevation to about 100 degrees before she has pain.  She has limited internal rotation behind her back secondary to pain.  She has fair strength with resisted external rotation as well as abduction.  She has no deformity.  She is neurovascular intact has good grip strength she has good biceps and triceps strength though triceps resistance does cause a little pain in her posterior elbow.  Her elbow she has no tenderness over the radial head no pain with supination pronation extension and flexion.  She has a small amount of ecchymosis in the posterior elbow.  She is neurovascular intact    Imaging: No results found. No images are attached to the encounter.  Labs: Lab Results  Component Value Date   HGBA1C 5.2 01/27/2023   HGBA1C 5.4 12/15/2021  HGBA1C 5.5 12/19/2013     Lab Results  Component Value Date   ALBUMIN 4.1 08/16/2023   ALBUMIN 4.0 01/27/2023   ALBUMIN 3.7 (L) 10/21/2022    No results found for: MG Lab Results  Component Value Date   VD25OH 38.4 01/27/2023   VD25OH 39.7 08/16/2022   VD25OH 40.7 01/26/2022    No results found for: PREALBUMIN    Latest Ref Rng & Units 08/16/2023   11:13 AM 01/27/2023    8:54 AM 10/21/2022    8:37 AM  CBC EXTENDED  WBC 3.4 - 10.8 x10E3/uL 7.0  6.6  5.2   RBC 3.77 - 5.28 x10E6/uL 4.43  4.40  4.25   Hemoglobin 11.1 - 15.9 g/dL 85.8  85.6  86.4   HCT 34.0 - 46.6 % 42.7  42.7  40.9   Platelets 150 - 450 x10E3/uL 254  248  257   NEUT# 1.4 - 7.0 x10E3/uL 3.9  3.6  2.4   Lymph# 0.7 - 3.1 x10E3/uL 2.3  2.3  2.1       There is no height or weight on file to calculate BMI.  Orders:  Orders Placed This Encounter  Procedures   DG Shoulder Right   DG Elbow Complete Right   No orders of the defined types were placed in this encounter.    Procedures: No procedures performed  Clinical Data: No additional findings.  ROS:  All other systems negative, except as noted in the HPI. Review of Systems  Objective: Vital Signs: There were no vitals taken for this visit.  Specialty Comments:  No specialty comments available.  PMFS History: Patient Active Problem List   Diagnosis Date Noted   Low back pain 09/09/2023   Mild aortic valve stenosis 06/26/2023   Palpitations 06/26/2023   Arthralgia of multiple sites 09/22/2022   Statin intolerance 02/10/2022   Muscle cramps 02/10/2022   Heart murmur 02/10/2022   Chronic pain 09/09/2020   History of nonmelanoma skin cancer    Wart of face 02/03/2020   Mitral regurgitation 09/08/2018   Morbid obesity (HCC) 09/08/2018   HLD (hyperlipidemia) 06/06/2014   Hypertension with heart disease 12/19/2013   Hypokalemia 12/19/2013   GERD (gastroesophageal reflux disease) 12/19/2013   Depression, recurrent (HCC) 12/19/2013   Past Medical History:  Diagnosis Date   Allergy    Anxiety    Arthritis    Atypical ductal hyperplasia of both breasts 07/17/2020   Atypical ductal hyperplasia of right breast    Cancer (HCC)    basal cell CA- below eye   Depression    Family history of brain cancer    Family history of breast cancer    Family history of cervical cancer    Family history of colon cancer    Family history of lung cancer    Family history of melanoma    Family history of pancreatic cancer    Family history of uterine cancer    GERD (gastroesophageal reflux disease)    Gross hematuria 02/25/2018   Formatting of this note might be different from the original.  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  Last Assessment & Plan:   Formatting of this note might be different from the original.  Mammogram 10/10/2019: Indeterminate calcifications spanning 1.2 cm superior central right breast  Right breast biopsy 10/17/2019: Focal atypical ductal hyperplasia, microscopic intraductal papilloma with calcifications  12/27/2019: Right lumpectomy: [facility]   Wears glasses    Wears partial dentures    upper partial    Family History  Problem Relation Age of Onset   Breast cancer Mother 44   Arthritis Mother    Early death Mother    Heart attack Father    Alcohol abuse Father    Early death Father    Heart disease Father    Asthma Sister    Depression Sister    Early death Sister    Hyperlipidemia Sister    Hypertension Sister    Obesity Sister    Cervical cancer Sister 36   Cancer Sister    COPD Sister    Depression Sister    Heart disease Sister    Stroke Sister    Vision loss Sister    Drug abuse Brother    Uterine cancer Maternal Aunt 23   Melanoma Cousin 70       head (maternal first cousin)   Lung cancer Cousin 66       (paternal first cousin)   Colon cancer Cousin 78       (paternal first cousin)   Lung cancer Cousin 49       (paternal first cousin)   Colon cancer Cousin 68       (paternal first cousin)   Brain cancer Cousin 3       (paternal first cousin)   Lung cancer Cousin 7       (paternal first cousin)   Pancreatic cancer Cousin 21       (paternal first cousin)   Colon polyps Daughter    Diabetes Daughter    Obesity Daughter    Esophageal cancer Neg Hx    Rectal cancer Neg Hx    Stomach cancer Neg Hx     Past Surgical History:  Procedure Laterality Date    ABDOMINAL HYSTERECTOMY     BREAST LUMPECTOMY WITH RADIOACTIVE SEED LOCALIZATION Right 12/27/2019   Procedure: RIGHT BREAST LUMPECTOMY WITH RADIOACTIVE SEED LOCALIZATION;  Surgeon: Curvin Deward MOULD, MD;  Location: [facility];  Service: General;  Laterality: Right;   BREAST SURGERY     CARPAL TUNNEL RELEASE Left 07/11/2013   Procedure: LEFT CARPAL TUNNEL RELEASE;  Surgeon: Franky JONELLE Curia, MD;  Location: [facility];  Service: Orthopedics;  Laterality: Left;   carpel tunnel  2005   right wrist   COLONOSCOPY     hysterectomy  2002   KNEE SURGERY Left    LAPAROSCOPIC GASTRIC BANDING  2010   LAPAROSCOPIC GASTRIC RESECTION N/A 12/28/2021   Procedure: LAPAROSCOPIC GASTRIC BAND REMOVAL;  Surgeon: Lyndel Deward PARAS, MD;  Location: [facility];  Service: General;  Laterality: N/A;   ROTATOR CUFF REPAIR Right    skin spots removed  2025   TOTAL MASTECTOMY Bilateral 07/17/2020   Procedure: BILATERAL MASTECTOMY;  Surgeon: Curvin Deward MOULD, MD;  Location: [facility];  Service: General;  Laterality: Bilateral;   Social History   Occupational History   Occupation: retired  Tobacco Use   Smoking status: Former    Current packs/day: 0.00    Average packs/day: 1 pack/day for 15.0 years (15.0 ttl pk-yrs)  Types: Cigarettes    Quit date: 07/09/2000    Years since quitting: 23.3   Smokeless tobacco: Never   Tobacco comments:    I have been quit 20+ years  Vaping Use   Vaping status: Never Used  Substance and Sexual Activity   Alcohol use: Never   Drug use: Never   Sexual activity: Not Currently

## 2023-11-15 DIAGNOSIS — M546 Pain in thoracic spine: Secondary | ICD-10-CM | POA: Diagnosis not present

## 2023-11-15 DIAGNOSIS — M47812 Spondylosis without myelopathy or radiculopathy, cervical region: Secondary | ICD-10-CM | POA: Diagnosis not present

## 2023-11-15 DIAGNOSIS — M47816 Spondylosis without myelopathy or radiculopathy, lumbar region: Secondary | ICD-10-CM | POA: Diagnosis not present

## 2023-11-27 DIAGNOSIS — L570 Actinic keratosis: Secondary | ICD-10-CM | POA: Diagnosis not present

## 2023-11-27 DIAGNOSIS — L821 Other seborrheic keratosis: Secondary | ICD-10-CM | POA: Diagnosis not present

## 2023-11-27 DIAGNOSIS — Z85828 Personal history of other malignant neoplasm of skin: Secondary | ICD-10-CM | POA: Diagnosis not present

## 2023-11-27 DIAGNOSIS — C4441 Basal cell carcinoma of skin of scalp and neck: Secondary | ICD-10-CM | POA: Diagnosis not present

## 2023-11-27 DIAGNOSIS — L814 Other melanin hyperpigmentation: Secondary | ICD-10-CM | POA: Diagnosis not present

## 2023-11-27 DIAGNOSIS — D485 Neoplasm of uncertain behavior of skin: Secondary | ICD-10-CM | POA: Diagnosis not present

## 2023-12-01 ENCOUNTER — Encounter: Payer: Self-pay | Admitting: Internal Medicine

## 2023-12-01 ENCOUNTER — Other Ambulatory Visit: Payer: Self-pay | Admitting: Family Medicine

## 2023-12-01 ENCOUNTER — Other Ambulatory Visit: Payer: Self-pay | Admitting: Internal Medicine

## 2023-12-01 MED ORDER — POTASSIUM CHLORIDE CRYS ER 20 MEQ PO TBCR: 20.0000 meq | EXTENDED_RELEASE_TABLET | Freq: Every day | ORAL | 3 refills | Status: AC

## 2023-12-03 ENCOUNTER — Other Ambulatory Visit: Payer: Self-pay | Admitting: Family Medicine

## 2023-12-06 DIAGNOSIS — M546 Pain in thoracic spine: Secondary | ICD-10-CM | POA: Diagnosis not present

## 2023-12-06 DIAGNOSIS — M47816 Spondylosis without myelopathy or radiculopathy, lumbar region: Secondary | ICD-10-CM | POA: Diagnosis not present

## 2023-12-06 DIAGNOSIS — M47812 Spondylosis without myelopathy or radiculopathy, cervical region: Secondary | ICD-10-CM | POA: Diagnosis not present

## 2023-12-14 DIAGNOSIS — C441192 Basal cell carcinoma of skin of left lower eyelid, including canthus: Secondary | ICD-10-CM | POA: Diagnosis not present

## 2023-12-26 DIAGNOSIS — M859 Disorder of bone density and structure, unspecified: Secondary | ICD-10-CM | POA: Diagnosis not present

## 2023-12-26 DIAGNOSIS — M816 Localized osteoporosis [Lequesne]: Secondary | ICD-10-CM | POA: Diagnosis not present

## 2024-01-04 DIAGNOSIS — M859 Disorder of bone density and structure, unspecified: Secondary | ICD-10-CM | POA: Diagnosis not present

## 2024-01-04 DIAGNOSIS — M816 Localized osteoporosis [Lequesne]: Secondary | ICD-10-CM | POA: Diagnosis not present

## 2024-01-04 DIAGNOSIS — Z780 Asymptomatic menopausal state: Secondary | ICD-10-CM | POA: Diagnosis not present

## 2024-01-05 ENCOUNTER — Encounter

## 2024-01-06 ENCOUNTER — Telehealth: Admitting: Family Medicine

## 2024-01-06 DIAGNOSIS — U071 COVID-19: Secondary | ICD-10-CM

## 2024-01-06 MED ORDER — MOLNUPIRAVIR EUA 200MG CAPSULE: 4.0000 | ORAL_CAPSULE | Freq: Two times a day (BID) | ORAL | 0 refills | Status: AC

## 2024-01-06 NOTE — Progress Notes (Signed)
 Virtual Visit Consent   Bridget Conway, you are scheduled for a virtual visit with a [facility] provider today. Just as with appointments in the office, your consent must be obtained to participate. Your consent will be active for this visit and any virtual visit you may have with one of our providers in the next 365 days. If you have a MyChart account, a copy of this consent can be sent to you electronically.  As this is a virtual visit, video technology does not allow for your provider to perform a traditional examination. This may limit your provider's ability to fully assess your condition. If your provider identifies any concerns that need to be evaluated in person or the need to arrange testing (such as labs, EKG, etc.), we will make arrangements to do so. Although advances in technology are sophisticated, we cannot ensure that it will always work on either your end or our end. If the connection with a video visit is poor, the visit may have to be switched to a telephone visit. With either a video or telephone visit, we are not always able to ensure that we have a secure connection.  By engaging in this virtual visit, you consent to the provision of healthcare and authorize for your insurance to be billed (if applicable) for the services provided during this visit. Depending on your insurance coverage, you may receive a charge related to this service.  I need to obtain your verbal consent now. Are you willing to proceed with your visit today? Bridget Conway has provided verbal consent on 01/06/2024 for a virtual visit (video or telephone). Bridget Conway, [place]  Date: 01/06/2024 8:37 AM   Virtual Visit via Video Note   I, Bridget Conway, connected with  Bridget Conway  ([id number], June 25, 1955) on 01/06/24 at  8:30 AM EDT by a video-enabled telemedicine application and verified that I am speaking with the correct person using two identifiers.  Location: Patient: Virtual Visit Location  Patient: Home Provider: Virtual Visit Location Provider: Home Office   I discussed the limitations of evaluation and management by telemedicine and the availability of in person appointments. The patient expressed understanding and agreed to proceed.    History of Present Illness: Bridget Conway is a 68 y.o. who identifies as a female who was assigned female at birth, and is being seen today for c/o testing positive for COVID yesterday. Pt is requesting to be started on antiviral medications. Pt showing positive covid test, Pt states she has sore throat headache, body aches and congestion.  Fever yesterday 100.6.  Pt states this is her first time having covid. Pt states using cough drops and have been helpful.   HPI: HPI  Problems:  Patient Active Problem List   Diagnosis Date Noted   Low back pain 09/09/2023   Mild aortic valve stenosis 06/26/2023   Palpitations 06/26/2023   Arthralgia of multiple sites 09/22/2022   Statin intolerance 02/10/2022   Muscle cramps 02/10/2022   Heart murmur 02/10/2022   Chronic pain 09/09/2020   History of nonmelanoma skin cancer    Wart of face 02/03/2020   Mitral regurgitation 09/08/2018   Morbid obesity (HCC) 09/08/2018   HLD (hyperlipidemia) 06/06/2014   Hypertension with heart disease 12/19/2013   Hypokalemia 12/19/2013   GERD (gastroesophageal reflux disease) 12/19/2013   Depression, recurrent 12/19/2013    Allergies:  Allergies  Allergen Reactions   Lisinopril Cough   Nsaids     Fluid retention    Repatha  [  Evolocumab ]     arthralgia   Statins     Muscle pain   Sulfa Antibiotics Hives   Latex Rash   Medications:  Current Outpatient Medications:    molnupiravir EUA (LAGEVRIO) 200 mg CAPS capsule, Take 4 capsules (800 mg total) by mouth 2 (two) times daily for 5 days., Disp: 40 capsule, Rfl: 0   acetaminophen  (TYLENOL ) 500 MG tablet, Take 2 tablets (1,000 mg total) by mouth 3 (three) times daily., Disp: 180 tablet, Rfl: PRN    Calcium Carb-Cholecalciferol (CALCIUM 600 + D PO), Take 1 tablet by mouth daily., Disp: , Rfl:    diphenhydrAMINE -zinc acetate (BENADRYL ) cream, Apply 1 Application topically 3 (three) times daily as needed for itching., Disp: , Rfl:    ezetimibe  (ZETIA ) 10 MG tablet, TAKE ONE (1) TABLET BY MOUTH EVERY DAY, Disp: 90 tablet, Rfl: 0   fluconazole  (DIFLUCAN ) 150 MG tablet, Take 1 tablet (150 mg total) by mouth every 3 (three) days as needed., Disp: 2 tablet, Rfl: 0   fluticasone  (FLONASE ) 50 MCG/ACT nasal spray, USE 1-2 SPRAYS IN EACH NOSTRIL DAILY AS NEEDED FOR ALLERGIES, Disp: 16 g, Rfl: 2   furosemide  (LASIX ) 20 MG tablet, TAKE ONE (1) TABLET BY MOUTH TWO (2) TIMES DAILY, Disp: 180 tablet, Rfl: 0   Multiple Vitamins-Minerals (MULTIVITAMIN PO), Take 1 tablet by mouth daily., Disp: , Rfl:    Omega 3-6-9 Fatty Acids (TRIPLE OMEGA-3-6-9) CAPS, Take 2 capsules by mouth daily., Disp: , Rfl:    oxyCODONE  (ROXICODONE ) 5 MG immediate release tablet, Take 1 tablet (5 mg total) by mouth every 4 (four) hours as needed for severe pain (pain score 7-10)., Disp: 20 tablet, Rfl: 0   potassium chloride  SA (KLOR-CON  M) 20 MEQ tablet, Take 1 tablet (20 mEq total) by mouth daily., Disp: 90 tablet, Rfl: 3   Probiotic Product (PROBIOTIC DAILY PO), Take 1 capsule by mouth daily., Disp: , Rfl:    sertraline  (ZOLOFT ) 50 MG tablet, Take 1.5 tablets (75 mg total) by mouth daily., Disp: 135 tablet, Rfl: 1   TURMERIC PO, Take 1,200 mg by mouth daily., Disp: , Rfl:   Observations/Objective: Patient is well-developed, well-nourished in no acute distress.  Resting comfortably at home.  Head is normocephalic, atraumatic.  No labored breathing.  Speech is clear and coherent with logical content.  Patient is alert and oriented at baseline.    Assessment and Plan: 1. COVID (Primary) - molnupiravir EUA (LAGEVRIO) 200 mg CAPS capsule; Take 4 capsules (800 mg total) by mouth 2 (two) times daily for 5 days.  Dispense: 40 capsule;  Refill: 0  -Start Molnupiravir  -Pt to proceed to urgent care or emergency room for worsening symptoms.   Follow Up Instructions: I discussed the assessment and treatment plan with the patient. The patient was provided an opportunity to ask questions and all were answered. The patient agreed with the plan and demonstrated an understanding of the instructions.  A copy of instructions were sent to the patient via MyChart unless otherwise noted below.    The patient was advised to call back or seek an in-person evaluation if the symptoms worsen or if the condition fails to improve as anticipated.    Bridget Mater, PA-C

## 2024-01-06 NOTE — Patient Instructions (Signed)
 Bridget Conway, thank you for joining Roosvelt Mater, PA-C for today's virtual visit.  While this provider is not your primary care provider (PCP), if your PCP is located in our provider database this encounter information will be shared with them immediately following your visit.   A [facility] MyChart account gives you access to today's visit and all your visits, tests, and labs performed at [facility]  click here if you don't have a [facility] MyChart account or go to [email or web address]  Consent: (Patient) Bridget Conway provided verbal consent for this virtual visit at the beginning of the encounter.  Current Medications:  Current Outpatient Medications:    molnupiravir EUA (LAGEVRIO) 200 mg CAPS capsule, Take 4 capsules (800 mg total) by mouth 2 (two) times daily for 5 days., Disp: 40 capsule, Rfl: 0   acetaminophen  (TYLENOL ) 500 MG tablet, Take 2 tablets (1,000 mg total) by mouth 3 (three) times daily., Disp: 180 tablet, Rfl: PRN   Calcium Carb-Cholecalciferol (CALCIUM 600 + D PO), Take 1 tablet by mouth daily., Disp: , Rfl:    diphenhydrAMINE -zinc acetate (BENADRYL ) cream, Apply 1 Application topically 3 (three) times daily as needed for itching., Disp: , Rfl:    ezetimibe  (ZETIA ) 10 MG tablet, TAKE ONE (1) TABLET BY MOUTH EVERY DAY, Disp: 90 tablet, Rfl: 0   fluconazole  (DIFLUCAN ) 150 MG tablet, Take 1 tablet (150 mg total) by mouth every 3 (three) days as needed., Disp: 2 tablet, Rfl: 0   fluticasone  (FLONASE ) 50 MCG/ACT nasal spray, USE 1-2 SPRAYS IN EACH NOSTRIL DAILY AS NEEDED FOR ALLERGIES, Disp: 16 g, Rfl: 2   furosemide  (LASIX ) 20 MG tablet, TAKE ONE (1) TABLET BY MOUTH TWO (2) TIMES DAILY, Disp: 180 tablet, Rfl: 0   Multiple Vitamins-Minerals (MULTIVITAMIN PO), Take 1 tablet by mouth daily., Disp: , Rfl:    Omega 3-6-9 Fatty Acids (TRIPLE OMEGA-3-6-9) CAPS, Take 2 capsules by mouth daily., Disp: , Rfl:    oxyCODONE  (ROXICODONE ) 5 MG immediate release  tablet, Take 1 tablet (5 mg total) by mouth every 4 (four) hours as needed for severe pain (pain score 7-10)., Disp: 20 tablet, Rfl: 0   potassium chloride  SA (KLOR-CON  M) 20 MEQ tablet, Take 1 tablet (20 mEq total) by mouth daily., Disp: 90 tablet, Rfl: 3   Probiotic Product (PROBIOTIC DAILY PO), Take 1 capsule by mouth daily., Disp: , Rfl:    sertraline  (ZOLOFT ) 50 MG tablet, Take 1.5 tablets (75 mg total) by mouth daily., Disp: 135 tablet, Rfl: 1   TURMERIC PO, Take 1,200 mg by mouth daily., Disp: , Rfl:    Medications ordered in this encounter:  Meds ordered this encounter  Medications   molnupiravir EUA (LAGEVRIO) 200 mg CAPS capsule    Sig: Take 4 capsules (800 mg total) by mouth 2 (two) times daily for 5 days.    Dispense:  40 capsule    Refill:  0     *If you need refills on other medications prior to your next appointment, please contact your pharmacy*  Follow-Up: Call back or seek an in-person evaluation if the symptoms worsen or if the condition fails to improve as anticipated.  [facility] [phone]  Other Instructions COVID-19: What to Know COVID-19 is an infection caused by a virus called SARS-CoV-2. This type of virus is called a coronavirus. People with COVID-19 may: Have few to no symptoms. Have mild to moderate symptoms that affect their lungs and breathing. Get very sick. What are the  causes?  COVID-19 is caused by a virus. This virus may be in the air as droplets or on surfaces. It can spread from an infected person when they cough, sneeze, speak, sing, or breathe. You may become infected if: You breathe in the infected droplets in the air. You touch an object that has the virus on it. What increases the risk? You are at risk of getting COVID-19 if you have been around someone with the infection. You may be more likely to get very sick if: You are 8 years old or older. You have certain medical conditions, such as: Heart  disease. Diabetes. Long-term respiratory disease. Cancer. Pregnancy. You are immunocompromised. This means your body can't fight infections easily. You have a disability that makes it hard for you to move around, you have trouble moving, or you can't move at all. What are the signs or symptoms? People may have different symptoms from COVID-19. The symptoms can also be mild to very bad. They often show up in 5-6 days after being infected. But, they can take up to 14 days to appear. Common symptoms are: Cough. Feeling tired. New loss of taste or smell. Fever. Less common symptoms are: Sore throat. Headache. Body or muscle aches. Diarrhea. A skin rash or fingers or toes that are a different color than usual. Red or irritated eyes. Sometimes, COVID-19 does not cause symptoms. How is this diagnosed? COVID-19 can be diagnosed with tests done in the lab or at home. Fluid from your nose, mouth, or lungs will be used to check for the virus. How is this treated? Treatment for COVID-19 depends on how sick you are. Mild symptoms can be treated at home with rest, fluids, and over-the-counter medicines. very bad symptoms may be treated in a hospital intensive care unit (ICU). If you have symptoms and are at risk of getting very sick, you may be given a medicine that fights viruses. This medicine is called an antiviral. How is this prevented? To protect yourself from COVID-19: Know your risk factors. Get vaccinated. If your body can't fight infections easily, talk to your provider about treatment to help prevent COVID-19. Stay at least about 3 feet (1 meter) away from other people. Wear mask that fits well when: You can't stay at a distance from people. You're in a place with not a lot of air flow. Try to be in open spaces with good air flow when you are in public. Wash your hands often or use an alcohol-based hand sanitizer. Cover your nose and mouth when you cough or sneeze. If you think  you have COVID-19 or have been around someone who has it, stay home and away from other people as told by your provider or health officials. Where to find more information To learn more: Go to TonerPromos.no Click Health Topics. Type COVID-19 in the search box. Go to VisitDestination.com.br Click Health Topics. Then click All Topics. Type COVID-19 in the search box. Get help right away if: You have trouble breathing or get short of breath. You have pain or pressure in your chest. You're feeling confused. These symptoms may be an emergency. Get help right away. Call 911. Do not wait to see if the symptoms will go away. Do not drive yourself to the hospital. This information is not intended to replace advice given to you by your health care provider. Make sure you discuss any questions you have with your health care provider. Document Revised: 12/29/2022 Document Reviewed: 12/21/2022 Elsevier Patient Education  2025  Elsevier Inc.   If you have been instructed to have an in-person evaluation today at a local Urgent Care facility, please use the link below. It will take you to a list of all of our available [facility], including address, phone number and hours of operation. Please do not delay care.  [facility]  If you or a family member do not have a primary care provider, use the link below to schedule a visit and establish care. When you choose a [facility] primary care physician or advanced practice provider, you gain a long-term partner in health. Find a Primary Care Provider  Learn more about [facility]'s in-office and virtual care options: [facility] - Get Care Now

## 2024-01-11 ENCOUNTER — Other Ambulatory Visit: Payer: Self-pay | Admitting: Internal Medicine

## 2024-01-29 ENCOUNTER — Other Ambulatory Visit: Payer: Self-pay | Admitting: *Deleted

## 2024-01-29 ENCOUNTER — Telehealth: Payer: Self-pay | Admitting: Family Medicine

## 2024-01-29 DIAGNOSIS — I119 Hypertensive heart disease without heart failure: Secondary | ICD-10-CM

## 2024-01-29 DIAGNOSIS — E785 Hyperlipidemia, unspecified: Secondary | ICD-10-CM

## 2024-01-29 DIAGNOSIS — E559 Vitamin D deficiency, unspecified: Secondary | ICD-10-CM

## 2024-01-29 NOTE — Telephone Encounter (Signed)
 Patient has appt 11-6 with [facility] and she is coming 11-3 to do her labs. Please put orders in.

## 2024-01-29 NOTE — Telephone Encounter (Unsigned)
 Copied from CRM #[id number]. Topic: Appointments - Appointment Scheduling >> Jan 29, 2024  2:40 PM Geneva B wrote: Patient/patient representative is calling to schedule an appointment. Refer to attachments for appointment information.  Patient needs lab order

## 2024-01-29 NOTE — Telephone Encounter (Signed)
 Orders placed.

## 2024-02-12 ENCOUNTER — Other Ambulatory Visit: Payer: Self-pay

## 2024-02-12 ENCOUNTER — Encounter: Payer: Self-pay | Admitting: Radiology

## 2024-02-12 DIAGNOSIS — E785 Hyperlipidemia, unspecified: Secondary | ICD-10-CM | POA: Diagnosis not present

## 2024-02-12 DIAGNOSIS — I119 Hypertensive heart disease without heart failure: Secondary | ICD-10-CM | POA: Diagnosis not present

## 2024-02-12 LAB — CBC WITH DIFFERENTIAL/PLATELET

## 2024-02-12 LAB — LIPID PANEL

## 2024-02-13 ENCOUNTER — Ambulatory Visit: Payer: Self-pay | Admitting: Family Medicine

## 2024-02-13 LAB — CBC WITH DIFFERENTIAL/PLATELET
Basos: 1 %
EOS (ABSOLUTE): 0.1 x10E3/uL (ref 0.0–0.2)
Eos: 8 %
Hematocrit: 43.4 % (ref 34.0–46.6)
Hemoglobin: 14.2 g/dL (ref 11.1–15.9)
Immature Granulocytes: 0 %
Immature Granulocytes: 0 x10E3/uL (ref 0.0–0.1)
Lymphs: 32 %
MCH: 31.8 pg (ref 26.6–33.0)
MCHC: 32.7 g/dL (ref 31.5–35.7)
MCV: 97 fL (ref 79–97)
Monocytes Absolute: 0.5 x10E3/uL (ref 0.1–0.9)
Monocytes Absolute: 0.5 x10E3/uL — AB (ref 0.0–0.4)
Monocytes: 9 %
Neutrophils Absolute: 1.9 x10E3/uL (ref 0.7–3.1)
Neutrophils Absolute: 3 x10E3/uL (ref 1.4–7.0)
Neutrophils: 50 %
Platelets: 276 x10E3/uL (ref 150–450)
RBC: 4.46 x10E6/uL (ref 3.77–5.28)
RDW: 12.6 % (ref 11.7–15.4)
WBC: 6 x10E3/uL (ref 3.4–10.8)

## 2024-02-13 LAB — CMP14+EGFR
ALT: 13 IU/L (ref 0–32)
AST: 15 IU/L (ref 0–40)
Albumin: 3.8 g/dL — ABNORMAL LOW (ref 3.9–4.9)
Alkaline Phosphatase: 67 IU/L (ref 49–135)
BUN/Creatinine Ratio: 26 (ref 12–28)
BUN: 19 mg/dL (ref 8–27)
Bilirubin Total: 0.4 mg/dL (ref 0.0–1.2)
CO2: 25 mmol/L (ref 20–29)
Calcium: 8.8 mg/dL (ref 8.7–10.3)
Chloride: 100 mmol/L (ref 96–106)
Creatinine, Ser: 0.74 mg/dL (ref 0.57–1.00)
Globulin, Total: 3.2 g/dL (ref 1.5–4.5)
Glucose: 106 mg/dL — ABNORMAL HIGH (ref 70–99)
Potassium: 3.9 mmol/L (ref 3.5–5.2)
Sodium: 139 mmol/L (ref 134–144)
Total Protein: 7 g/dL (ref 6.0–8.5)
eGFR: 87 mL/min/1.73 (ref >59)

## 2024-02-13 LAB — LIPID PANEL
Cholesterol, Total: 198 mg/dL (ref 100–199)
HDL: 41 mg/dL (ref >39)
LDL CALC COMMENT:: 4.8 ratio — AB (ref 0.0–4.4)
LDL Chol Calc (NIH): 131 mg/dL — AB (ref 0–99)
Triglycerides: 143 mg/dL (ref 0–149)
VLDL Cholesterol Cal: 26 mg/dL (ref 5–40)

## 2024-02-15 ENCOUNTER — Ambulatory Visit: Payer: Self-pay | Admitting: Family Medicine

## 2024-02-16 ENCOUNTER — Other Ambulatory Visit: Payer: Self-pay | Admitting: Nurse Practitioner

## 2024-02-16 ENCOUNTER — Other Ambulatory Visit: Payer: Self-pay | Admitting: Family Medicine

## 2024-02-26 DIAGNOSIS — M25562 Pain in left knee: Secondary | ICD-10-CM | POA: Diagnosis not present

## 2024-02-26 DIAGNOSIS — G8929 Other chronic pain: Secondary | ICD-10-CM | POA: Diagnosis not present

## 2024-02-26 DIAGNOSIS — M25561 Pain in right knee: Secondary | ICD-10-CM | POA: Diagnosis not present

## 2024-02-26 DIAGNOSIS — M170 Bilateral primary osteoarthritis of knee: Secondary | ICD-10-CM | POA: Diagnosis not present

## 2024-02-27 ENCOUNTER — Encounter: Payer: Self-pay | Admitting: Family Medicine

## 2024-03-04 DIAGNOSIS — M170 Bilateral primary osteoarthritis of knee: Secondary | ICD-10-CM | POA: Diagnosis not present

## 2024-03-05 ENCOUNTER — Ambulatory Visit: Admitting: Family Medicine

## 2024-03-11 DIAGNOSIS — M170 Bilateral primary osteoarthritis of knee: Secondary | ICD-10-CM | POA: Diagnosis not present

## 2024-03-21 DIAGNOSIS — Z1330 Encounter for screening examination for mental health and behavioral disorders, unspecified: Secondary | ICD-10-CM | POA: Diagnosis not present

## 2024-03-21 DIAGNOSIS — F3342 Major depressive disorder, recurrent, in full remission: Secondary | ICD-10-CM | POA: Diagnosis not present

## 2024-03-21 DIAGNOSIS — I10 Essential (primary) hypertension: Secondary | ICD-10-CM | POA: Diagnosis not present

## 2024-03-21 DIAGNOSIS — E785 Hyperlipidemia, unspecified: Secondary | ICD-10-CM | POA: Diagnosis not present

## 2024-03-21 DIAGNOSIS — I119 Hypertensive heart disease without heart failure: Secondary | ICD-10-CM | POA: Diagnosis not present

## 2024-03-25 ENCOUNTER — Encounter (INDEPENDENT_AMBULATORY_CARE_PROVIDER_SITE_OTHER): Payer: Self-pay

## 2024-04-02 ENCOUNTER — Other Ambulatory Visit: Payer: Self-pay | Admitting: Family Medicine

## 2024-04-02 DIAGNOSIS — F331 Major depressive disorder, recurrent, moderate: Secondary | ICD-10-CM

## 2024-04-21 ENCOUNTER — Encounter (INDEPENDENT_AMBULATORY_CARE_PROVIDER_SITE_OTHER): Payer: Self-pay | Admitting: *Deleted

## 2024-04-22 ENCOUNTER — Institutional Professional Consult (permissible substitution) (INDEPENDENT_AMBULATORY_CARE_PROVIDER_SITE_OTHER): Admitting: Nurse Practitioner

## 2024-04-22 ENCOUNTER — Encounter (INDEPENDENT_AMBULATORY_CARE_PROVIDER_SITE_OTHER): Payer: Self-pay

## 2024-05-17 ENCOUNTER — Telehealth: Admitting: Student

## 2024-05-17 DIAGNOSIS — N760 Acute vaginitis: Secondary | ICD-10-CM

## 2024-05-17 MED ORDER — FLUCONAZOLE 150 MG PO TABS: 150.0000 mg | ORAL_TABLET | Freq: Every day | ORAL | 0 refills | Status: AC

## 2024-05-17 NOTE — Progress Notes (Signed)
 We are sorry that you are not feeling well. Here is how we plan to help! Based on what you shared with me it looks like you: May have a yeast vaginosis  Vaginosis is an inflammation of the vagina that can result in discharge, itching and pain. The cause is usually a change in the normal balance of vaginal bacteria or an infection. Vaginosis can also result from reduced estrogen levels after menopause.  The most common causes of vaginosis are:   Bacterial vaginosis which results from an overgrowth of one on several organisms that are normally present in your vagina.   Yeast infections which are caused by a naturally occurring fungus called candida.   Vaginal atrophy (atrophic vaginosis) which results from the thinning of the vagina from reduced estrogen levels after menopause.   Trichomoniasis which is caused by a parasite and is commonly transmitted by sexual intercourse.  Factors that increase your risk of developing vaginosis include: Medications, such as antibiotics and steroids Uncontrolled diabetes Use of hygiene products such as bubble bath, vaginal spray or vaginal deodorant Douching Wearing damp or tight-fitting clothing Using an intrauterine device (IUD) for birth control Hormonal changes, such as those associated with pregnancy, birth control pills or menopause Sexual activity Having a sexually transmitted infection  Your treatment plan is Diflucan . -For your yeast infection, start the Diflucan  (fluconazole )- Take one pill today (day 1). If you're still having symptoms in 3 days, take the second pill.   Creatinine clearance estimated to be 125 mL/min based on 02/2024 CMP, using the Cockcroft-Gault equation.   Be sure to take all of the medication as directed. Stop taking any medication if you develop a rash, tongue swelling or shortness of breath. Mothers who are breast feeding should consider pumping and discarding their breast milk while on these antibiotics. However, there  is no consensus that infant exposure at these doses would be harmful.  Remember that medication creams can weaken latex condoms.   HOME CARE:  Good hygiene may prevent some types of vaginosis from recurring and may relieve some symptoms:  Avoid baths, hot tubs and whirlpool spas. Rinse soap from your outer genital area after a shower, and dry the area well to prevent irritation. Don't use scented or harsh soaps, such as those with deodorant or antibacterial action. Avoid irritants. These include scented tampons and pads. Wipe from front to back after using the toilet. Doing so avoids spreading fecal bacteria to your vagina.  Other things that may help prevent vaginosis include:  Don't douche. Your vagina doesn't require cleansing other than normal bathing. Repetitive douching disrupts the normal organisms that reside in the vagina and can actually increase your risk of vaginal infection. Douching won't clear up a vaginal infection. Use a latex condom. Both female and female latex condoms may help you avoid infections spread by sexual contact. Wear cotton underwear. Also wear pantyhose with a cotton crotch. If you feel comfortable without it, skip wearing underwear to bed. Yeast thrives in [place] Your symptoms should improve in the next day or two.  GET HELP RIGHT AWAY IF:  You have pain in your lower abdomen ( pelvic area or over your ovaries) You develop nausea or vomiting You develop a fever Your discharge changes or worsens You have persistent pain with intercourse You develop shortness of breath, a rapid pulse, or you faint.  These symptoms could be signs of problems or infections that need to be evaluated by a medical provider now.  MAKE SURE YOU  Understand these instructions. Will watch your condition. Will get help right away if you are not doing well or get worse.  Your e-visit answers were reviewed by a board certified advanced clinical practitioner to  complete your personal care plan. Depending upon the condition, your plan could have included both over the counter or prescription medications. Please review your pharmacy choice to make sure that you have choses a pharmacy that is open for you to pick up any needed prescription, Your safety is important to us . If you have drug allergies check your prescription carefully.   You can use MyChart to ask questions about todays visit, request a non-urgent call back, or ask for a work or school excuse for 24 hours related to this e-Visit. If it has been greater than 24 hours you will need to follow up with your provider, or enter a new e-Visit to address those concerns. You will get a MyChart message within the next two days asking about your experience. I hope that your e-visit has been valuable and will speed your recovery.  I have spent 6 minutes in review of e-visit questionnaire, review and updating patient chart, medical decision making and response to patient.   Cristabel Bicknell E Brick Ketcher, PA-C

## 2024-06-03 ENCOUNTER — Institutional Professional Consult (permissible substitution) (INDEPENDENT_AMBULATORY_CARE_PROVIDER_SITE_OTHER): Admitting: Nurse Practitioner

## 2024-07-11 ENCOUNTER — Ambulatory Visit: Admitting: Internal Medicine
